# Patient Record
Sex: Female | Born: 1950 | Race: White | Hispanic: No | Marital: Married | Smoking: Never smoker
Health system: Southern US, Community
[De-identification: ages and names within clinical notes are randomized; demographics above are authoritative.]

## PROBLEM LIST (undated history)

## (undated) DIAGNOSIS — I10 Essential (primary) hypertension: Secondary | ICD-10-CM

## (undated) HISTORY — DX: Essential (primary) hypertension: I10

---

## 1998-12-04 ENCOUNTER — Emergency Department (HOSPITAL_COMMUNITY): Admission: EM | Admit: 1998-12-04 | Discharge: 1998-12-04 | Payer: Self-pay | Admitting: Emergency Medicine

## 2007-05-13 ENCOUNTER — Emergency Department (HOSPITAL_COMMUNITY): Admission: EM | Admit: 2007-05-13 | Discharge: 2007-05-13 | Payer: Self-pay | Admitting: Emergency Medicine

## 2007-06-17 ENCOUNTER — Ambulatory Visit: Payer: Self-pay

## 2007-06-17 ENCOUNTER — Encounter: Payer: Self-pay | Admitting: Cardiology

## 2010-12-27 NOTE — Assessment & Plan Note (Signed)
[facility]                            CARDIOLOGY OFFICE NOTE   Cindy, Figueroa                     MRN:          [id number]  DATE:06/10/2007                            DOB:          [birth year]    REFERRING PHYSICIAN:  Wilmon Arms. Tsuei, M.D.   REASON FOR CONSULTATION:  Preoperative evaluation.   HISTORY OF PRESENT ILLNESS:  Cindy Figueroa is a 60 year old woman  previously followed by Dr. Nicki Guadalajara with [facility]. She states she was last seen in 2005.  I do not have any of  her cardiac records as yet, but she states that she had a minor heart  attack back in 2000 while visiting in [place], [place]. She states  that she underwent a cardiac catheterization at that time and reportedly  had no significant blockages.  She states that her heart rate was felt  to be due to stress and that she was initially treated with medical  therapy including Plavix,  Pravachol, metoprolol, and Univasc.  She  reports having some hives on PLAVIX and PRAVACHOL and was ultimately  taken off these medicines, and furthermore, she weaned  herself off of  the remaining drugs.  She now takes an over-the-counter oral chelation  agent and has had no regular cardiology followup.   [name] is being considered for an elective cholecystectomy.  She  was noted to have a heart murmur on examination and is referred to [place]  today.  She denies having any exertional chest pain or breathlessness  and has had no sense of palpitations, dizziness, or syncope. Her  electrocardiogram today is normal showing sinus rhythm at 80 beats per  minute.  Today I had a fairly frank discussion with her regarding her  anticipation for further cardiovascular evaluation and management.  She  prefers her current course of action including therapy with chelation  and is not of a mind that she would want to continue with any other  directed medical therapy, specifically as it  relates to high blood  pressure management, cholesterol management, and general risk factor  modification for cardiovascular disease.  She seems very motivated to  have the surgery done and, therefore, is willing to consider baseline  cardiovascular testing and even potentially a temporary perioperative  beta blocker.   ALLERGIES:  No known drug allergies.   PRESENT MEDICATIONS:  1. TriCardia (oral chelation agent).  2. Promethazine p.r.n.  3. Oxycodone p.r.n.   PAST MEDICAL HISTORY:  Is as detailed above. She denies any longstanding  history of hypertension.  She is not aware of her cholesterol status.  She reports two previous cesarean sections in 1985 and 1989.   SOCIAL HISTORY:  The patient is married. She has two children.  She  works as a Engineer, drilling.  She denies any tobacco or alcohol  use.  She drinks 1-2 cups of caffeinated coffee a day.  She has been  using a stationary bicycle, riding anywhere from  3-5 miles 3 days a  week.   FAMILY HISTORY:  Reportedly the patient's father died in his 67s with  lung  cancer.  The patient's brother died in his 19s due to an  automobile accident.  No clearly defined premature cardiovascular  disease.   REVIEW OF SYSTEMS:  As in History of Present Illness, otherwise as  noted.   PHYSICAL EXAMINATION:  VITAL SIGNS:  Blood pressure 159/115 originally,  rechecked by me at 155/108, heart rate 80.  Weight is 258 pounds.  GENERAL:  This is an obese woman in no acute distress.  HEENT:  Conjunctivae and lids are normal.  Oropharynx is clear.  NECK:  Supple.  No elevated jugular venous pressure.  No loud bruits.  LUNGS:  Clear without labored breathing.  CARDIAC:  Regular rate and rhythm.  A soft 2/6 systolic murmur heard at  the base without radiation.  No mid systolic click or pericardial rub is  evident.  No S3 gallop.  ABDOMEN: Soft, nontender.  Normoactive bowel sounds.  EXTREMITIES:  Exhibit no pitting edema.  Some venous  stasis.  Distal  pulses are 2+.  SKIN:  Warm and dry.  MUSCULOSKELETAL:  No kyphosis noted.  NEUROPSYCHIATRIC:  Alert and oriented x3.   IMPRESSION AND RECOMMENDATIONS:  1. Preoperative evaluation in a 60 year old woman with possible      cardiovascular disease based on patient's description (no details      as yet available).  Resting electrocardiogram is normal.  I do note      that she is hypertensive today, and there is no recent baseline      lipid status.  She is overweight but reports regular exercise      without symptoms and has otherwise had no regular cardiovascular      evaluation following her reported event in 2000.  I have discussed      these issues with her, and my recommendation is a Cardiolite study      to evaluate for potential ischemia as well as an echocardiogram to      follow up on the cardiac murmur.  If these studies are low risk, I      would anticipate she should be able to proceed with planned      elective cholecystectomy.  In the meanwhile, I have also asked for      records regarding her reported cardiac catheterization in 2000.  I      have also provided a prescription for Toprol XL 50 mg daily at      least to be taken in the perioperative setting as additional      reduction in cardiovascular risk.  My sense in speaking with the      patient is that she will not be willing to take this long term and,      for that matter, I am not certain that she is going to be willing      to undergo any additional standard cardiovascular risk modification      strategies.  We will forward the results of her stress testing and      echocardiogram to her sugeon and also inform the patient of this.  2. Further plans to follow.    Jonelle Sidle, MD  Electronically Signed   SGM/MedQ  DD: 06/10/2007  DT: 06/10/2007  Job #: [id number]   cc:   Wilmon Arms. Tsuei, M.D.

## 2010-12-30 NOTE — Assessment & Plan Note (Signed)
[facility]                            CARDIOLOGY OFFICE NOTE   Cindy Figueroa, Cindy Figueroa                     MRN:          [id number]  DATE:06/20/2007                            DOB:          August 22, 1950    I saw Cindy Figueroa back on the 27th of October as part of a preoperative  evaluation. She reported a history of cardiovascular disease previously  followed by Dr. Nicki Guadalajara at [facility].  She is being considered for an elective cholecystectomy and was not  reporting any significant angina or limiting breathlessness. She had not  had any recent cardiac follow up or ischemic testing however. I referred  her for an echocardiogram which demonstrated an ejection fraction of 60%  without regional wall motion abnormalities, mild left ventricular  hypertrophy, and mild left atrial enlargement, as well as an adenosine  Myoview which showed no evidence of ischemia with an ejection fraction  of 66%. I also provided her with a prescription for a beta blocker to  start in the perioperative setting at her last visit.  She was fairly  hesitant to consider any standard therapies for cardiovascular disease,  but did state that she would take her beta blocker.   In in the interim, I also received records from [facility] which included the report of the patient's previous  cardiac catheterization from March 2000 at [facility]  in [place], [place]. 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   Based on all of this information and testing, Cindy Figueroa should be able  to proceed with her planned elective cholecystectomy with an acceptably  low perioperative risk of cardiac complication. I still recommend the  perioperative beta blocker and for that matter, I would still  recommend  basic risk factor modification strategies including medications,  although Cindy Figueroa made it fairly clear at her initial visit that she  was not interested in long term medical therapy. We can certainly see  her back to discuss this further as needed.     Jonelle Sidle, MD  Electronically Signed    SGM/MedQ  DD: 06/20/2007  DT: 06/21/2007  Job #: [id number]   cc:   Wilmon Arms. Tsuei, M.D.

## 2011-05-25 LAB — I-STAT 8, (EC8 V) (CONVERTED LAB)
Acid-base deficit: 2
BUN: 20
Bicarbonate: 26.8 — ABNORMAL HIGH
Chloride: 104
Glucose, Bld: 180 — ABNORMAL HIGH
HCT: 50 — ABNORMAL HIGH
Hemoglobin: 17 — ABNORMAL HIGH
Operator id: 285841
Potassium: 3.6
Sodium: 141
TCO2: 29
pCO2, Ven: 59.7 — ABNORMAL HIGH
pH, Ven: 7.261

## 2011-05-25 LAB — CBC
HCT: 47 — ABNORMAL HIGH
Hemoglobin: 15.8 — ABNORMAL HIGH
MCHC: 33.7
MCV: 86.5
Platelets: 335
RBC: 5.43 — ABNORMAL HIGH
RDW: 13.7
WBC: 8.8

## 2011-05-25 LAB — DIFFERENTIAL
Basophils Absolute: 0
Basophils Relative: 0
Eosinophils Absolute: 0.5
Eosinophils Relative: 5
Lymphocytes Relative: 27
Lymphs Abs: 2.4
Monocytes Absolute: 0.2
Monocytes Relative: 3
Neutro Abs: 5.8
Neutrophils Relative %: 65

## 2011-05-25 LAB — COMPREHENSIVE METABOLIC PANEL
ALT: 75 — ABNORMAL HIGH
AST: 35
Albumin: 3.5
Alkaline Phosphatase: 68
BUN: 19
CO2: 29
Calcium: 8.7
Chloride: 105
Creatinine, Ser: 0.72
GFR calc Af Amer: >60
GFR calc non Af Amer: >60
Glucose, Bld: 179 — ABNORMAL HIGH
Potassium: 3.5
Sodium: 139
Total Bilirubin: 0.6
Total Protein: 6

## 2011-05-25 LAB — LIPASE, BLOOD: Lipase: 182 — ABNORMAL HIGH

## 2011-05-25 LAB — POCT CARDIAC MARKERS
CKMB, poc: 1
Myoglobin, poc: 55.1
Operator id: 285841
Troponin i, poc: <0.05

## 2011-05-25 LAB — POCT I-STAT CREATININE
Creatinine, Ser: 0.7
Operator id: 285841

## 2016-09-13 DIAGNOSIS — X32XXXA Exposure to sunlight, initial encounter: Secondary | ICD-10-CM | POA: Diagnosis not present

## 2016-09-13 DIAGNOSIS — L570 Actinic keratosis: Secondary | ICD-10-CM | POA: Diagnosis not present

## 2016-09-13 DIAGNOSIS — D225 Melanocytic nevi of trunk: Secondary | ICD-10-CM | POA: Diagnosis not present

## 2016-12-01 DIAGNOSIS — M5432 Sciatica, left side: Secondary | ICD-10-CM | POA: Diagnosis not present

## 2016-12-01 DIAGNOSIS — M4726 Other spondylosis with radiculopathy, lumbar region: Secondary | ICD-10-CM | POA: Diagnosis not present

## 2016-12-01 DIAGNOSIS — M549 Dorsalgia, unspecified: Secondary | ICD-10-CM | POA: Diagnosis not present

## 2016-12-01 DIAGNOSIS — M5431 Sciatica, right side: Secondary | ICD-10-CM | POA: Diagnosis not present

## 2016-12-07 ENCOUNTER — Other Ambulatory Visit: Payer: Self-pay | Admitting: Orthopedic Surgery

## 2016-12-07 DIAGNOSIS — M4726 Other spondylosis with radiculopathy, lumbar region: Secondary | ICD-10-CM

## 2016-12-13 ENCOUNTER — Ambulatory Visit
Admission: RE | Admit: 2016-12-13 | Discharge: 2016-12-13 | Disposition: A | Discharge status: Home / Self Care | Payer: Medicare Other | Source: Ambulatory Visit | Attending: Orthopedic Surgery | Admitting: Orthopedic Surgery

## 2016-12-13 DIAGNOSIS — M48061 Spinal stenosis, lumbar region without neurogenic claudication: Secondary | ICD-10-CM | POA: Diagnosis not present

## 2016-12-13 DIAGNOSIS — M4726 Other spondylosis with radiculopathy, lumbar region: Secondary | ICD-10-CM

## 2016-12-15 DIAGNOSIS — M4726 Other spondylosis with radiculopathy, lumbar region: Secondary | ICD-10-CM | POA: Diagnosis not present

## 2016-12-15 DIAGNOSIS — M5432 Sciatica, left side: Secondary | ICD-10-CM | POA: Diagnosis not present

## 2016-12-15 DIAGNOSIS — M5431 Sciatica, right side: Secondary | ICD-10-CM | POA: Diagnosis not present

## 2016-12-26 ENCOUNTER — Ambulatory Visit (INDEPENDENT_AMBULATORY_CARE_PROVIDER_SITE_OTHER): Payer: Medicare Other | Admitting: Adult Health

## 2016-12-26 ENCOUNTER — Encounter: Payer: Self-pay | Admitting: Adult Health

## 2016-12-26 VITALS — BP 183/104 | HR 69 | Temp 98.1°F | Ht 66.5 in | Wt 216.0 lb

## 2016-12-26 DIAGNOSIS — Z87898 Personal history of other specified conditions: Secondary | ICD-10-CM | POA: Insufficient documentation

## 2016-12-26 DIAGNOSIS — R2689 Other abnormalities of gait and mobility: Secondary | ICD-10-CM

## 2016-12-26 DIAGNOSIS — Z8342 Family history of familial hypercholesterolemia: Secondary | ICD-10-CM | POA: Diagnosis not present

## 2016-12-26 DIAGNOSIS — E669 Obesity, unspecified: Secondary | ICD-10-CM

## 2016-12-26 DIAGNOSIS — R5383 Other fatigue: Secondary | ICD-10-CM

## 2016-12-26 DIAGNOSIS — Z23 Encounter for immunization: Secondary | ICD-10-CM | POA: Diagnosis not present

## 2016-12-26 DIAGNOSIS — I10 Essential (primary) hypertension: Secondary | ICD-10-CM

## 2016-12-26 DIAGNOSIS — Z8639 Personal history of other endocrine, nutritional and metabolic disease: Secondary | ICD-10-CM | POA: Diagnosis not present

## 2016-12-26 DIAGNOSIS — E559 Vitamin D deficiency, unspecified: Secondary | ICD-10-CM | POA: Diagnosis not present

## 2016-12-26 MED ORDER — BENAZEPRIL-HYDROCHLOROTHIAZIDE 10-12.5 MG PO TABS: 1.0000 | ORAL_TABLET | Freq: Every day | ORAL | 1 refills | Status: DC

## 2016-12-26 NOTE — Progress Notes (Signed)
Subjective:    Patient ID: Cindy Figueroa, female    DOB: [birth year], 66 y.o.   MRN: [id number]  HPI:  Cindy Figueroa is here to establish as a new pt.  She is a very pleasant 66 year old. PMH: HTN, anxiety/stress,and pinched nerve in back that is causing bil lower extremity pain that radiates to top of knees (sx's began 2015).  Last contact with PCP care was 2008 when she had cholecystectomy.  She was started on Toprol 50mg  in 2008, then weaned herself off a few years later.  She started a vegetarian diet 2 years ago and has lost 32 lbs.  She drinks plenty of water and does not use tobacco or ETOH.  The pinched nerve and lower ext pain dramatically increased Nov 20178 after a long road trip, that required her to enter/exit a [place] frequently. She recently started tx with "[facility]" [phone] a few months ago and requires BP control prior to injection therapy and/or surgical interventions.  She denies acute pain at OV today.  Patient Care Team    Relationship Specialty Notifications Start End  Lillard Anes D, NP PCP - General Family Medicine  12/26/16   Allyn Kenner, MD  Dermatology  12/26/16   Starling Manns, MD  Orthopedic Surgery  12/26/16     Patient Active Problem List   Diagnosis Date Noted  . HTN, goal below 150/90 12/26/2016     Past Medical History:  Diagnosis Date  . Hypertension      History reviewed. No pertinent surgical history.   Family History  Problem Relation Age of Onset  . Hyperlipidemia Mother   . Hypertension Mother   . Cancer Father   . Emphysema Father      History  Drug Use No     History  Alcohol Use No     History  Smoking Status  . Never Smoker  Smokeless Tobacco  . Never Used     Outpatient Encounter Prescriptions as of 12/26/2016  Medication Sig  . gabapentin (NEURONTIN) 300 MG capsule TAKE 1 CAPSULE BY ORAL ROUTE 2 TIMES EVERY DAY  . OVER THE COUNTER MEDICATION   . benazepril-hydrochlorthiazide (LOTENSIN HCT)  10-12.5 MG tablet Take 1 tablet by mouth daily.   No facility-administered encounter medications on file as of 12/26/2016.     Allergies: Penicillins  Body mass index is 34.34 kg/m.  Blood pressure (!) 172/85, pulse 73, temperature 98.1 F (36.7 C), height 5' 6.5" (1.689 m), weight 216 lb (98 kg), SpO2 96 %.     Review of Systems  Constitutional: Positive for activity change and fatigue. Negative for appetite change, chills, diaphoresis, fever and unexpected weight change.  HENT: Negative for congestion.   Eyes: Negative for visual disturbance.  Respiratory: Negative for cough, chest tightness, shortness of breath, wheezing and stridor.   Cardiovascular: Negative for chest pain, palpitations and leg swelling.  Gastrointestinal: Negative for abdominal distention, abdominal pain, blood in stool, constipation, diarrhea, nausea and vomiting.  Endocrine: Negative for cold intolerance, heat intolerance, polydipsia, polyphagia and polyuria.  Genitourinary: Negative for difficulty urinating, flank pain and hematuria.  Musculoskeletal: Positive for arthralgias, back pain, gait problem, joint swelling and myalgias. Negative for neck pain and neck stiffness.  Skin: Negative for color change, pallor, rash and wound.  Allergic/Immunologic: Negative for immunocompromised state.  Neurological: Negative for dizziness, tremors, weakness, light-headedness and headaches.  Hematological: Does not bruise/bleed easily.  Psychiatric/Behavioral: Negative for agitation, behavioral problems, dysphoric mood and sleep disturbance.  The patient is nervous/anxious.        Objective:   Physical Exam  Constitutional: She is oriented to person, place, and time. She appears well-developed and well-nourished. No distress.  HENT:  Head: Normocephalic and atraumatic.  Right Ear: Hearing, tympanic membrane, external ear and ear canal normal. No decreased hearing is noted.  Left Ear: Hearing, tympanic membrane,  external ear and ear canal normal. No decreased hearing is noted.  Nose: Nose normal. Right sinus exhibits no maxillary sinus tenderness and no frontal sinus tenderness. Left sinus exhibits no maxillary sinus tenderness and no frontal sinus tenderness.  Mouth/Throat: Uvula is midline.  Eyes: Conjunctivae are normal. Pupils are equal, round, and reactive to light.  Neck: Normal range of motion. Neck supple.  Cardiovascular: Normal rate, regular rhythm, normal heart sounds and intact distal pulses.   No murmur heard. Pulmonary/Chest: Effort normal and breath sounds normal. No respiratory distress. She has no wheezes. She has no rales. She exhibits no tenderness.  Abdominal: Soft. Bowel sounds are normal. She exhibits no distension and no mass. There is no tenderness. There is no rebound and no guarding.  Musculoskeletal: Normal range of motion. She exhibits no edema or tenderness.       Lumbar back: She exhibits normal range of motion and no tenderness.       Right upper leg: She exhibits no tenderness.       Left upper leg: She exhibits no tenderness.  No pain at encounter today.  Lymphadenopathy:    She has no cervical adenopathy.  Neurological: She is alert and oriented to person, place, and time. Coordination normal.  Skin: Skin is warm and dry. No rash noted. She is not diaphoretic. No erythema. No pallor.  Psychiatric: She has a normal mood and affect. Her behavior is normal. Judgment and thought content normal.  Nursing note and vitals reviewed.         Assessment & Plan:   1. Family history of high cholesterol   2. Fatigue, unspecified type   3. Morbid obesity ([place])   4. H/O fatigue   5. Poor balance   6. HTN, goal below 150/90     HTN, goal below 150/90 Orthostatic BP check-negative for drop in pressure. She is well above goal of 150/90, candidate for initial combination therapy. Started on Benazepril/Hydrochlorthiazide 10/12.5. She will check BP and HR BID, RTC 3  weeks, sooner if needed. Follows low Na+ diet already. Unable to exercise due to pinched nerve in back/bil leg pain. Denies personal/family hx of CAD/MI.     FOLLOW-UP:  Return in about 3 weeks (around 01/16/2017) for Regular Follow Up, HTN.

## 2016-12-26 NOTE — Patient Instructions (Addendum)

## 2016-12-26 NOTE — Assessment & Plan Note (Signed)
Follow vegetarian diet. Checked lipid panel today.

## 2016-12-26 NOTE — Assessment & Plan Note (Signed)
Orthostatic BP check-negative for drop in pressure. She is well above goal of 150/90, candidate for initial combination therapy. Started on Benazepril/Hydrochlorthiazide 10/12.5. She will check BP and HR BID, RTC 3 weeks, sooner if needed. Follows low Na+ diet already. Unable to exercise due to pinched nerve in back/bil leg pain. Denies personal/family hx of CAD/MI.

## 2016-12-26 NOTE — Assessment & Plan Note (Signed)
Checked TSH, Vit d, B12 levels.

## 2016-12-27 ENCOUNTER — Other Ambulatory Visit: Payer: Self-pay | Admitting: Adult Health

## 2016-12-27 ENCOUNTER — Telehealth: Payer: Self-pay

## 2016-12-27 DIAGNOSIS — E559 Vitamin D deficiency, unspecified: Secondary | ICD-10-CM

## 2016-12-27 LAB — CBC
Hematocrit: 44.1 % (ref 34.0–46.6)
Hemoglobin: 14.6 g/dL (ref 11.1–15.9)
MCH: 28.6 pg (ref 26.6–33.0)
MCHC: 33.1 g/dL (ref 31.5–35.7)
MCV: 87 fL (ref 79–97)
Platelets: 270 10*3/uL (ref 150–379)
RBC: 5.1 x10E6/uL (ref 3.77–5.28)
RDW: 13.5 % (ref 12.3–15.4)
WBC: 6.8 10*3/uL (ref 3.4–10.8)

## 2016-12-27 LAB — LIPID PANEL
Chol/HDL Ratio: 4.2 ratio (ref 0.0–4.4)
Cholesterol, Total: 211 mg/dL — ABNORMAL HIGH (ref 100–199)
HDL: 50 mg/dL (ref >39)
LDL Calculated: 137 mg/dL — ABNORMAL HIGH (ref 0–99)
Triglycerides: 121 mg/dL (ref 0–149)
VLDL Cholesterol Cal: 24 mg/dL (ref 5–40)

## 2016-12-27 LAB — COMPREHENSIVE METABOLIC PANEL
ALT: 27 IU/L (ref 0–32)
AST: 17 IU/L (ref 0–40)
Albumin/Globulin Ratio: 2.6 — ABNORMAL HIGH (ref 1.2–2.2)
Albumin: 4.6 g/dL (ref 3.6–4.8)
Alkaline Phosphatase: 68 IU/L (ref 39–117)
BUN/Creatinine Ratio: 18 (ref 12–28)
BUN: 10 mg/dL (ref 8–27)
Bilirubin Total: 0.7 mg/dL (ref 0.0–1.2)
CO2: 26 mmol/L (ref 18–29)
Calcium: 9.4 mg/dL (ref 8.7–10.3)
Chloride: 103 mmol/L (ref 96–106)
Creatinine, Ser: 0.55 mg/dL — ABNORMAL LOW (ref 0.57–1.00)
GFR calc Af Amer: 113 mL/min/{1.73_m2} (ref >59)
GFR calc non Af Amer: 98 mL/min/{1.73_m2} (ref >59)
Globulin, Total: 1.8 g/dL (ref 1.5–4.5)
Glucose: 95 mg/dL (ref 65–99)
Potassium: 5.1 mmol/L (ref 3.5–5.2)
Sodium: 145 mmol/L — ABNORMAL HIGH (ref 134–144)
Total Protein: 6.4 g/dL (ref 6.0–8.5)

## 2016-12-27 LAB — VITAMIN D 25 HYDROXY (VIT D DEFICIENCY, FRACTURES): Vit D, 25-Hydroxy: 24.2 ng/mL — ABNORMAL LOW (ref 30.0–100.0)

## 2016-12-27 LAB — HEMOGLOBIN A1C
Est. average glucose Bld gHb Est-mCnc: 103 mg/dL
Hgb A1c MFr Bld: 5.2 % (ref 4.8–5.6)

## 2016-12-27 LAB — VITAMIN B12: Vitamin B-12: 472 pg/mL (ref 232–1245)

## 2016-12-27 LAB — TSH: TSH: 1.53 u[IU]/mL (ref 0.450–4.500)

## 2016-12-27 MED ORDER — VITAMIN D (ERGOCALCIFEROL) 1.25 MG (50000 UNIT) PO CAPS: 50000.0000 [IU] | ORAL_CAPSULE | ORAL | 0 refills | Status: DC

## 2016-12-27 NOTE — Telephone Encounter (Signed)
-----   Message from Odella Aquas, NP sent at 12/27/2016  8:29 AM EDT ----- Kermit Balo Morning [place]rene [place], Can you please call Ms. Burson and share that her labs were essentially WNL, exception: Tot chol 211 (normal <199), LDL 137 (normal <99)-I know her pinched nerve is preventing her from exercising regularly, please try increase regular movement as tolerated (i.e. Seated weight workouts, stationary bike, light yoga). Thyroid and A1c normal. Overall GOOD JOB! Please keep recording BP and HR BID and return in 3 weeks for HTN re-eval. Sincerely. Valetta Fuller

## 2016-12-27 NOTE — Telephone Encounter (Signed)
Informed patient of lab results and recommendations.  She asked about morbid obesity diagnosis, explained to her the BMI chart and after discussion with Lillard Anes, diagnosis was changed to just obesity.  Informed patient of change.

## 2017-01-04 DIAGNOSIS — M4726 Other spondylosis with radiculopathy, lumbar region: Secondary | ICD-10-CM | POA: Diagnosis not present

## 2017-01-04 DIAGNOSIS — M5416 Radiculopathy, lumbar region: Secondary | ICD-10-CM | POA: Diagnosis not present

## 2017-01-11 DIAGNOSIS — E559 Vitamin D deficiency, unspecified: Secondary | ICD-10-CM | POA: Insufficient documentation

## 2017-01-17 ENCOUNTER — Encounter: Payer: Self-pay | Admitting: Adult Health

## 2017-01-17 ENCOUNTER — Ambulatory Visit (INDEPENDENT_AMBULATORY_CARE_PROVIDER_SITE_OTHER): Payer: Medicare Other | Admitting: Adult Health

## 2017-01-17 VITALS — BP 130/81 | HR 72 | Ht 65.0 in | Wt 213.0 lb

## 2017-01-17 DIAGNOSIS — G589 Mononeuropathy, unspecified: Secondary | ICD-10-CM

## 2017-01-17 DIAGNOSIS — I10 Essential (primary) hypertension: Secondary | ICD-10-CM | POA: Diagnosis not present

## 2017-01-17 MED ORDER — BENAZEPRIL-HYDROCHLOROTHIAZIDE 10-12.5 MG PO TABS: 1.0000 | ORAL_TABLET | Freq: Every day | ORAL | 4 refills | Status: DC

## 2017-01-17 NOTE — Assessment & Plan Note (Addendum)
BP at goal Continue Lotensin 10/12.5 mg Has lost 3 lbs since last OV 3 weeks ago. Continues to follow heart healthy diet and will increase walking/regular movement after hip injection next week. F/u 1 year, sooner if needed.

## 2017-01-17 NOTE — Patient Instructions (Signed)
Hypertension Hypertension, commonly called high blood pressure, is when the force of blood pumping through the arteries is too strong. The arteries are the blood vessels that carry blood from the heart throughout the body. Hypertension forces the heart to work harder to pump blood and may cause arteries to become narrow or stiff. Having untreated or uncontrolled hypertension can cause heart attacks, strokes, kidney disease, and other problems. A blood pressure reading consists of a higher number over a lower number. Ideally, your blood pressure should be below 120/80. The first ("top") number is called the systolic pressure. It is a measure of the pressure in your arteries as your heart beats. The second ("bottom") number is called the diastolic pressure. It is a measure of the pressure in your arteries as the heart relaxes. What are the causes? The cause of this condition is not known. What increases the risk? Some risk factors for high blood pressure are under your control. Others are not. Factors you can change  Smoking.  Having type 2 diabetes mellitus, high cholesterol, or both.  Not getting enough exercise or physical activity.  Being overweight.  Having too much fat, sugar, calories, or salt (sodium) in your diet.  Drinking too much alcohol. Factors that are difficult or impossible to change  Having chronic kidney disease.  Having a family history of high blood pressure.  Age. Risk increases with age.  Race. You may be at higher risk if you are African-American.  Gender. Men are at higher risk than women before age 45. After age 65, women are at higher risk than men.  Having obstructive sleep apnea.  Stress. What are the signs or symptoms? Extremely high blood pressure (hypertensive crisis) may cause:  Headache.  Anxiety.  Shortness of breath.  Nosebleed.  Nausea and vomiting.  Severe chest pain.  Jerky movements you cannot control (seizures).  How is this  diagnosed? This condition is diagnosed by measuring your blood pressure while you are seated, with your arm resting on a surface. The cuff of the blood pressure monitor will be placed directly against the skin of your upper arm at the level of your heart. It should be measured at least twice using the same arm. Certain conditions can cause a difference in blood pressure between your right and left arms. Certain factors can cause blood pressure readings to be lower or higher than normal (elevated) for a short period of time:  When your blood pressure is higher when you are in a health care provider's office than when you are at home, this is called white coat hypertension. Most people with this condition do not need medicines.  When your blood pressure is higher at home than when you are in a health care provider's office, this is called masked hypertension. Most people with this condition may need medicines to control blood pressure.  If you have a high blood pressure reading during one visit or you have normal blood pressure with other risk factors:  You may be asked to return on a different day to have your blood pressure checked again.  You may be asked to monitor your blood pressure at home for 1 week or longer.  If you are diagnosed with hypertension, you may have other blood or imaging tests to help your health care provider understand your overall risk for other conditions. How is this treated? This condition is treated by making healthy lifestyle changes, such as eating healthy foods, exercising more, and reducing your alcohol intake. Your   health care provider may prescribe medicine if lifestyle changes are not enough to get your blood pressure under control, and if:  Your systolic blood pressure is above 130.  Your diastolic blood pressure is above 80.  Your personal target blood pressure may vary depending on your medical conditions, your age, and other factors. Follow these  instructions at home: Eating and drinking  Eat a diet that is high in fiber and potassium, and low in sodium, added sugar, and fat. An example eating plan is called the DASH (Dietary Approaches to Stop Hypertension) diet. To eat this way: ? Eat plenty of fresh fruits and vegetables. Try to fill half of your plate at each meal with fruits and vegetables. ? Eat whole grains, such as whole wheat pasta, brown rice, or whole grain bread. Fill about one quarter of your plate with whole grains. ? Eat or drink low-fat dairy products, such as skim milk or low-fat yogurt. ? Avoid fatty cuts of meat, processed or cured meats, and poultry with skin. Fill about one quarter of your plate with lean proteins, such as fish, chicken without skin, beans, eggs, and tofu. ? Avoid premade and processed foods. These tend to be higher in sodium, added sugar, and fat.  Reduce your daily sodium intake. Most people with hypertension should eat less than 1,500 mg of sodium a day.  Limit alcohol intake to no more than 1 drink a day for nonpregnant women and 2 drinks a day for men. One drink equals 12 oz of beer, 5 oz of wine, or 1 oz of hard liquor. Lifestyle  Work with your health care provider to maintain a healthy body weight or to lose weight. Ask what an ideal weight is for you.  Get at least 30 minutes of exercise that causes your heart to beat faster (aerobic exercise) most days of the week. Activities may include walking, swimming, or biking.  Include exercise to strengthen your muscles (resistance exercise), such as pilates or lifting weights, as part of your weekly exercise routine. Try to do these types of exercises for 30 minutes at least 3 days a week.  Do not use any products that contain nicotine or tobacco, such as cigarettes and e-cigarettes. If you need help quitting, ask your health care provider.  Monitor your blood pressure at home as told by your health care provider.  Keep all follow-up visits as  told by your health care provider. This is important. Medicines  Take over-the-counter and prescription medicines only as told by your health care provider. Follow directions carefully. Blood pressure medicines must be taken as prescribed.  Do not skip doses of blood pressure medicine. Doing this puts you at risk for problems and can make the medicine less effective.  Ask your health care provider about side effects or reactions to medicines that you should watch for. Contact a health care provider if:  You think you are having a reaction to a medicine you are taking.  You have headaches that keep coming back (recurring).  You feel dizzy.  You have swelling in your ankles.  You have trouble with your vision. Get help right away if:  You develop a severe headache or confusion.  You have unusual weakness or numbness.  You feel faint.  You have severe pain in your chest or abdomen.  You vomit repeatedly.  You have trouble breathing. Summary  Hypertension is when the force of blood pumping through your arteries is too strong. If this condition is not   controlled, it may put you at risk for serious complications.  Your personal target blood pressure may vary depending on your medical conditions, your age, and other factors. For most people, a normal blood pressure is less than 120/80.  Hypertension is treated with lifestyle changes, medicines, or a combination of both. Lifestyle changes include weight loss, eating a healthy, low-sodium diet, exercising more, and limiting alcohol. This information is not intended to replace advice given to you by your health care provider. Make sure you discuss any questions you have with your health care provider. Document Released: 07/31/2005 Document Revised: 06/28/2016 Document Reviewed: 06/28/2016 Elsevier Interactive Patient Education  2018 [employer].    Heart-Healthy Eating Plan Many factors influence your heart health, including  eating and exercise habits. Heart (coronary) risk increases with abnormal blood fat (lipid) levels. Heart-healthy meal planning includes limiting unhealthy fats, increasing healthy fats, and making other small dietary changes. This includes maintaining a healthy body weight to help keep lipid levels within a normal range. What is my plan? Your health care provider recommends that you:  Get no more than _________% of the total calories in your daily diet from fat.  Limit your intake of saturated fat to less than _________% of your total calories each day.  Limit the amount of cholesterol in your diet to less than _________ mg per day.  What types of fat should I choose?  Choose healthy fats more often. Choose monounsaturated and polyunsaturated fats, such as olive oil and canola oil, flaxseeds, walnuts, almonds, and seeds.  Eat more omega-3 fats. Good choices include salmon, mackerel, sardines, tuna, flaxseed oil, and ground flaxseeds. Aim to eat fish at least two times each week.  Limit saturated fats. Saturated fats are primarily found in animal products, such as meats, butter, and cream. Plant sources of saturated fats include palm oil, palm kernel oil, and coconut oil.  Avoid foods with partially hydrogenated oils in them. These contain trans fats. Examples of foods that contain trans fats are stick margarine, some tub margarines, cookies, crackers, and other baked goods. What general guidelines do I need to follow?  Check food labels carefully to identify foods with trans fats or high amounts of saturated fat.  Fill one half of your plate with vegetables and green salads. Eat 4-5 servings of vegetables per day. A serving of vegetables equals 1 cup of raw leafy vegetables,  cup of raw or cooked cut-up vegetables, or  cup of vegetable juice.  Fill one fourth of your plate with whole grains. Look for the word "whole" as the first word in the ingredient list.  Fill one fourth of your  plate with lean protein foods.  Eat 4-5 servings of fruit per day. A serving of fruit equals one medium whole fruit,  cup of dried fruit,  cup of fresh, frozen, or canned fruit, or  cup of 100% fruit juice.  Eat more foods that contain soluble fiber. Examples of foods that contain this type of fiber are apples, broccoli, carrots, beans, peas, and barley. Aim to get 20-30 g of fiber per day.  Eat more home-cooked food and less restaurant, buffet, and fast food.  Limit or avoid alcohol.  Limit foods that are high in starch and sugar.  Avoid fried foods.  Cook foods by using methods other than frying. Baking, boiling, grilling, and broiling are all great options. Other fat-reducing suggestions include: ? Removing the skin from poultry. ? Removing all visible fats from meats. ? Skimming the fat  off of stews, soups, and gravies before serving them. ? Steaming vegetables in water or broth.  Lose weight if you are overweight. Losing just 5-10% of your initial body weight can help your overall health and prevent diseases such as diabetes and heart disease.  Increase your consumption of nuts, legumes, and seeds to 4-5 servings per week. One serving of dried beans or legumes equals  cup after being cooked, one serving of nuts equals 1 ounces, and one serving of seeds equals  ounce or 1 tablespoon.  You may need to monitor your salt (sodium) intake, especially if you have high blood pressure. Talk with your health care provider or dietitian to get more information about reducing sodium. What foods can I eat? Grains  Breads, including [place], white, pita, wheat, raisin, rye, oatmeal, and [place]. Tortillas that are neither fried nor made with lard or trans fat. Low-fat rolls, including hotdog and hamburger buns and English muffins. Biscuits. Muffins. Waffles. Pancakes. Light popcorn. Whole-grain cereals. Flatbread. Melba toast. Pretzels. Breadsticks. Rusks. Low-fat snacks and crackers,  including oyster, saltine, matzo, graham, animal, and rye. Rice and pasta, including brown rice and those that are made with whole wheat. Vegetables All vegetables. Fruits All fruits, but limit coconut. Meats and Other Protein Sources Lean, well-trimmed beef, veal, pork, and lamb. Chicken and [place] without skin. All fish and shellfish. Wild duck, rabbit, pheasant, and venison. Egg whites or low-cholesterol egg substitutes. Dried beans, peas, lentils, and tofu.Seeds and most nuts. Dairy Low-fat or nonfat cheeses, including ricotta, string, and mozzarella. Skim or 1% milk that is liquid, powdered, or evaporated. Buttermilk that is made with low-fat milk. Nonfat or low-fat yogurt. Beverages Mineral water. Diet carbonated beverages. Sweets and Desserts Sherbets and fruit ices. Honey, jam, marmalade, jelly, and syrups. Meringues and gelatins. Pure sugar candy, such as hard candy, jelly beans, gumdrops, mints, marshmallows, and small amounts of dark chocolate. [employer] Eat all sweets and desserts in moderation. Fats and Oils Nonhydrogenated (trans-free) margarines. Vegetable oils, including soybean, sesame, sunflower, olive, peanut, safflower, corn, canola, and cottonseed. Salad dressings or mayonnaise that are made with a vegetable oil. Limit added fats and oils that you use for cooking, baking, salads, and as spreads. Other Cocoa powder. Coffee and tea. All seasonings and condiments. The items listed above may not be a complete list of recommended foods or beverages. Contact your dietitian for more options. What foods are not recommended? Grains Breads that are made with saturated or trans fats, oils, or whole milk. Croissants. Butter rolls. Cheese breads. Sweet rolls. Donuts. Buttered popcorn. Chow mein noodles. High-fat crackers, such as cheese or butter crackers. Meats and Other Protein Sources Fatty meats, such as hotdogs, short ribs, sausage, spareribs, bacon, ribeye roast or steak,  and mutton. High-fat deli meats, such as salami and bologna. Caviar. Domestic duck and goose. Organ meats, such as kidney, liver, sweetbreads, brains, gizzard, chitterlings, and heart. Dairy Cream, sour cream, cream cheese, and creamed cottage cheese. Whole milk cheeses, including blue (bleu), Monterey Jack, [place], [place], [place], [place], [place], [place], [place], and [place]. Whole or 2% milk that is liquid, evaporated, or condensed. Whole buttermilk. Cream sauce or high-fat cheese sauce. Yogurt that is made from whole milk. Beverages Regular sodas and drinks with added sugar. Sweets and Desserts Frosting. Pudding. Cookies. Cakes other than angel food cake. Candy that has milk chocolate or white chocolate, hydrogenated fat, butter, coconut, or unknown ingredients. Buttered syrups. Full-fat ice cream or ice cream drinks. Fats and Oils Gravy that has  suet, meat fat, or shortening. Cocoa butter, hydrogenated oils, palm oil, coconut oil, palm kernel oil. These can often be found in baked products, candy, fried foods, nondairy creamers, and whipped toppings. Solid fats and shortenings, including bacon fat, salt pork, lard, and butter. Nondairy cream substitutes, such as coffee creamers and sour cream substitutes. Salad dressings that are made of unknown oils, cheese, or sour cream. The items listed above may not be a complete list of foods and beverages to avoid. Contact your dietitian for more information. This information is not intended to replace advice given to you by your health care provider. Make sure you discuss any questions you have with your health care provider.  Continue Lotesin 10/12.5 and eating healthy. Once pinched nerve pain is successfully treated with injection therapy, increase regular movement-PT referral/aquatic therapy referral placed. Annual follow-up, sooner if needed. GREAT JOB! Document Released: 05/09/2008 Document Revised: 02/18/2016 Document Reviewed:  01/22/2014 Elsevier Interactive Patient Education  2017 [employer].

## 2017-01-17 NOTE — Progress Notes (Addendum)
Subjective:    Patient ID: Cindy Figueroa, female    DOB: Nov 16, 1950, 66 y.o.   MRN: [id number]  HPI:  [name] presents for f/u: HTN.  She has been taking Lotensin 10/12.5mg  daily without SE.  She continues to follow heart healthy diet and has last 3 lbs since last OV 3 weeks ago.  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  After injection therapy she plans in increasing her daily movement-walking, seated weight exercise.  We discussed water aerobics and she is interested in PT referral.  Home BP readings SBP 120-140s, DBP 60-90, with the last 7 days consistently <130/70. Today BP 130/81   Patient Care Team    Relationship Specialty Notifications Start End  Mina Marble D, NP PCP - General Family Medicine  12/26/16   Allyn Kenner, MD  Dermatology  12/26/16   Starling Manns, MD  Orthopedic Surgery  12/26/16     Patient Active Problem List   Diagnosis Date Noted  . Vitamin D deficiency 01/11/2017  . HTN (hypertension) 12/26/2016  . Poor balance 12/26/2016  . H/O fatigue 12/26/2016  . Family history of high cholesterol 12/26/2016     Past Medical History:  Diagnosis Date  . Hypertension      History reviewed. No pertinent surgical history.   Family History  Problem Relation Age of Onset  . Hyperlipidemia Mother   . Hypertension Mother   . Cancer Father   . Emphysema Father      History  Drug Use No     History  Alcohol Use No     History  Smoking Status  . Never Smoker  Smokeless Tobacco  . Never Used     Outpatient Encounter Prescriptions as of 01/17/2017  Medication Sig  . benazepril-hydrochlorthiazide (LOTENSIN HCT) 10-12.5 MG tablet Take 1 tablet by mouth daily.  Marland Kitchen gabapentin (NEURONTIN) 300 MG capsule TAKE 1 CAPSULE BY ORAL ROUTE 2 TIMES EVERY DAY  . OVER THE COUNTER MEDICATION   . [DISCONTINUED] benazepril-hydrochlorthiazide (LOTENSIN HCT) 10-12.5 MG tablet Take 1 tablet by mouth daily.   . [DISCONTINUED] Vitamin D, Ergocalciferol, (DRISDOL) 50000 units CAPS capsule Take 1 capsule (50,000 Units total) by mouth every 7 (seven) days.   No facility-administered encounter medications on file as of 01/17/2017.     Allergies: Penicillins and Plavix [clopidogrel bisulfate]  Body mass index is 35.45 kg/m.  Blood pressure 130/81, pulse 72, height 5\' 5"  (1.651 m), weight 213 lb (96.6 kg).    Review of Systems  Constitutional: Negative for activity change, appetite change, chills, diaphoresis, fatigue, fever and unexpected weight change.  Respiratory: Negative for cough, chest tightness, shortness of breath, wheezing and stridor.   Cardiovascular: Negative for chest pain, palpitations and leg swelling.  Gastrointestinal: Negative for abdominal distention, abdominal pain, constipation, diarrhea, nausea and vomiting.  Endocrine: Negative for cold intolerance, heat intolerance, polydipsia, polyphagia and polyuria.  Skin: Negative for color change, pallor, rash and wound.  Hematological: Does not bruise/bleed easily.       Objective:   Physical Exam  Constitutional: She is oriented to person, place, and time. She appears well-developed and well-nourished. No distress.  Neck: Normal range of motion. Neck supple.  Cardiovascular: Normal rate, regular rhythm, normal heart sounds and intact distal pulses.   No murmur heard. Pulmonary/Chest: Effort normal and breath sounds normal. No respiratory distress. She has no wheezes. She has no rales. She exhibits no tenderness.  Lymphadenopathy:  She has no cervical adenopathy.  Neurological: She is alert and oriented to person, place, and time. Coordination normal.  Skin: Skin is warm and dry. No rash noted. She is not diaphoretic. No erythema. No pallor.  Psychiatric: She has a normal mood and affect. Her behavior is normal. Judgment and thought content normal.  Nursing note and vitals reviewed.         Assessment & Plan:   1.  Pinched nerve   2. Essential hypertension     HTN (hypertension) BP at goal Continue Lotensin 10/12.5 mg Has lost 3 lbs since last OV 3 weeks ago. Continues to follow heart healthy diet and will increase walking/regular movement after hip injection next week. F/u 1 year, sooner if needed.    FOLLOW-UP:  Return in about 1 year (around 01/17/2018) for Regular Follow Up, HTN.

## 2017-01-22 DIAGNOSIS — M5416 Radiculopathy, lumbar region: Secondary | ICD-10-CM | POA: Diagnosis not present

## 2017-02-19 DIAGNOSIS — M5416 Radiculopathy, lumbar region: Secondary | ICD-10-CM | POA: Diagnosis not present

## 2017-03-13 ENCOUNTER — Ambulatory Visit: Payer: Medicare Other | Admitting: Adult Health

## 2017-03-13 ENCOUNTER — Encounter: Payer: Self-pay | Admitting: Adult Health

## 2017-03-13 ENCOUNTER — Ambulatory Visit (INDEPENDENT_AMBULATORY_CARE_PROVIDER_SITE_OTHER): Payer: Medicare Other | Admitting: Adult Health

## 2017-03-13 DIAGNOSIS — E785 Hyperlipidemia, unspecified: Secondary | ICD-10-CM | POA: Insufficient documentation

## 2017-03-13 DIAGNOSIS — E669 Obesity, unspecified: Secondary | ICD-10-CM | POA: Insufficient documentation

## 2017-03-13 DIAGNOSIS — I10 Essential (primary) hypertension: Secondary | ICD-10-CM

## 2017-03-13 MED ORDER — PHENTERMINE HCL 15 MG PO CAPS: 15.0000 mg | ORAL_CAPSULE | ORAL | 0 refills | Status: DC

## 2017-03-13 MED ORDER — BENAZEPRIL-HYDROCHLOROTHIAZIDE 20-25 MG PO TABS: 1.0000 | ORAL_TABLET | Freq: Every day | ORAL | Status: DC

## 2017-03-13 NOTE — Patient Instructions (Addendum)
Heart-Healthy Eating Plan Many factors influence your heart health, including eating and exercise habits. Heart (coronary) risk increases with abnormal blood fat (lipid) levels. Heart-healthy meal planning includes limiting unhealthy fats, increasing healthy fats, and making other small dietary changes. This includes maintaining a healthy body weight to help keep lipid levels within a normal range. What is my plan? Your health care provider recommends that you:  Get no more than _________% of the total calories in your daily diet from fat.  Limit your intake of saturated fat to less than _________% of your total calories each day.  Limit the amount of cholesterol in your diet to less than _________ mg per day.  What types of fat should I choose?  Choose healthy fats more often. Choose monounsaturated and polyunsaturated fats, such as olive oil and canola oil, flaxseeds, walnuts, almonds, and seeds.  Eat more omega-3 fats. Good choices include salmon, mackerel, sardines, tuna, flaxseed oil, and ground flaxseeds. Aim to eat fish at least two times each week.  Limit saturated fats. Saturated fats are primarily found in animal products, such as meats, butter, and cream. Plant sources of saturated fats include palm oil, palm kernel oil, and coconut oil.  Avoid foods with partially hydrogenated oils in them. These contain trans fats. Examples of foods that contain trans fats are stick margarine, some tub margarines, cookies, crackers, and other baked goods. What general guidelines do I need to follow?  Check food labels carefully to identify foods with trans fats or high amounts of saturated fat.  Fill one half of your plate with vegetables and green salads. Eat 4-5 servings of vegetables per day. A serving of vegetables equals 1 cup of raw leafy vegetables,  cup of raw or cooked cut-up vegetables, or  cup of vegetable juice.  Fill one fourth of your plate with whole grains. Look for the word  "whole" as the first word in the ingredient list.  Fill one fourth of your plate with lean protein foods.  Eat 4-5 servings of fruit per day. A serving of fruit equals one medium whole fruit,  cup of dried fruit,  cup of fresh, frozen, or canned fruit, or  cup of 100% fruit juice.  Eat more foods that contain soluble fiber. Examples of foods that contain this type of fiber are apples, broccoli, carrots, beans, peas, and barley. Aim to get 20-30 g of fiber per day.  Eat more home-cooked food and less restaurant, buffet, and fast food.  Limit or avoid alcohol.  Limit foods that are high in starch and sugar.  Avoid fried foods.  Cook foods by using methods other than frying. Baking, boiling, grilling, and broiling are all great options. Other fat-reducing suggestions include: ? Removing the skin from poultry. ? Removing all visible fats from meats. ? Skimming the fat off of stews, soups, and gravies before serving them. ? Steaming vegetables in water or broth.  Lose weight if you are overweight. Losing just 5-10% of your initial body weight can help your overall health and prevent diseases such as diabetes and heart disease.  Increase your consumption of nuts, legumes, and seeds to 4-5 servings per week. One serving of dried beans or legumes equals  cup after being cooked, one serving of nuts equals 1 ounces, and one serving of seeds equals  ounce or 1 tablespoon.  You may need to monitor your salt (sodium) intake, especially if you have high blood pressure. Talk with your health care provider or dietitian to get  more information about reducing sodium. What foods can I eat? Grains  Breads, including [place], white, pita, wheat, raisin, rye, oatmeal, and [place]. Tortillas that are neither fried nor made with lard or trans fat. Low-fat rolls, including hotdog and hamburger buns and English muffins. Biscuits. Muffins. Waffles. Pancakes. Light popcorn. Whole-grain cereals. Flatbread.  Melba toast. Pretzels. Breadsticks. Rusks. Low-fat snacks and crackers, including oyster, saltine, matzo, graham, animal, and rye. Rice and pasta, including brown rice and those that are made with whole wheat. Vegetables All vegetables. Fruits All fruits, but limit coconut. Meats and Other Protein Sources Lean, well-trimmed beef, veal, pork, and lamb. Chicken and [place] without skin. All fish and shellfish. Wild duck, rabbit, pheasant, and venison. Egg whites or low-cholesterol egg substitutes. Dried beans, peas, lentils, and tofu.Seeds and most nuts. Dairy Low-fat or nonfat cheeses, including ricotta, string, and mozzarella. Skim or 1% milk that is liquid, powdered, or evaporated. Buttermilk that is made with low-fat milk. Nonfat or low-fat yogurt. Beverages Mineral water. Diet carbonated beverages. Sweets and Desserts Sherbets and fruit ices. Honey, jam, marmalade, jelly, and syrups. Meringues and gelatins. Pure sugar candy, such as hard candy, jelly beans, gumdrops, mints, marshmallows, and small amounts of dark chocolate. [employer] Eat all sweets and desserts in moderation. Fats and Oils Nonhydrogenated (trans-free) margarines. Vegetable oils, including soybean, sesame, sunflower, olive, peanut, safflower, corn, canola, and cottonseed. Salad dressings or mayonnaise that are made with a vegetable oil. Limit added fats and oils that you use for cooking, baking, salads, and as spreads. Other Cocoa powder. Coffee and tea. All seasonings and condiments. The items listed above may not be a complete list of recommended foods or beverages. Contact your dietitian for more options. What foods are not recommended? Grains Breads that are made with saturated or trans fats, oils, or whole milk. Croissants. Butter rolls. Cheese breads. Sweet rolls. Donuts. Buttered popcorn. Chow mein noodles. High-fat crackers, such as cheese or butter crackers. Meats and Other Protein Sources Fatty meats, such  as hotdogs, short ribs, sausage, spareribs, bacon, ribeye roast or steak, and mutton. High-fat deli meats, such as salami and bologna. Caviar. Domestic duck and goose. Organ meats, such as kidney, liver, sweetbreads, brains, gizzard, chitterlings, and heart. Dairy Cream, sour cream, cream cheese, and creamed cottage cheese. Whole milk cheeses, including blue (bleu), Monte[place], [place], [place], [place], [place], [place], [place], [place], and [place]. Whole or 2% milk that is liquid, evaporated, or condensed. Whole buttermilk. Cream sauce or high-fat cheese sauce. Yogurt that is made from whole milk. Beverages Regular sodas and drinks with added sugar. Sweets and Desserts Frosting. Pudding. Cookies. Cakes other than angel food cake. Candy that has milk chocolate or white chocolate, hydrogenated fat, butter, coconut, or unknown ingredients. Buttered syrups. Full-fat ice cream or ice cream drinks. Fats and Oils Gravy that has suet, meat fat, or shortening. Cocoa butter, hydrogenated oils, palm oil, coconut oil, palm kernel oil. These can often be found in baked products, candy, fried foods, nondairy creamers, and whipped toppings. Solid fats and shortenings, including bacon fat, salt pork, lard, and butter. Nondairy cream substitutes, such as coffee creamers and sour cream substitutes. Salad dressings that are made of unknown oils, cheese, or sour cream. The items listed above may not be a complete list of foods and beverages to avoid. Contact your dietitian for more information. This information is not intended to replace advice given to you by your health care provider. Make sure you discuss any questions you have with your health care  provider. Document Released: 05/09/2008 Document Revised: 02/18/2016 Document Reviewed: 01/22/2014 Elsevier Interactive Patient Education  2017 [employer].   Exercising to [employer] Exercising can help you to lose weight. In order to lose weight through exercise,  you need to do vigorous-intensity exercise. You can tell that you are exercising with vigorous intensity if you are breathing very hard and fast and cannot hold a conversation while exercising. Moderate-intensity exercise helps to maintain your current weight. You can tell that you are exercising at a moderate level if you have a higher heart rate and faster breathing, but you are still able to hold a conversation. How often should I exercise? Choose an activity that you enjoy and set realistic goals. Your health care provider can help you to make an activity plan that works for you. Exercise regularly as directed by your health care provider. This may include:  Doing resistance training twice each week, such as: ? Push-ups. ? Sit-ups. ? Lifting weights. ? Using resistance bands.  Doing a given intensity of exercise for a given amount of time. Choose from these options: ? 150 minutes of moderate-intensity exercise every week. ? 75 minutes of vigorous-intensity exercise every week. ? A mix of moderate-intensity and vigorous-intensity exercise every week.  Children, pregnant women, people who are out of shape, people who are overweight, and older adults may need to consult a health care provider for individual recommendations. If you have any sort of medical condition, be sure to consult your health care provider before starting a new exercise program. What are some activities that can help me to lose weight?  Walking at a rate of at least 4.5 miles an hour.  Jogging or running at a rate of 5 miles per hour.  Biking at a rate of at least 10 miles per hour.  Lap swimming.  Roller-skating or in-line skating.  Cross-country skiing.  Vigorous competitive sports, such as football, basketball, and soccer.  Jumping rope.  Aerobic dancing. How can I be more active in my day-to-day activities?  Use the stairs instead of the elevator.  Take a walk during your lunch break.  If you drive,  park your car farther away from work or school.  If you take public transportation, get off one stop early and walk the rest of the way.  Make all of your phone calls while standing up and walking around.  Get up, stretch, and walk around every 30 minutes throughout the day. What guidelines should I follow while exercising?  Do not exercise so much that you hurt yourself, feel dizzy, or get very short of breath.  Consult your health care provider prior to starting a new exercise program.  Wear comfortable clothes and shoes with good support.  Drink plenty of water while you exercise to prevent dehydration or heat stroke. Body water is lost during exercise and must be replaced.  Work out until you breathe faster and your heart beats faster. This information is not intended to replace advice given to you by your health care provider. Make sure you discuss any questions you have with your health care provider. Document Released: 09/02/2010 Document Revised: 01/06/2016 Document Reviewed: 01/01/2014 Elsevier Interactive Patient Education  2018 [place].  Increase regular movement to at least 164mins/week. Recommendations:  YouTube, Control and instrumentation engineer. Park further away from buildings, use stairs not elevators. Increase daily walking with dog. Follow heart healthy diet and try to drink at least gallon/water a day. Start with phentermine 15mg  each morning. BP above  goal today, increased Benzapril/HCTZ to 20/25mg  Updated in system and instructed to take two 10/12.5mg  tablets to equal the dosage increase until bottle completed. Continue to check BP at home and if continues to run >140/90 for more than 2 weeks then instructed to call clinic. Follow-up in 4 weeks.

## 2017-03-13 NOTE — Progress Notes (Addendum)
Subjective:    Patient ID: Cindy Figueroa, female    DOB: 08-04-1951, 66 y.o.   MRN: [id number]  HPI:  Cindy Figueroa is here for medical wt loss. She had two injections over a month period to help treat L sided sciatica pain and has been able increase daily walking.  She "feel off the grid" eating the last month with increased consumption of saturated fat/Na++, however continues to drink at least 60 ounces water/daily.  She has been taking daily Lotesin 10/12.5mg  daily and reports SBP 130-150s, DBP 70-90s.  She denies CP/palpitations/dyspnea.  She denies tobacco use.  We again discussed her most recent lipid panel results and reinforced the need to normalize numbers.  Both of her daughters have lost > 50lbs with help of Rx Phentermine and she would like to try the medication.  She denies use of tobacco/EOTH and denies hx of substance abuse.  Patient Care Team    Relationship Specialty Notifications Start End  Mina Marble D, NP PCP - General Family Medicine  12/26/16   Allyn Kenner, MD  Dermatology  12/26/16   Starling Manns, MD  Orthopedic Surgery  12/26/16     Patient Active Problem List   Diagnosis Date Noted  . Obesity with body mass index of 30.0-39.9 03/13/2017  . Hyperlipidemia 03/13/2017  . Vitamin D deficiency 01/11/2017  . HTN (hypertension) 12/26/2016  . Poor balance 12/26/2016  . H/O fatigue 12/26/2016  . Family history of high cholesterol 12/26/2016     Past Medical History:  Diagnosis Date  . Hypertension      History reviewed. No pertinent surgical history.   Family History  Problem Relation Age of Onset  . Hyperlipidemia Mother   . Hypertension Mother   . Cancer Father   . Emphysema Father      History  Drug Use No     History  Alcohol Use No     History  Smoking Status  . Never Smoker  Smokeless Tobacco  . Never Used     Outpatient Encounter Prescriptions as of 03/13/2017  Medication Sig  . benazepril-hydrochlorthiazide (LOTENSIN HCT) 20-25  MG tablet Take 1 tablet by mouth daily.  Marland Kitchen gabapentin (NEURONTIN) 300 MG capsule Take 300 mg by mouth at bedtime as needed.  Marland Kitchen OVER THE COUNTER MEDICATION   . [DISCONTINUED] benazepril-hydrochlorthiazide (LOTENSIN HCT) 10-12.5 MG tablet Take 1 tablet by mouth daily.  . [DISCONTINUED] gabapentin (NEURONTIN) 300 MG capsule TAKE 1 CAPSULE BY ORAL ROUTE 2 TIMES EVERY DAY  . phentermine 15 MG capsule Take 1 capsule (15 mg total) by mouth every morning.   No facility-administered encounter medications on file as of 03/13/2017.     Allergies: Penicillins and Plavix [clopidogrel bisulfate]  Body mass index is 35.33 kg/m.  Blood pressure (!) 153/85, pulse 76, weight 212 lb 4.8 oz (96.3 kg).   Review of Systems  Constitutional: Positive for fatigue. Negative for activity change, appetite change, chills, diaphoresis, fever and unexpected weight change.  Eyes: Negative for visual disturbance.  Respiratory: Negative for cough, chest tightness, shortness of breath, wheezing and stridor.   Cardiovascular: Negative for chest pain, palpitations and leg swelling.  Gastrointestinal: Negative for abdominal distention, abdominal pain, blood in stool, constipation, diarrhea, nausea and vomiting.  Endocrine: Negative for cold intolerance, heat intolerance, polydipsia, polyphagia and polyuria.  Genitourinary: Negative for difficulty urinating, flank pain and hematuria.  Musculoskeletal: Positive for arthralgias, back pain and myalgias. Negative for gait problem and joint swelling.  Skin: Negative for color change,  pallor, rash and wound.  Neurological: Negative for dizziness, tremors, weakness and headaches.  Hematological: Does not bruise/bleed easily.  Psychiatric/Behavioral: Negative for dysphoric mood, self-injury, sleep disturbance and suicidal ideas. The patient is not nervous/anxious.        Objective:   Physical Exam  Constitutional: She is oriented to person, place, and time. She appears  well-developed and well-nourished. No distress.  HENT:  Head: Normocephalic and atraumatic.  Right Ear: External ear normal.  Left Ear: External ear normal.  Eyes: Pupils are equal, round, and reactive to light. Conjunctivae are normal.  Neck: Normal range of motion. Neck supple.  Cardiovascular: Normal rate, regular rhythm, normal heart sounds and intact distal pulses.   No murmur heard. Pulmonary/Chest: Effort normal and breath sounds normal.  Musculoskeletal: Normal range of motion. She exhibits no deformity.  Lymphadenopathy:    She has no cervical adenopathy.  Neurological: She is alert and oriented to person, place, and time.  Skin: Skin is warm and dry. No rash noted. She is not diaphoretic. No erythema. No pallor.  Psychiatric: She has a normal mood and affect. Her behavior is normal. Judgment and thought content normal.  Nursing note and vitals reviewed.         Assessment & Plan:   1. Essential hypertension   2. Obesity with body mass index of 30.0-39.9   3. Hyperlipidemia, unspecified hyperlipidemia type     HTN (hypertension) BP above goal today, increased Benzapril/HCTZ to 20/25mg  Updated in system and instructed to take two 10/12.5mg  tablets to equal the dosage increase until bottle completed. Continue to check BP at home and if continues to run >140/90 for more than 2 weeks then instructed to call clinic. Increase water, exercise and reduce Na++ F/u in 4 weeks.  Obesity with body mass index of 30.0-39.9 Increase regular movement to at least 167mins/week. Recommendations:  YouTube, Control and instrumentation engineer. Park further away from buildings, use stairs not elevators. Increase daily walking with dog. Follow heart healthy diet and try to drink at least gallon/water a day. Start with phentermine 15mg  each morning and follow-up in 4 weeks. [place] Controlled Substance Website verified-no contraindications identified.   Hyperlipidemia Will re-check lipid panel Nov  2018    FOLLOW-UP:  Return in about 4 weeks (around 04/10/2017) for Regular Follow Up, HTN, Medical Weight Loss.

## 2017-03-13 NOTE — Assessment & Plan Note (Signed)
BP above goal today, increased Benzapril/HCTZ to 20/25mg  Updated in system and instructed to take two 10/12.5mg  tablets to equal the dosage increase until bottle completed. Continue to check BP at home and if continues to run >140/90 for more than 2 weeks then instructed to call clinic. Increase water, exercise and reduce Na++ F/u in 4 weeks.

## 2017-03-13 NOTE — Assessment & Plan Note (Addendum)
Increase regular movement to at least 127mins/week. Recommendations:  YouTube, Control and instrumentation engineer. Park further away from buildings, use stairs not elevators. Increase daily walking with dog. Follow heart healthy diet and try to drink at least gallon/water a day. Start with phentermine 15mg  each morning and follow-up in 4 weeks. [place] Controlled Substance Website verified-no contraindications identified.

## 2017-03-13 NOTE — Assessment & Plan Note (Signed)
Will re-check lipid panel Nov 2018

## 2017-03-19 DIAGNOSIS — M5416 Radiculopathy, lumbar region: Secondary | ICD-10-CM | POA: Diagnosis not present

## 2017-03-19 DIAGNOSIS — M4726 Other spondylosis with radiculopathy, lumbar region: Secondary | ICD-10-CM | POA: Diagnosis not present

## 2017-04-04 ENCOUNTER — Telehealth: Payer: Self-pay | Admitting: Adult Health

## 2017-04-04 MED ORDER — BENAZEPRIL-HYDROCHLOROTHIAZIDE 20-25 MG PO TABS: 1.0000 | ORAL_TABLET | Freq: Every day | ORAL | 1 refills | Status: DC

## 2017-04-04 NOTE — Telephone Encounter (Signed)
Patient is requesting a refill of her blood pressure meds sent to [facility]

## 2017-04-04 NOTE — Telephone Encounter (Signed)
Patient is requesting a refill of her blood pressure meds sent to [facility]

## 2017-04-04 NOTE — Addendum Note (Signed)
Addended by: Fonnie Mu on: 04/04/2017 11:09 AM   Modules accepted: Orders

## 2017-04-05 ENCOUNTER — Ambulatory Visit: Payer: Medicare Other | Admitting: Adult Health

## 2017-04-10 ENCOUNTER — Ambulatory Visit: Payer: Medicare Other | Admitting: Adult Health

## 2017-04-23 ENCOUNTER — Telehealth: Payer: Self-pay | Admitting: Adult Health

## 2017-04-23 NOTE — Telephone Encounter (Signed)
Pt request status on 2 bill Medicare denied and left her responsible for balance. --glh

## 2017-04-26 ENCOUNTER — Telehealth: Payer: Self-pay | Admitting: Adult Health

## 2017-04-26 NOTE — Telephone Encounter (Signed)
Patient wants to speak to someone clinical about the phentermine

## 2017-04-26 NOTE — Telephone Encounter (Signed)
Pt requested phentermine RX.  However, pt did not follow up end of August as instructed.  Therefore, advised pt that she must have OV prior to any RX for phentermine.  Pt expressed understanding and is agreeable.  Charyl Bigger, CMA

## 2017-05-23 ENCOUNTER — Telehealth: Payer: Self-pay | Admitting: Adult Health

## 2017-05-23 NOTE — Telephone Encounter (Signed)
Pt called states provider changed dosage to 2 tablets daily & she has run out-- advised I would msg to provider--glh

## 2017-05-28 ENCOUNTER — Telehealth: Payer: Self-pay | Admitting: Adult Health

## 2017-05-28 MED ORDER — BENAZEPRIL-HYDROCHLOROTHIAZIDE 20-25 MG PO TABS: 1.0000 | ORAL_TABLET | Freq: Every day | ORAL | 0 refills | Status: DC

## 2017-05-28 NOTE — Telephone Encounter (Signed)
Pt was instructed with last refill that she needed f/u prior to any further refills.  Please see Tyler's note re: why patient has not come for OV.  Please review and refill if appropriate.  Charyl Bigger, CMA

## 2017-05-28 NOTE — Addendum Note (Signed)
Addended by: Fonnie Mu on: 05/28/2017 04:32 PM   Modules accepted: Orders

## 2017-05-28 NOTE — Telephone Encounter (Signed)
Staff message sent to Annabell Sabal to call pt for appt.  No further refills until OV.  Charyl Bigger, CMA

## 2017-05-28 NOTE — Telephone Encounter (Signed)
Afternoon Tonya, If it is her BP med that she wants refilled, please send in 15 day supply and have her make an OV. Thanks! Valetta Fuller

## 2017-05-28 NOTE — Telephone Encounter (Signed)
Patient is still needing a refill that was sent last week via phone message, patient understood that we were out of the office but was following up that is this does get filled today or the next couple of days to send it to [facility] is flooded). Patient was to come in for a f/u but because of billing issues Dorothea Ogle can explain further) she was waiting for this to resolve before sch a med f/u appt.

## 2017-05-28 NOTE — Telephone Encounter (Signed)
See additional phone note for 05/28/17.  Charyl Bigger, CMA

## 2017-06-06 ENCOUNTER — Encounter: Payer: Self-pay | Admitting: Adult Health

## 2017-06-06 ENCOUNTER — Ambulatory Visit (INDEPENDENT_AMBULATORY_CARE_PROVIDER_SITE_OTHER): Payer: Medicare Other | Admitting: Adult Health

## 2017-06-06 DIAGNOSIS — I10 Essential (primary) hypertension: Secondary | ICD-10-CM | POA: Diagnosis not present

## 2017-06-06 DIAGNOSIS — E785 Hyperlipidemia, unspecified: Secondary | ICD-10-CM

## 2017-06-06 DIAGNOSIS — E669 Obesity, unspecified: Secondary | ICD-10-CM

## 2017-06-06 MED ORDER — BENAZEPRIL-HYDROCHLOROTHIAZIDE 20-25 MG PO TABS: 1.0000 | ORAL_TABLET | Freq: Every day | ORAL | 2 refills | Status: DC

## 2017-06-06 NOTE — Assessment & Plan Note (Signed)
Has lost 8 lbs since last OV 02/2017, via diet/exercise- will continue with approach instead of refill on phentermine.

## 2017-06-06 NOTE — Patient Instructions (Addendum)
Heart-Healthy Eating Plan Many factors influence your heart health, including eating and exercise habits. Heart (coronary) risk increases with abnormal blood fat (lipid) levels. Heart-healthy meal planning includes limiting unhealthy fats, increasing healthy fats, and making other small dietary changes. This includes maintaining a healthy body weight to help keep lipid levels within a normal range. What is my plan? Your health care provider recommends that you:  Get no more than __25__% of the total calories in your daily diet from fat.  Limit your intake of saturated fat to less than ___5__% of your total calories each day.  Limit the amount of cholesterol in your diet to less than __300__ mg per day.  What types of fat should I choose?  Choose healthy fats more often. Choose monounsaturated and polyunsaturated fats, such as olive oil and canola oil, flaxseeds, walnuts, almonds, and seeds.  Eat more omega-3 fats. Good choices include salmon, mackerel, sardines, tuna, flaxseed oil, and ground flaxseeds. Aim to eat fish at least two times each week.  Limit saturated fats. Saturated fats are primarily found in animal products, such as meats, butter, and cream. Plant sources of saturated fats include palm oil, palm kernel oil, and coconut oil.  Avoid foods with partially hydrogenated oils in them. These contain trans fats. Examples of foods that contain trans fats are stick margarine, some tub margarines, cookies, crackers, and other baked goods. What general guidelines do I need to follow?  Check food labels carefully to identify foods with trans fats or high amounts of saturated fat.  Fill one half of your plate with vegetables and green salads. Eat 4-5 servings of vegetables per day. A serving of vegetables equals 1 cup of raw leafy vegetables,  cup of raw or cooked cut-up vegetables, or  cup of vegetable juice.  Fill one fourth of your plate with whole grains. Look for the word "whole"  as the first word in the ingredient list.  Fill one fourth of your plate with lean protein foods.  Eat 4-5 servings of fruit per day. A serving of fruit equals one medium whole fruit,  cup of dried fruit,  cup of fresh, frozen, or canned fruit, or  cup of 100% fruit juice.  Eat more foods that contain soluble fiber. Examples of foods that contain this type of fiber are apples, broccoli, carrots, beans, peas, and barley. Aim to get 20-30 g of fiber per day.  Eat more home-cooked food and less restaurant, buffet, and fast food.  Limit or avoid alcohol.  Limit foods that are high in starch and sugar.  Avoid fried foods.  Cook foods by using methods other than frying. Baking, boiling, grilling, and broiling are all great options. Other fat-reducing suggestions include: ? Removing the skin from poultry. ? Removing all visible fats from meats. ? Skimming the fat off of stews, soups, and gravies before serving them. ? Steaming vegetables in water or broth.  Lose weight if you are overweight. Losing just 5-10% of your initial body weight can help your overall health and prevent diseases such as diabetes and heart disease.  Increase your consumption of nuts, legumes, and seeds to 4-5 servings per week. One serving of dried beans or legumes equals  cup after being cooked, one serving of nuts equals 1 ounces, and one serving of seeds equals  ounce or 1 tablespoon.  You may need to monitor your salt (sodium) intake, especially if you have high blood pressure. Talk with your health care provider or dietitian to get  more information about reducing sodium. What foods can I eat? Grains  Breads, including [place], white, pita, wheat, raisin, rye, oatmeal, and [place]. Tortillas that are neither fried nor made with lard or trans fat. Low-fat rolls, including hotdog and hamburger buns and English muffins. Biscuits. Muffins. Waffles. Pancakes. Light popcorn. Whole-grain cereals. Flatbread. Melba  toast. Pretzels. Breadsticks. Rusks. Low-fat snacks and crackers, including oyster, saltine, matzo, graham, animal, and rye. Rice and pasta, including brown rice and those that are made with whole wheat. Vegetables All vegetables. Fruits All fruits, but limit coconut. Meats and Other Protein Sources Lean, well-trimmed beef, veal, pork, and lamb. Chicken and [place] without skin. All fish and shellfish. Wild duck, rabbit, pheasant, and venison. Egg whites or low-cholesterol egg substitutes. Dried beans, peas, lentils, and tofu.Seeds and most nuts. Dairy Low-fat or nonfat cheeses, including ricotta, string, and mozzarella. Skim or 1% milk that is liquid, powdered, or evaporated. Buttermilk that is made with low-fat milk. Nonfat or low-fat yogurt. Beverages Mineral water. Diet carbonated beverages. Sweets and Desserts Sherbets and fruit ices. Honey, jam, marmalade, jelly, and syrups. Meringues and gelatins. Pure sugar candy, such as hard candy, jelly beans, gumdrops, mints, marshmallows, and small amounts of dark chocolate. [employer] Eat all sweets and desserts in moderation. Fats and Oils Nonhydrogenated (trans-free) margarines. Vegetable oils, including soybean, sesame, sunflower, olive, peanut, safflower, corn, canola, and cottonseed. Salad dressings or mayonnaise that are made with a vegetable oil. Limit added fats and oils that you use for cooking, baking, salads, and as spreads. Other Cocoa powder. Coffee and tea. All seasonings and condiments. The items listed above may not be a complete list of recommended foods or beverages. Contact your dietitian for more options. What foods are not recommended? Grains Breads that are made with saturated or trans fats, oils, or whole milk. Croissants. Butter rolls. Cheese breads. Sweet rolls. Donuts. Buttered popcorn. Chow mein noodles. High-fat crackers, such as cheese or butter crackers. Meats and Other Protein Sources Fatty meats, such as  hotdogs, short ribs, sausage, spareribs, bacon, ribeye roast or steak, and mutton. High-fat deli meats, such as salami and bologna. Caviar. Domestic duck and goose. Organ meats, such as kidney, liver, sweetbreads, brains, gizzard, chitterlings, and heart. Dairy Cream, sour cream, cream cheese, and creamed cottage cheese. Whole milk cheeses, including blue (bleu), Monte[place] Jack, [place], [place], American, [place], Swiss, [place], [place], and [place]. Whole or 2% milk that is liquid, evaporated, or condensed. Whole buttermilk. Cream sauce or high-fat cheese sauce. Yogurt that is made from whole milk. Beverages Regular sodas and drinks with added sugar. Sweets and Desserts Frosting. Pudding. Cookies. Cakes other than angel food cake. Candy that has milk chocolate or white chocolate, hydrogenated fat, butter, coconut, or unknown ingredients. Buttered syrups. Full-fat ice cream or ice cream drinks. Fats and Oils Gravy that has suet, meat fat, or shortening. Cocoa butter, hydrogenated oils, palm oil, coconut oil, palm kernel oil. These can often be found in baked products, candy, fried foods, nondairy creamers, and whipped toppings. Solid fats and shortenings, including bacon fat, salt pork, lard, and butter. Nondairy cream substitutes, such as coffee creamers and sour cream substitutes. Salad dressings that are made of unknown oils, cheese, or sour cream. The items listed above may not be a complete list of foods and beverages to avoid. Contact your dietitian for more information. This information is not intended to replace advice given to you by your health care provider. Make sure you discuss any questions you have with your health care  provider. Document Released: 05/09/2008 Document Revised: 02/18/2016 Document Reviewed: 01/22/2014 Elsevier Interactive Patient Education  2017 [place].   Hypertension Hypertension, commonly called high blood pressure, is when the force of blood pumping through the  arteries is too strong. The arteries are the blood vessels that carry blood from the heart throughout the body. Hypertension forces the heart to work harder to pump blood and may cause arteries to become narrow or stiff. Having untreated or uncontrolled hypertension can cause heart attacks, strokes, kidney disease, and other problems. A blood pressure reading consists of a higher number over a lower number. Ideally, your blood pressure should be below 120/80. The first ("top") number is called the systolic pressure. It is a measure of the pressure in your arteries as your heart beats. The second ("bottom") number is called the diastolic pressure. It is a measure of the pressure in your arteries as the heart relaxes. What are the causes? The cause of this condition is not known. What increases the risk? Some risk factors for high blood pressure are under your control. Others are not. Factors you can change  Smoking.  Having type 2 diabetes mellitus, high cholesterol, or both.  Not getting enough exercise or physical activity.  Being overweight.  Having too much fat, sugar, calories, or salt (sodium) in your diet.  Drinking too much alcohol. Factors that are difficult or impossible to change  Having chronic kidney disease.  Having a family history of high blood pressure.  Age. Risk increases with age.  Race. You may be at higher risk if you are African-American.  Gender. Men are at higher risk than women before age 66. After age 80, women are at higher risk than men.  Having obstructive sleep apnea.  Stress. What are the signs or symptoms? Extremely high blood pressure (hypertensive crisis) may cause:  Headache.  Anxiety.  Shortness of breath.  Nosebleed.  Nausea and vomiting.  Severe chest pain.  Jerky movements you cannot control (seizures).  How is this diagnosed? This condition is diagnosed by measuring your blood pressure while you are seated, with your arm  resting on a surface. The cuff of the blood pressure monitor will be placed directly against the skin of your upper arm at the level of your heart. It should be measured at least twice using the same arm. Certain conditions can cause a difference in blood pressure between your right and left arms. Certain factors can cause blood pressure readings to be lower or higher than normal (elevated) for a short period of time:  When your blood pressure is higher when you are in a health care provider's office than when you are at home, this is called white coat hypertension. Most people with this condition do not need medicines.  When your blood pressure is higher at home than when you are in a health care provider's office, this is called masked hypertension. Most people with this condition may need medicines to control blood pressure.  If you have a high blood pressure reading during one visit or you have normal blood pressure with other risk factors:  You may be asked to return on a different day to have your blood pressure checked again.  You may be asked to monitor your blood pressure at home for 1 week or longer.  If you are diagnosed with hypertension, you may have other blood or imaging tests to help your health care provider understand your overall risk for other conditions. How is this treated? This  condition is treated by making healthy lifestyle changes, such as eating healthy foods, exercising more, and reducing your alcohol intake. Your health care provider may prescribe medicine if lifestyle changes are not enough to get your blood pressure under control, and if:  Your systolic blood pressure is above 130.  Your diastolic blood pressure is above 80.  Your personal target blood pressure may vary depending on your medical conditions, your age, and other factors. Follow these instructions at home: Eating and drinking  Eat a diet that is high in fiber and potassium, and low in sodium,  added sugar, and fat. An example eating plan is called the DASH (Dietary Approaches to Stop Hypertension) diet. To eat this way: ? Eat plenty of fresh fruits and vegetables. Try to fill half of your plate at each meal with fruits and vegetables. ? Eat whole grains, such as whole wheat pasta, brown rice, or whole grain bread. Fill about one quarter of your plate with whole grains. ? Eat or drink low-fat dairy products, such as skim milk or low-fat yogurt. ? Avoid fatty cuts of meat, processed or cured meats, and poultry with skin. Fill about one quarter of your plate with lean proteins, such as fish, chicken without skin, beans, eggs, and tofu. ? Avoid premade and processed foods. These tend to be higher in sodium, added sugar, and fat.  Reduce your daily sodium intake. Most people with hypertension should eat less than 1,500 mg of sodium a day.  Limit alcohol intake to no more than 1 drink a day for nonpregnant women and 2 drinks a day for men. One drink equals 12 oz of beer, 5 oz of wine, or 1 oz of hard liquor. Lifestyle  Work with your health care provider to maintain a healthy body weight or to lose weight. Ask what an ideal weight is for you.  Get at least 30 minutes of exercise that causes your heart to beat faster (aerobic exercise) most days of the week. Activities may include walking, swimming, or biking.  Include exercise to strengthen your muscles (resistance exercise), such as pilates or lifting weights, as part of your weekly exercise routine. Try to do these types of exercises for 30 minutes at least 3 days a week.  Do not use any products that contain nicotine or tobacco, such as cigarettes and e-cigarettes. If you need help quitting, ask your health care provider.  Monitor your blood pressure at home as told by your health care provider.  Keep all follow-up visits as told by your health care provider. This is important. Medicines  Take over-the-counter and prescription  medicines only as told by your health care provider. Follow directions carefully. Blood pressure medicines must be taken as prescribed.  Do not skip doses of blood pressure medicine. Doing this puts you at risk for problems and can make the medicine less effective.  Ask your health care provider about side effects or reactions to medicines that you should watch for. Contact a health care provider if:  You think you are having a reaction to a medicine you are taking.  You have headaches that keep coming back (recurring).  You feel dizzy.  You have swelling in your ankles.  You have trouble with your vision. Get help right away if:  You develop a severe headache or confusion.  You have unusual weakness or numbness.  You feel faint.  You have severe pain in your chest or abdomen.  You vomit repeatedly.  You have trouble breathing.  Summary  Hypertension is when the force of blood pumping through your arteries is too strong. If this condition is not controlled, it may put you at risk for serious complications.  Your personal target blood pressure may vary depending on your medical conditions, your age, and other factors. For most people, a normal blood pressure is less than 120/80.  Hypertension is treated with lifestyle changes, medicines, or a combination of both. Lifestyle changes include weight loss, eating a healthy, low-sodium diet, exercising more, and limiting alcohol. This information is not intended to replace advice given to you by your health care provider. Make sure you discuss any questions you have with your health care provider. Document Released: 07/31/2005 Document Revised: 06/28/2016 Document Reviewed: 06/28/2016 Elsevier Interactive Patient Education  2018 [place] on Lotensin HCT 20/25mg  sent in, please take as directed. Continue drinking plenty of water and follow heart healthy diet. Please schedule complete physical with fasting labs (we need  to re-check your cholesterol levels) in 6 months. GREAT TO SEE YOU!

## 2017-06-06 NOTE — Assessment & Plan Note (Signed)
Will re-check lipids at CPE in 6 months

## 2017-06-06 NOTE — Assessment & Plan Note (Addendum)
BP at goal 140/78, HR 81 Continue Benazepril/HCTZ 20/25mg  daily Refill sent in Follow heart healthy diet

## 2017-06-06 NOTE — Progress Notes (Signed)
Subjective:    Patient ID: Cindy Figueroa, female    DOB: 21-Nov-1950, 66 y.o.   MRN: [id number]  HPI:  OV Notes 03/13/2017: Cindy Figueroa is here for medical wt loss. She had two injections over a month period to help treat L sided sciatica pain and has been able increase daily walking.  She "feel off the grid" eating the last month with increased consumption of saturated fat/Na++, however continues to drink at least 60 ounces water/daily.  She has been taking daily Lotesin 10/12.5mg  daily and reports SBP 130-150s, DBP 70-90s.  She denies CP/palpitations/dyspnea.  She denies tobacco use.  We again discussed her most recent lipid panel results and reinforced the need to normalize numbers.  Both of her daughters have lost > 50lbs with help of Rx Phentermine and she would like to try the medication.  She denies use of tobacco/EOTH and denies hx of substance abuse.  Today's OV Notes 06/06/2017: Cindy Figueroa is here for f/u: HTN She has been compliant on Benzepril/HCTZ and denies acute cardiac sx's.  She tried phentermine for 1/2 month and "didn't like it and it didn't work".  She has increased regular movement and been reducing portion size and has lost 8 lbs since last OV in July 2018.   Patient Care Team    Relationship Specialty Notifications Start End  Mina Marble D, NP PCP - General Family Medicine  12/26/16   Allyn Kenner, MD  Dermatology  12/26/16   Starling Manns, MD  Orthopedic Surgery  12/26/16     Patient Active Problem List   Diagnosis Date Noted  . Obesity with body mass index of 30.0-39.9 03/13/2017  . Hyperlipidemia 03/13/2017  . Vitamin D deficiency 01/11/2017  . HTN (hypertension) 12/26/2016  . Poor balance 12/26/2016  . H/O fatigue 12/26/2016  . Family history of high cholesterol 12/26/2016     Past Medical History:  Diagnosis Date  . Hypertension      History reviewed. No pertinent surgical history.   Family History  Problem Relation Age of Onset  . Hyperlipidemia  Mother   . Hypertension Mother   . Cancer Father   . Emphysema Father      History  Drug Use No     History  Alcohol Use No     History  Smoking Status  . Never Smoker  Smokeless Tobacco  . Never Used     Outpatient Encounter Prescriptions as of 06/06/2017  Medication Sig  . benazepril-hydrochlorthiazide (LOTENSIN HCT) 20-25 MG tablet Take 1 tablet by mouth daily.  Marland Kitchen gabapentin (NEURONTIN) 300 MG capsule Take 300 mg by mouth at bedtime as needed.  Marland Kitchen OVER THE COUNTER MEDICATION   . [DISCONTINUED] benazepril-hydrochlorthiazide (LOTENSIN HCT) 20-25 MG tablet Take 1 tablet by mouth daily. PATIENT MUST HAVE OFFICE VISIT PRIOR TO ANY FURTHER REFILLS  . [DISCONTINUED] benazepril-hydrochlorthiazide (LOTENSIN HCT) 20-25 MG tablet Take 1 tablet by mouth daily. PATIENT MUST HAVE OFFICE VISIT PRIOR TO ANY FURTHER REFILLS  . [DISCONTINUED] phentermine 15 MG capsule Take 1 capsule (15 mg total) by mouth every morning.   No facility-administered encounter medications on file as of 06/06/2017.     Allergies: Penicillins and Plavix [clopidogrel bisulfate]  Body mass index is 34.1 kg/m.  Blood pressure 140/78, pulse 81, height 5\' 5"  (1.651 m), weight 204 lb 14.4 oz (92.9 kg).   Review of Systems  Constitutional: Positive for fatigue. Negative for activity change, appetite change, chills, diaphoresis, fever and unexpected weight change.  Eyes: Negative for  visual disturbance.  Respiratory: Negative for cough, chest tightness, shortness of breath, wheezing and stridor.   Cardiovascular: Negative for chest pain, palpitations and leg swelling.  Gastrointestinal: Negative for abdominal distention, abdominal pain, blood in stool, constipation, diarrhea, nausea and vomiting.  Endocrine: Negative for cold intolerance, heat intolerance, polydipsia, polyphagia and polyuria.  Genitourinary: Negative for difficulty urinating, flank pain and hematuria.  Musculoskeletal: Positive for  arthralgias, back pain and myalgias. Negative for gait problem and joint swelling.  Skin: Negative for color change, pallor, rash and wound.  Neurological: Negative for dizziness, tremors, weakness and headaches.  Hematological: Does not bruise/bleed easily.  Psychiatric/Behavioral: Negative for dysphoric mood, self-injury, sleep disturbance and suicidal ideas. The patient is not nervous/anxious.        Objective:   Physical Exam  Constitutional: She is oriented to person, place, and time. She appears well-developed and well-nourished. No distress.  HENT:  Head: Normocephalic and atraumatic.  Right Ear: External ear normal.  Left Ear: External ear normal.  Eyes: Pupils are equal, round, and reactive to light. Conjunctivae are normal.  Neck: Normal range of motion. Neck supple.  Cardiovascular: Normal rate, regular rhythm, normal heart sounds and intact distal pulses.   No murmur heard. Pulmonary/Chest: Effort normal and breath sounds normal.  Musculoskeletal: Normal range of motion. She exhibits no deformity.  Lymphadenopathy:    She has no cervical adenopathy.  Neurological: She is alert and oriented to person, place, and time.  Skin: Skin is warm and dry. No rash noted. She is not diaphoretic. No erythema. No pallor.  Psychiatric: She has a normal mood and affect. Her behavior is normal. Judgment and thought content normal.  Nursing note and vitals reviewed.         Assessment & Plan:   1. Essential hypertension   2. Hyperlipidemia, unspecified hyperlipidemia type   3. Obesity with body mass index of 30.0-39.9     HTN (hypertension) BP at goal 140/78, HR 81 Continue Benazepril/HCTZ 20/25mg  daily Refill sent in Follow heart healthy diet  Hyperlipidemia Will re-check lipids at CPE in 6 months  Obesity with body mass index of 30.0-39.9 Has lost 8 lbs since last OV 02/2017, via diet/exercise- will continue with approach instead of refill on  phentermine.     FOLLOW-UP:  Return in about 6 months (around 12/05/2017) for CPE, Fasting Lab Draw.

## 2017-08-21 DIAGNOSIS — L570 Actinic keratosis: Secondary | ICD-10-CM | POA: Diagnosis not present

## 2017-08-21 DIAGNOSIS — L820 Inflamed seborrheic keratosis: Secondary | ICD-10-CM | POA: Diagnosis not present

## 2017-08-21 DIAGNOSIS — X32XXXD Exposure to sunlight, subsequent encounter: Secondary | ICD-10-CM | POA: Diagnosis not present

## 2017-09-20 ENCOUNTER — Other Ambulatory Visit: Payer: Self-pay | Admitting: Adult Health

## 2017-09-20 ENCOUNTER — Telehealth: Payer: Self-pay

## 2017-09-20 MED ORDER — LOSARTAN POTASSIUM-HCTZ 50-12.5 MG PO TABS: 1.0000 | ORAL_TABLET | Freq: Every day | ORAL | 3 refills | Status: DC

## 2017-09-20 NOTE — Telephone Encounter (Signed)
Pt states that she read in her paperwork from the pharmacy that benazepril can cause tingling in the hands.  She states that she is experiencing this and would like to know what she should do?  Please advise.  Charyl Bigger, CMA

## 2017-09-20 NOTE — Telephone Encounter (Signed)
Good Morning Cindy Figueroa, Since she is experiencing paresthesia with the Benazepril (ACE) I d/c'd the med. I started her on Losartan/HCTZ 50/12.5- ARB with diuretic- I tab daily Please tell her to check her BP daily and give [place] a call in two weeks with the readings.   Thanks! Valetta Fuller

## 2017-09-20 NOTE — Telephone Encounter (Signed)
Pt expressed understanding and is agreeable.  T. Nelson, CMA 

## 2017-09-25 ENCOUNTER — Telehealth: Payer: Self-pay | Admitting: Adult Health

## 2017-09-25 NOTE — Telephone Encounter (Signed)
Patient is requesting a call from the nurse after 4:30 (that's the next time she will be available after lunch today) and has some unspecified questions she did not leave in her VM. She can be reached at [phone]

## 2017-09-25 NOTE — Telephone Encounter (Signed)
Pt states that she is still experiencing tingling in fingers.  Pt states that she is going to stop gabapentin to see if this resolves the problem.  However, she is considering stopping all of her meds, HTN med included, to see if this resolves the problem too.  Please advise.  Charyl Bigger, CMA

## 2017-09-26 NOTE — Telephone Encounter (Signed)
Attempted to contact pt, however unable to leave a message as phone "hung up" on me.  Will attempt to reach pt this afternoon as that seems to be the best time to reach her.  Charyl Bigger, CMA

## 2017-09-26 NOTE — Telephone Encounter (Signed)
Pt informed.  Pt expressed understanding and is agreeable.  Pt transferred to front desk to schedule appt.  T. Nelson, CMA 

## 2017-09-26 NOTE — Telephone Encounter (Signed)
Good Morning Tonya, Can you please call Ms. Cino ans ask her to make an OV to discuss her sx's and medication regime. I would NOT suggest stopping the antihypertensive, that would increase her risk for MI/CVA Tapering off Gabapentin is recommended if someone has been taking the medication for a prolonged time and we can discuss weaning as well, stopping abruptly may cause SE. Thanks! Valetta Fuller

## 2017-09-26 NOTE — Progress Notes (Signed)
Subjective:    Patient ID: Cindy Figueroa, female    DOB: Aug 29, 1950, 67 y.o.   MRN: [id number]  HPI:  Ms. Brines presents with paresthesia of and believed it to be r/t to the Benazepril (ACE) that she had been on since 5/18. She feels that the bil upper extremity paresthesia began 5/6 weeks ago and was steadily worsening with peak sx's at night. Benazepril d/c'd on 09/20/17 and she was started on Losartan/HCTZ 50/12.5- ARB with diuretic. She reports 85 % reduction in paresthesia and denies acute cardiac sx's. She stopped taking Gabapentin > 4 days ago and feels that is "wasn't really improving the pinched nerve pain".   Patient Care Team    Relationship Specialty Notifications Start End  Mina Marble D, NP PCP - General Family Medicine  12/26/16   Allyn Kenner, MD  Dermatology  12/26/16   Starling Manns, MD  Orthopedic Surgery  12/26/16     Patient Active Problem List   Diagnosis Date Noted  . Obesity with body mass index of 30.0-39.9 03/13/2017  . Hyperlipidemia 03/13/2017  . Vitamin D deficiency 01/11/2017  . HTN (hypertension) 12/26/2016  . Poor balance 12/26/2016  . H/O fatigue 12/26/2016  . Family history of high cholesterol 12/26/2016     Past Medical History:  Diagnosis Date  . Hypertension      History reviewed. No pertinent surgical history.   Family History  Problem Relation Age of Onset  . Hyperlipidemia Mother   . Hypertension Mother   . Cancer Father   . Emphysema Father      Social History   Substance and Sexual Activity  Drug Use No     Social History   Substance and Sexual Activity  Alcohol Use No     Social History   Tobacco Use  Smoking Status Never Smoker  Smokeless Tobacco Never Used     Outpatient Encounter Medications as of 09/27/2017  Medication Sig  . losartan-hydrochlorothiazide (HYZAAR) 50-12.5 MG tablet Take 1 tablet by mouth daily.  Marland Kitchen OVER THE COUNTER MEDICATION 2 capsules daily.   Marland Kitchen gabapentin (NEURONTIN) 300 MG  capsule Take 300 mg by mouth at bedtime as needed.   No facility-administered encounter medications on file as of 09/27/2017.     Allergies: Penicillins and Plavix [clopidogrel bisulfate]  Body mass index is 33.95 kg/m.  Blood pressure 137/84, pulse 67, height 5\' 5"  (1.651 m), weight 204 lb (92.5 kg), SpO2 100 %.  Review of Systems  Constitutional: Positive for fatigue. Negative for activity change, appetite change, chills, diaphoresis, fever and unexpected weight change.  HENT: Positive for congestion.   Eyes: Negative for visual disturbance.  Respiratory: Negative for cough, chest tightness, shortness of breath, wheezing and stridor.   Cardiovascular: Negative for chest pain, palpitations and leg swelling.  Gastrointestinal: Negative for abdominal distention, anal bleeding, blood in stool, constipation, diarrhea, nausea and vomiting.  Endocrine: Negative for cold intolerance, heat intolerance, polydipsia, polyphagia and polyuria.  Genitourinary: Negative for difficulty urinating, flank pain and hematuria.  Skin: Negative for color change, pallor, rash and wound.  Neurological: Negative for dizziness and light-headedness.  Psychiatric/Behavioral: Negative for dysphoric mood, hallucinations, self-injury, sleep disturbance and suicidal ideas. The patient is not nervous/anxious and is not hyperactive.        Objective:   Physical Exam  Constitutional: She is oriented to person, place, and time. She appears well-developed and well-nourished. No distress.  HENT:  Head: Normocephalic and atraumatic.  Right Ear: External ear normal.  Left Ear:  External ear normal.  Eyes: Conjunctivae are normal. Pupils are equal, round, and reactive to light.  Cardiovascular: Normal rate, normal heart sounds and intact distal pulses.  No murmur heard. Pulmonary/Chest: Effort normal and breath sounds normal. No respiratory distress. She has no wheezes. She has no rales. She exhibits no tenderness.   Neurological: She is alert and oriented to person, place, and time. She has normal reflexes. She displays no tremor. She displays no seizure activity. Coordination normal.  Reflex Scores:      Tricep reflexes are 2+ on the right side and 2+ on the left side.      Bicep reflexes are 2+ on the right side and 2+ on the left side.      Brachioradialis reflexes are 2+ on the right side and 2+ on the left side.      Patellar reflexes are 2+ on the right side and 2+ on the left side.      Achilles reflexes are 2+ on the right side and 2+ on the left side. Skin: Skin is warm and dry. No rash noted. She is not diaphoretic. No erythema. No pallor.  Psychiatric: She has a normal mood and affect. Her behavior is normal. Judgment and thought content normal.  Nursing note and vitals reviewed.         Assessment & Plan:   1. Essential hypertension   2. Obesity with body mass index of 30.0-39.9     HTN (hypertension) 85% reduction in paresthesia since d/c'ing Benazepril and starting losartan/HCTZ 1 week ago. BP at goal 137/84, HR 67 Continue losartan/HCTZ 50/12.5mg   Switch back to decaf coffee    Obesity with body mass index of 30.0-39.9 She is interested in re-starting Phentermine Advised to take BP/HR daily and call clinic in 1 week with recordings  If BP at goal, will consider re-starting. She moves all day- house work/yard work and daily calisthenics    FOLLOW-UP:  Return in about 2 months (around 11/25/2017) for CPE.

## 2017-09-27 ENCOUNTER — Ambulatory Visit (INDEPENDENT_AMBULATORY_CARE_PROVIDER_SITE_OTHER): Payer: Medicare HMO | Admitting: Adult Health

## 2017-09-27 ENCOUNTER — Encounter: Payer: Self-pay | Admitting: Adult Health

## 2017-09-27 VITALS — BP 137/84 | HR 67 | Ht 65.0 in | Wt 204.0 lb

## 2017-09-27 DIAGNOSIS — I10 Essential (primary) hypertension: Secondary | ICD-10-CM

## 2017-09-27 DIAGNOSIS — E669 Obesity, unspecified: Secondary | ICD-10-CM | POA: Diagnosis not present

## 2017-09-27 NOTE — Assessment & Plan Note (Addendum)
85% reduction in paresthesia since d/c'ing Benazepril and starting losartan/HCTZ 1 week ago. BP at goal 137/84, HR 67 Continue losartan/HCTZ 50/12.5mg   Switch back to decaf coffee

## 2017-09-27 NOTE — Patient Instructions (Addendum)

## 2017-09-27 NOTE — Assessment & Plan Note (Signed)
She is interested in re-starting Phentermine Advised to take BP/HR daily and call clinic in 1 week with recordings  If BP at goal, will consider re-starting. She moves all day- house work/yard work and daily calisthenics

## 2017-10-08 ENCOUNTER — Other Ambulatory Visit: Payer: Self-pay | Admitting: Adult Health

## 2017-10-08 ENCOUNTER — Telehealth: Payer: Self-pay

## 2017-10-08 MED ORDER — PHENTERMINE HCL 37.5 MG PO CAPS: 37.5000 mg | ORAL_CAPSULE | ORAL | 0 refills | Status: DC

## 2017-10-08 NOTE — Telephone Encounter (Signed)
Excellent  I have checked database- one month sent in  Please have her make OV in one month  Thanks! Valetta Fuller

## 2017-10-08 NOTE — Progress Notes (Signed)
[place] Controlled Substance Database reviewed- no contraindications noted. BP well controlled  Re-started on Phentermine 37.5g QAM- f/u one month

## 2017-10-08 NOTE — Telephone Encounter (Signed)
Patient dropped off BP log as requested in order to obtain RX for phentermine.    09/28/17  114/70 09/29/17  122/68 09/30/17  117/63 10/01/17  127/77 10/02/17  115/72 10/03/17  118/75 10/04/17  122/71  Please advise.  Charyl Bigger, CMA

## 2017-10-08 NOTE — Telephone Encounter (Signed)
Patient informed.  Pt transferred to front desk to schedule appt.  Charyl Bigger, CMA

## 2017-11-05 ENCOUNTER — Ambulatory Visit (INDEPENDENT_AMBULATORY_CARE_PROVIDER_SITE_OTHER): Payer: Medicare HMO | Admitting: Adult Health

## 2017-11-05 ENCOUNTER — Encounter: Payer: Self-pay | Admitting: Adult Health

## 2017-11-05 VITALS — BP 119/75 | HR 79 | Ht 61.62 in | Wt 194.0 lb

## 2017-11-05 DIAGNOSIS — E669 Obesity, unspecified: Secondary | ICD-10-CM

## 2017-11-05 MED ORDER — PHENTERMINE HCL 37.5 MG PO CAPS: 37.5000 mg | ORAL_CAPSULE | ORAL | 0 refills | Status: DC

## 2017-11-05 NOTE — Progress Notes (Signed)
Subjective:    Patient ID: Cindy Figueroa, female    DOB: 1951-06-07, 67 y.o.   MRN: [id number]  HPI:  09/27/17 OV: Cindy Figueroa presents with paresthesia of and believed it to be r/t to the Benazepril (ACE) that she had been on since 5/18. She feels that the bil upper extremity paresthesia began 5/6 weeks ago and was steadily worsening with peak sx's at night. Benazepril d/c'd on 09/20/17 and she was started on Losartan/HCTZ 50/12.5- ARB with diuretic. She reports 85 % reduction in paresthesia and denies acute cardiac sx's. She stopped taking Gabapentin > 4 days ago and feels that is "wasn't really improving the pinched nerve pain".  11/05/17 OV: Cindy Figueroa presents for f/u, BP and medical weight loss Home readings SBP 110-120s DBP 60-70s She called 10/08/17 and reported home readings, which were at goal and one month of phentermine 37.5mg  sent in at that time. She has neen using rx every other day, increasing daily movement and reducing saturated fat/sugar/CHO and has lost 10 lbs in last 4 weeks. She denies CP/dyspnea/palpitations/insmonia.  Patient Care Team    Relationship Specialty Notifications Start End  Mina Marble D, NP PCP - General Family Medicine  12/26/16   Allyn Kenner, MD  Dermatology  12/26/16   Starling Manns, MD  Orthopedic Surgery  12/26/16     Patient Active Problem List   Diagnosis Date Noted  . Obesity with body mass index of 30.0-39.9 03/13/2017  . Hyperlipidemia 03/13/2017  . Vitamin D deficiency 01/11/2017  . HTN (hypertension) 12/26/2016  . Poor balance 12/26/2016  . H/O fatigue 12/26/2016  . Family history of high cholesterol 12/26/2016     Past Medical History:  Diagnosis Date  . Hypertension      No past surgical history on file.   Family History  Problem Relation Age of Onset  . Hyperlipidemia Mother   . Hypertension Mother   . Cancer Father   . Emphysema Father      Social History   Substance and Sexual Activity  Drug Use No      Social History   Substance and Sexual Activity  Alcohol Use No     Social History   Tobacco Use  Smoking Status Never Smoker  Smokeless Tobacco Never Used     Outpatient Encounter Medications as of 11/05/2017  Medication Sig  . losartan-hydrochlorothiazide (HYZAAR) 50-12.5 MG tablet Take 1 tablet by mouth daily.  Marland Kitchen OVER THE COUNTER MEDICATION 2 capsules daily.   Derrill Memo ON 12/06/2017] phentermine 37.5 MG capsule Take 1 capsule (37.5 mg total) by mouth every morning.  . [DISCONTINUED] phentermine 37.5 MG capsule Take 1 capsule (37.5 mg total) by mouth every morning.  . [DISCONTINUED] phentermine 37.5 MG capsule Take 1 capsule (37.5 mg total) by mouth every morning.  . [DISCONTINUED] gabapentin (NEURONTIN) 300 MG capsule Take 300 mg by mouth at bedtime as needed.   No facility-administered encounter medications on file as of 11/05/2017.     Allergies: Penicillins and Plavix [clopidogrel bisulfate]  Body mass index is 35.92 kg/m.  Blood pressure 119/75, pulse 79, height 5' 1.62" (1.565 m), weight 194 lb (88 kg), SpO2 98 %.  Review of Systems  Constitutional: Positive for fatigue. Negative for activity change, appetite change, chills, diaphoresis, fever and unexpected weight change.  HENT: Positive for congestion.   Eyes: Negative for visual disturbance.  Respiratory: Negative for cough, chest tightness, shortness of breath, wheezing and stridor.   Cardiovascular: Negative for chest pain, palpitations and leg  swelling.  Gastrointestinal: Negative for abdominal distention, anal bleeding, blood in stool, constipation, diarrhea, nausea and vomiting.  Endocrine: Negative for cold intolerance, heat intolerance, polydipsia, polyphagia and polyuria.  Genitourinary: Negative for difficulty urinating, flank pain and hematuria.  Skin: Negative for color change, pallor, rash and wound.  Neurological: Negative for dizziness and light-headedness.  Psychiatric/Behavioral: Negative  for dysphoric mood, hallucinations, self-injury, sleep disturbance and suicidal ideas. The patient is not nervous/anxious and is not hyperactive.        Objective:   Physical Exam  Constitutional: She is oriented to person, place, and time. She appears well-developed and well-nourished. No distress.  HENT:  Head: Normocephalic and atraumatic.  Right Ear: External ear normal.  Left Ear: External ear normal.  Eyes: Pupils are equal, round, and reactive to light. Conjunctivae are normal.  Cardiovascular: Normal rate, normal heart sounds and intact distal pulses.  No murmur heard. Pulmonary/Chest: Effort normal and breath sounds normal. No respiratory distress. She has no wheezes. She has no rales. She exhibits no tenderness.  Neurological: She is alert and oriented to person, place, and time. She has normal reflexes.  Skin: Skin is warm and dry. No rash noted. She is not diaphoretic. No erythema. No pallor.  Psychiatric: She has a normal mood and affect. Her behavior is normal. Judgment and thought content normal.  Nursing note and vitals reviewed.         Assessment & Plan:   1. Obesity with body mass index of 30.0-39.9     Obesity with body mass index of 30.0-39.9 Body mass index is 35.92 kg/m.  She has lost 10 lbs in 4 weeks! [place] Controlled Substance Database reviewed- no aberrances noted. Month supply of Phentermine sent in, when you need refill please call office and we will send in another month. Follow Heart Healthy diet Increase regular exercise.  Recommend at least 30 minutes daily, 5 days per week of walking, jogging, biking, swimming, YouTube/Pinterest workout videos. Follow-up in 2 months.    FOLLOW-UP:  Return in about 2 months (around 01/05/2018) for Medical Weight Loss.

## 2017-11-05 NOTE — Patient Instructions (Signed)
Heart-Healthy Eating Plan Heart-healthy meal planning includes:  Limiting unhealthy fats.  Increasing healthy fats.  Making other small dietary changes.  You may need to talk with your doctor or a diet specialist (dietitian) to create an eating plan that is right for you. What types of fat should I choose?  Choose healthy fats. These include olive oil and canola oil, flaxseeds, walnuts, almonds, and seeds.  Eat more omega-3 fats. These include salmon, mackerel, sardines, tuna, flaxseed oil, and ground flaxseeds. Try to eat fish at least twice each week.  Limit saturated fats. ? Saturated fats are often found in animal products, such as meats, butter, and cream. ? Plant sources of saturated fats include palm oil, palm kernel oil, and coconut oil.  Avoid foods with partially hydrogenated oils in them. These include stick margarine, some tub margarines, cookies, crackers, and other baked goods. These contain trans fats. What general guidelines do I need to follow?  Check food labels carefully. Identify foods with trans fats or high amounts of saturated fat.  Fill one half of your plate with vegetables and green salads. Eat 4-5 servings of vegetables per day. A serving of vegetables is: ? 1 cup of raw leafy vegetables. ?  cup of raw or cooked cut-up vegetables. ?  cup of vegetable juice.  Fill one fourth of your plate with whole grains. Look for the word "whole" as the first word in the ingredient list.  Fill one fourth of your plate with lean protein foods.  Eat 4-5 servings of fruit per day. A serving of fruit is: ? One medium whole fruit. ?  cup of dried fruit. ?  cup of fresh, frozen, or canned fruit. ?  cup of 100% fruit juice.  Eat more foods that contain soluble fiber. These include apples, broccoli, carrots, beans, peas, and barley. Try to get 20-30 g of fiber per day.  Eat more home-cooked food. Eat less restaurant, buffet, and fast food.  Limit or avoid  alcohol.  Limit foods high in starch and sugar.  Avoid fried foods.  Avoid frying your food. Try baking, boiling, grilling, or broiling it instead. You can also reduce fat by: ? Removing the skin from poultry. ? Removing all visible fats from meats. ? Skimming the fat off of stews, soups, and gravies before serving them. ? Steaming vegetables in water or broth.  Lose weight if you are overweight.  Eat 4-5 servings of nuts, legumes, and seeds per week: ? One serving of dried beans or legumes equals  cup after being cooked. ? One serving of nuts equals 1 ounces. ? One serving of seeds equals  ounce or one tablespoon.  You may need to keep track of how much salt or sodium you eat. This is especially true if you have high blood pressure. Talk with your doctor or dietitian to get more information. What foods can I eat? Grains Breads, including French, white, pita, wheat, raisin, rye, oatmeal, and Italian. Tortillas that are neither fried nor made with lard or trans fat. Low-fat rolls, including hotdog and hamburger buns and English muffins. Biscuits. Muffins. Waffles. Pancakes. Light popcorn. Whole-grain cereals. Flatbread. Melba toast. Pretzels. Breadsticks. Rusks. Low-fat snacks. Low-fat crackers, including oyster, saltine, matzo, graham, animal, and rye. Rice and pasta, including brown rice and pastas that are made with whole wheat. Vegetables All vegetables. Fruits All fruits, but limit coconut. Meats and Other Protein Sources Lean, well-trimmed beef, veal, pork, and lamb. Chicken and turkey without skin. All fish and shellfish.   Wild duck, rabbit, pheasant, and venison. Egg whites or low-cholesterol egg substitutes. Dried beans, peas, lentils, and tofu. Seeds and most nuts. Dairy Low-fat or nonfat cheeses, including ricotta, string, and mozzarella. Skim or 1% milk that is liquid, powdered, or evaporated. Buttermilk that is made with low-fat milk. Nonfat or low-fat  yogurt. Beverages Mineral water. Diet carbonated beverages. Sweets and Desserts Sherbets and fruit ices. Honey, jam, marmalade, jelly, and syrups. Meringues and gelatins. Pure sugar candy, such as hard candy, jelly beans, gumdrops, mints, marshmallows, and small amounts of dark chocolate. [employer] Eat all sweets and desserts in moderation. Fats and Oils Nonhydrogenated (trans-free) margarines. Vegetable oils, including soybean, sesame, sunflower, olive, peanut, safflower, corn, canola, and cottonseed. Salad dressings or mayonnaise made with a vegetable oil. Limit added fats and oils that you use for cooking, baking, salads, and as spreads. Other Cocoa powder. Coffee and tea. All seasonings and condiments. The items listed above may not be a complete list of recommended foods or beverages. Contact your dietitian for more options. What foods are not recommended? Grains Breads that are made with saturated or trans fats, oils, or whole milk. Croissants. Butter rolls. Cheese breads. Sweet rolls. Donuts. Buttered popcorn. Chow mein noodles. High-fat crackers, such as cheese or butter crackers. Meats and Other Protein Sources Fatty meats, such as hotdogs, short ribs, sausage, spareribs, bacon, rib eye roast or steak, and mutton. High-fat deli meats, such as salami and bologna. Caviar. Domestic duck and goose. Organ meats, such as kidney, liver, sweetbreads, and heart. Dairy Cream, sour cream, cream cheese, and creamed cottage cheese. Whole-milk cheeses, including blue (bleu), Monte[place] Jack, [place], [place], American, [place], Swiss, cheddar, [place], and [place]. Whole or 2% milk that is liquid, evaporated, or condensed. Whole buttermilk. Cream sauce or high-fat cheese sauce. Yogurt that is made from whole milk. Beverages Regular sodas and juice drinks with added sugar. Sweets and Desserts Frosting. Pudding. Cookies. Cakes other than angel food cake. Candy that has milk chocolate or white  chocolate, hydrogenated fat, butter, coconut, or unknown ingredients. Buttered syrups. Full-fat ice cream or ice cream drinks. Fats and Oils Gravy that has suet, meat fat, or shortening. Cocoa butter, hydrogenated oils, palm oil, coconut oil, palm kernel oil. These can often be found in baked products, candy, fried foods, nondairy creamers, and whipped toppings. Solid fats and shortenings, including bacon fat, salt pork, lard, and butter. Nondairy cream substitutes, such as coffee creamers and sour cream substitutes. Salad dressings that are made of unknown oils, cheese, or sour cream. The items listed above may not be a complete list of foods and beverages to avoid. Contact your dietitian for more information. This information is not intended to replace advice given to you by your health care provider. Make sure you discuss any questions you have with your health care provider. Document Released: 01/30/2012 Document Revised: 01/06/2016 Document Reviewed: 01/22/2014 Elsevier Interactive Patient Education  2018 [employer].   Exercising to [employer] Exercising can help you to lose weight. In order to lose weight through exercise, you need to do vigorous-intensity exercise. You can tell that you are exercising with vigorous intensity if you are breathing very hard and fast and cannot hold a conversation while exercising. Moderate-intensity exercise helps to maintain your current weight. You can tell that you are exercising at a moderate level if you have a higher heart rate and faster breathing, but you are still able to hold a conversation. How often should I exercise? Choose an activity that you enjoy  and set realistic goals. Your health care provider can help you to make an activity plan that works for you. Exercise regularly as directed by your health care provider. This may include:  Doing resistance training twice each week, such as: ? Push-ups. ? Sit-ups. ? Lifting weights. ? Using  resistance bands.  Doing a given intensity of exercise for a given amount of time. Choose from these options: ? 150 minutes of moderate-intensity exercise every week. ? 75 minutes of vigorous-intensity exercise every week. ? A mix of moderate-intensity and vigorous-intensity exercise every week.  Children, pregnant women, people who are out of shape, people who are overweight, and older adults may need to consult a health care provider for individual recommendations. If you have any sort of medical condition, be sure to consult your health care provider before starting a new exercise program. What are some activities that can help me to lose weight?  Walking at a rate of at least 4.5 miles an hour.  Jogging or running at a rate of 5 miles per hour.  Biking at a rate of at least 10 miles per hour.  Lap swimming.  Roller-skating or in-line skating.  Cross-country skiing.  Vigorous competitive sports, such as football, basketball, and soccer.  Jumping rope.  Aerobic dancing. How can I be more active in my day-to-day activities?  Use the stairs instead of the elevator.  Take a walk during your lunch break.  If you drive, park your car farther away from work or school.  If you take public transportation, get off one stop early and walk the rest of the way.  Make all of your phone calls while standing up and walking around.  Get up, stretch, and walk around every 30 minutes throughout the day. What guidelines should I follow while exercising?  Do not exercise so much that you hurt yourself, feel dizzy, or get very short of breath.  Consult your health care provider prior to starting a new exercise program.  Wear comfortable clothes and shoes with good support.  Drink plenty of water while you exercise to prevent dehydration or heat stroke. Body water is lost during exercise and must be replaced.  Work out until you breathe faster and your heart beats faster. This  information is not intended to replace advice given to you by your health care provider. Make sure you discuss any questions you have with your health care provider. Document Released: 09/02/2010 Document Revised: 01/06/2016 Document Reviewed: 01/01/2014 Elsevier Interactive Patient Education  2018 [place]  Month supply of Phentermine sent in, when you need refill please call office and we will send in another month. Follow Heart Healthy diet Increase regular exercise.  Recommend at least 30 minutes daily, 5 days per week of walking, jogging, biking, swimming, YouTube/Pinterest workout videos. Follow-up in 2 months. GREAT JOB!!! NICE TO SEE YOU!

## 2017-11-05 NOTE — Assessment & Plan Note (Signed)
Body mass index is 35.92 kg/m.  She has lost 10 lbs in 4 weeks! [place] Controlled Substance Database reviewed- no aberrances noted. Month supply of Phentermine sent in, when you need refill please call office and we will send in another month. Follow Heart Healthy diet Increase regular exercise.  Recommend at least 30 minutes daily, 5 days per week of walking, jogging, biking, swimming, YouTube/Pinterest workout videos. Follow-up in 2 months.

## 2017-11-13 ENCOUNTER — Telehealth: Payer: Self-pay

## 2017-11-13 ENCOUNTER — Other Ambulatory Visit: Payer: Self-pay | Admitting: Adult Health

## 2017-11-13 ENCOUNTER — Telehealth: Payer: Self-pay | Admitting: Adult Health

## 2017-11-13 MED ORDER — PHENTERMINE HCL 37.5 MG PO TABS: 37.5000 mg | ORAL_TABLET | Freq: Every day | ORAL | 0 refills | Status: DC

## 2017-11-13 NOTE — Telephone Encounter (Signed)
Pharmacy sent over a medication change per patient request.  Patient request a new Phentermine RX for tablets not capsule. Please review. MPulliam, CMA/RT(R)

## 2017-11-13 NOTE — Telephone Encounter (Signed)
She filled capsule on 10/08/17 and 11/05/17 Is she requesting tablet vs capsule on 12/06/17? If so, she will have to wait until refill appropriate and how pharmacy reach out to [place] at end of March. Thanks! Valetta Fuller

## 2017-11-13 NOTE — Telephone Encounter (Signed)
Patient states requested  This Rx: phentermine 37.5 MG capsule [[id number]]   Order Details  Dose: 37.5 mg Route: Oral Frequency: BH-each morning  Indications of Use: Exogenous Obesity  Dispense Quantity: 30 capsule Refills: 0 Fills remaining: --        Sig: Take 1 capsule (37.5 mg total) by mouth every morning.          ----( Rx should have been in tablet form not Capsule per patient, pls contact Pharmacy to change to TABLETS)   Patient uses: Preferred Pharmacies      [id number] [address], [place]. [phone] (Phone) [phone] (Fax)     --Dion Body

## 2017-11-13 NOTE — Telephone Encounter (Signed)
I called and spoke to the patient she states that she is wanting tablets vs capsules because they are cheaper for her.  Patient states that the last RX she picked up was the one for 10/08/2017.  I pulled her [facility] report and verified that the last RX was filled on 10/08/2017.  I also called the pharmacy and verified that RX for 11/05/2017 is canceled and that if we send in new RX for tablets that will be the only active. In Epic the 12/06/17 RX failed to be transmitted and I verified with pharmacy that they did not receive. New RX for tablets needs to be sent for the patient.  Thank you. MPulliam, CMA/RT(R)

## 2017-12-04 NOTE — Progress Notes (Deleted)
   Subjective:    Patient ID: Cindy Figueroa, female    DOB: 1950-09-07, 67 y.o.   MRN: [id number]  HPI:  [name] is here for CPE She was started on Phentermine 37.5mg  QD She has lost      Review of Systems     Objective:   Physical Exam        Assessment & Plan:

## 2017-12-06 ENCOUNTER — Encounter: Payer: Self-pay | Admitting: Adult Health

## 2017-12-06 ENCOUNTER — Ambulatory Visit (INDEPENDENT_AMBULATORY_CARE_PROVIDER_SITE_OTHER): Payer: Medicare HMO | Admitting: Adult Health

## 2017-12-06 VITALS — BP 113/68 | HR 86 | Ht 65.0 in | Wt 192.0 lb

## 2017-12-06 DIAGNOSIS — E559 Vitamin D deficiency, unspecified: Secondary | ICD-10-CM

## 2017-12-06 DIAGNOSIS — I10 Essential (primary) hypertension: Secondary | ICD-10-CM | POA: Diagnosis not present

## 2017-12-06 DIAGNOSIS — E785 Hyperlipidemia, unspecified: Secondary | ICD-10-CM

## 2017-12-06 DIAGNOSIS — R7309 Other abnormal glucose: Secondary | ICD-10-CM | POA: Diagnosis not present

## 2017-12-06 DIAGNOSIS — Z0000 Encounter for general adult medical examination without abnormal findings: Secondary | ICD-10-CM

## 2017-12-06 NOTE — Assessment & Plan Note (Signed)
You are doing a great job taking care of yourself! Continue healthy eating and regular movement. Continue all medications as directed. Please complete colorgaurd per package instructions. Recommend follow-up in 6 months for complete physical

## 2017-12-06 NOTE — Patient Instructions (Addendum)
Cindy Figueroa , Thank you for taking time to come for your Medicare Wellness Visit. I appreciate your ongoing commitment to your health goals. Please review the following plan we discussed and let me know if I can assist you in the future.   These are the goals we discussed:  Continue healthy eating and regular movement. Continue all medications as directed. Please complete colorgaurd per package instructions. Recommend follow-up in 6 months for physical.  This is a list of the screening recommended for you and due dates:  Health Maintenance  Topic Date Due  . Mammogram  05/14/2018*  . DEXA scan (bone density measurement)  05/14/2018*  . Colon Cancer Screening  05/14/2018*  .  Hepatitis C: One time screening is recommended by Center for Disease Control  (CDC) for  adults born from [birth year] through 1966.   05/14/2018*  . Pneumonia vaccines (1 of 2 - PCV13) 05/14/2018*  . Flu Shot  03/14/2018  . Tetanus Vaccine  12/27/2026  *Topic was postponed. The date shown is not the original due date.    Mediterranean Diet A Mediterranean diet refers to food and lifestyle choices that are based on the traditions of countries located on the [employer]. This way of eating has been shown to help prevent certain conditions and improve outcomes for people who have chronic diseases, like kidney disease and heart disease. What are tips for following this plan? Lifestyle  Cook and eat meals together with your family, when possible.  Drink enough fluid to keep your urine clear or pale yellow.  Be physically active every day. This includes: ? Aerobic exercise like running or swimming. ? Leisure activities like gardening, walking, or housework.  Get 7-8 hours of sleep each night.  If recommended by your health care provider, drink red wine in moderation. This means 1 glass a day for nonpregnant women and 2 glasses a day for men. A glass of wine equals 5 oz (150 mL). Reading food labels  Check the  serving size of packaged foods. For foods such as rice and pasta, the serving size refers to the amount of cooked product, not dry.  Check the total fat in packaged foods. Avoid foods that have saturated fat or trans fats.  Check the ingredients list for added sugars, such as corn syrup. Shopping  At the grocery store, buy most of your food from the areas near the walls of the store. This includes: ? Fresh fruits and vegetables (produce). ? Grains, beans, nuts, and seeds. Some of these may be available in unpackaged forms or large amounts (in bulk). ? Fresh seafood. ? Poultry and eggs. ? Low-fat dairy products.  Buy whole ingredients instead of prepackaged foods.  Buy fresh fruits and vegetables in-season from local farmers markets.  Buy frozen fruits and vegetables in resealable bags.  If you do not have access to quality fresh seafood, buy precooked frozen shrimp or canned fish, such as tuna, salmon, or sardines.  Buy small amounts of raw or cooked vegetables, salads, or olives from the deli or salad bar at your store.  Stock your pantry so you always have certain foods on hand, such as olive oil, canned tuna, canned tomatoes, rice, pasta, and beans. Cooking  Cook foods with extra-virgin olive oil instead of using butter or other vegetable oils.  Have meat as a side dish, and have vegetables or grains as your main dish. This means having meat in small portions or adding small amounts of meat to foods like pasta  or stew.  Use beans or vegetables instead of meat in common dishes like chili or lasagna.  Experiment with different cooking methods. Try roasting or broiling vegetables instead of steaming or sauteing them.  Add frozen vegetables to soups, stews, pasta, or rice.  Add nuts or seeds for added healthy fat at each meal. You can add these to yogurt, salads, or vegetable dishes.  Marinate fish or vegetables using olive oil, lemon juice, garlic, and fresh herbs. Meal  planning  Plan to eat 1 vegetarian meal one day each week. Try to work up to 2 vegetarian meals, if possible.  Eat seafood 2 or more times a week.  Have healthy snacks readily available, such as: ? Vegetable sticks with hummus. ? [place] yogurt. ? Fruit and nut trail mix.  Eat balanced meals throughout the week. This includes: ? Fruit: 2-3 servings a day ? Vegetables: 4-5 servings a day ? Low-fat dairy: 2 servings a day ? Fish, poultry, or lean meat: 1 serving a day ? Beans and legumes: 2 or more servings a week ? Nuts and seeds: 1-2 servings a day ? Whole grains: 6-8 servings a day ? Extra-virgin olive oil: 3-4 servings a day  Limit red meat and sweets to only a few servings a month What are my food choices?  Mediterranean diet ? Recommended ? Grains: Whole-grain pasta. Brown rice. Bulgar wheat. Polenta. Couscous. Whole-wheat bread. [place]. ? Vegetables: Artichokes. Beets. Broccoli. Cabbage. Carrots. Eggplant. Green beans. Chard. Kale. Spinach. Onions. Leeks. Peas. Squash. Tomatoes. Peppers. Radishes. ? Fruits: Apples. Apricots. Avocado. Berries. Bananas. Cherries. Dates. Figs. Grapes. Lemons. Melon. Oranges. Peaches. Plums. Pomegranate. ? Meats and other protein foods: Beans. Almonds. Sunflower seeds. Pine nuts. Peanuts. [place]. Salmon. Scallops. Shrimp. [place]. Tilapia. Clams. Oysters. Eggs. ? Dairy: Low-fat milk. Cheese. Greek yogurt. ? Beverages: Water. Red wine. Herbal tea. ? Fats and oils: Extra virgin olive oil. Avocado oil. Grape seed oil. ? Sweets and desserts: [place] yogurt with honey. Baked apples. Poached pears. Trail mix. ? Seasoning and other foods: Basil. Cilantro. Coriander. Cumin. Mint. Parsley. Sage. Rosemary. Tarragon. Garlic. Oregano. Thyme. Pepper. Balsalmic vinegar. Tahini. Hummus. Tomato sauce. Olives. Mushrooms. ? Limit these ? Grains: Prepackaged pasta or rice dishes. Prepackaged cereal with added sugar. ? Vegetables: Deep fried potatoes (french  fries). ? Fruits: Fruit canned in syrup. ? Meats and other protein foods: Beef. Pork. Lamb. Poultry with skin. Hot dogs. [place] Salines. ? Dairy: Ice cream. Sour cream. Whole milk. ? Beverages: Juice. Sugar-sweetened soft drinks. Beer. Liquor and spirits. ? Fats and oils: Butter. Canola oil. Vegetable oil. Beef fat (tallow). Lard. ? Sweets and desserts: Cookies. Cakes. Pies. Candy. ? Seasoning and other foods: Mayonnaise. Premade sauces and marinades. ? The items listed may not be a complete list. Talk with your dietitian about what dietary choices are right for you. Summary  The Mediterranean diet includes both food and lifestyle choices.  Eat a variety of fresh fruits and vegetables, beans, nuts, seeds, and whole grains.  Limit the amount of red meat and sweets that you eat.  Talk with your health care provider about whether it is safe for you to drink red wine in moderation. This means 1 glass a day for nonpregnant women and 2 glasses a day for men. A glass of wine equals 5 oz (150 mL). This information is not intended to replace advice given to you by your health care provider. Make sure you discuss any questions you have with your health care provider. Document Released: 03/23/2016 Document  Revised: 04/25/2016 Document Reviewed: 03/23/2016 Elsevier Interactive Patient Education  2018 [employer].   Exercising to [employer] Exercising can help you to lose weight. In order to lose weight through exercise, you need to do vigorous-intensity exercise. You can tell that you are exercising with vigorous intensity if you are breathing very hard and fast and cannot hold a conversation while exercising. Moderate-intensity exercise helps to maintain your current weight. You can tell that you are exercising at a moderate level if you have a higher heart rate and faster breathing, but you are still able to hold a conversation. How often should I exercise? Choose an activity that you enjoy and set  realistic goals. Your health care provider can help you to make an activity plan that works for you. Exercise regularly as directed by your health care provider. This may include:  Doing resistance training twice each week, such as: ? Push-ups. ? Sit-ups. ? Lifting weights. ? Using resistance bands.  Doing a given intensity of exercise for a given amount of time. Choose from these options: ? 150 minutes of moderate-intensity exercise every week. ? 75 minutes of vigorous-intensity exercise every week. ? A mix of moderate-intensity and vigorous-intensity exercise every week.  Children, pregnant women, people who are out of shape, people who are overweight, and older adults may need to consult a health care provider for individual recommendations. If you have any sort of medical condition, be sure to consult your health care provider before starting a new exercise program. What are some activities that can help me to lose weight?  Walking at a rate of at least 4.5 miles an hour.  Jogging or running at a rate of 5 miles per hour.  Biking at a rate of at least 10 miles per hour.  Lap swimming.  Roller-skating or in-line skating.  Cross-country skiing.  Vigorous competitive sports, such as football, basketball, and soccer.  Jumping rope.  Aerobic dancing. How can I be more active in my day-to-day activities?  Use the stairs instead of the elevator.  Take a walk during your lunch break.  If you drive, park your car farther away from work or school.  If you take public transportation, get off one stop early and walk the rest of the way.  Make all of your phone calls while standing up and walking around.  Get up, stretch, and walk around every 30 minutes throughout the day. What guidelines should I follow while exercising?  Do not exercise so much that you hurt yourself, feel dizzy, or get very short of breath.  Consult your health care provider prior to starting a new  exercise program.  Wear comfortable clothes and shoes with good support.  Drink plenty of water while you exercise to prevent dehydration or heat stroke. Body water is lost during exercise and must be replaced.  Work out until you breathe faster and your heart beats faster. This information is not intended to replace advice given to you by your health care provider. Make sure you discuss any questions you have with your health care provider. Document Released: 09/02/2010 Document Revised: 01/06/2016 Document Reviewed: 01/01/2014 Elsevier Interactive Patient Education  2018 [place] are doing a great job taking care of yourself! Continue healthy eating and regular movement. Continue all medications as directed. Please complete colorgaurd per package instructions. Recommend follow-up in 6 months for complete physical NICE TO SEE YOU!

## 2017-12-06 NOTE — Progress Notes (Signed)
Subjective:    Patient ID: Cindy Figueroa, female    DOB: 24-Mar-1951, 67 y.o.   MRN: [id number]  HPI:  Cindy Figueroa presents for subsequent Medicare Wellness visit. She is compliant on Hyzaar and denies acute cardiac sx's. She has been taking Phentermine a few times a week and reports it helping reduce nighttime cravings/snacking. She has been following heart healthy diet and drinks >68 oz water/day She walks daily and performs constant house/yard work and cares for three very large dogs. Review of Systems  Constitutional: Positive for fatigue. Negative for activity change, appetite change, chills, diaphoresis, fever and unexpected weight change.  HENT: Negative for congestion.   Eyes: Negative for visual disturbance.  Respiratory: Negative for cough, chest tightness, shortness of breath, wheezing and stridor.   Cardiovascular: Negative for chest pain, palpitations and leg swelling.  Gastrointestinal: Negative for abdominal distention, abdominal pain, blood in stool, constipation, diarrhea, nausea and vomiting.  Endocrine: Negative for cold intolerance, heat intolerance, polydipsia, polyphagia and polyuria.  Genitourinary: Negative for difficulty urinating, flank pain and hematuria.  Musculoskeletal: Positive for arthralgias and myalgias. Negative for back pain, gait problem, joint swelling, neck pain and neck stiffness.  Skin: Negative for color change, pallor, rash and wound.  Neurological: Negative for dizziness and headaches.  Hematological: Does not bruise/bleed easily.  Psychiatric/Behavioral: Negative for dysphoric mood, hallucinations, self-injury, sleep disturbance and suicidal ideas. The patient is not nervous/anxious and is not hyperactive.        Objective:   Physical Exam  Constitutional: She is oriented to person, place, and time. She appears well-developed and well-nourished. No distress.  HENT:  Head: Normocephalic and atraumatic.  Right Ear: External ear normal.   Left Ear: External ear normal.  Neurological: She is alert and oriented to person, place, and time.  Skin: Skin is warm and dry. No rash noted. She is not diaphoretic. No erythema. No pallor.  Psychiatric: She has a normal mood and affect. Her behavior is normal. Judgment and thought content normal.  Nursing note and vitals reviewed.     Assessment & Plan:   1. Hypertension, unspecified type   2. Vitamin D deficiency   3. Hyperlipidemia, unspecified hyperlipidemia type   4. Healthcare maintenance   5. Encounter for Medicare annual wellness exam     Encounter for Medicare annual wellness exam You are doing a great job taking care of yourself! Continue healthy eating and regular movement. Continue all medications as directed. Please complete colorgaurd per package instructions. Recommend follow-up in 6 months for complete physical  HTN (hypertension) BP at goal 113/68, HR 86 Continue Losartan/HCTZ 50/12.5mg  QD    FOLLOW-UP:  Return in 6 months (on 06/07/2018) for CPE.   Subjective:   Cindy Figueroa is a 67 y.o. female who presents for Medicare Annual (Subsequent) preventive examination.  Review of Systems:  See Above ROS    Objective:     Vitals: BP 113/68   Pulse 86   Ht 5\' 5"  (1.651 m)   Wt 192 lb (87.1 kg)   SpO2 96% Comment: on RA  BMI 31.95 kg/m   Body mass index is 31.95 kg/m.  Advanced Directives 12/26/2016  Does Patient Have a Medical Advance Directive? No    Tobacco Social History   Tobacco Use  Smoking Status Never Smoker  Smokeless Tobacco Never Used     Counseling given: Not Answered  Past Medical History:  Diagnosis Date  . Hypertension    History reviewed. No pertinent surgical history. Family History  Problem  Relation Age of Onset  . Hyperlipidemia Mother   . Hypertension Mother   . Cancer Father   . Emphysema Father    Social History   Socioeconomic History  . Marital status: Married    Spouse name: Not on file  .  Number of children: Not on file  . Years of education: Not on file  . Highest education level: Not on file  Occupational History  . Not on file  Social Needs  . Financial resource strain: Not on file  . Food insecurity:    Worry: Not on file    Inability: Not on file  . Transportation needs:    Medical: Not on file    Non-medical: Not on file  Tobacco Use  . Smoking status: Never Smoker  . Smokeless tobacco: Never Used  Substance and Sexual Activity  . Alcohol use: No  . Drug use: No  . Sexual activity: Not on file  Lifestyle  . Physical activity:    Days per week: Not on file    Minutes per session: Not on file  . Stress: Not on file  Relationships  . Social connections:    Talks on phone: Not on file    Gets together: Not on file    Attends religious service: Not on file    Active member of club or organization: Not on file    Attends meetings of clubs or organizations: Not on file    Relationship status: Not on file  Other Topics Concern  . Not on file  Social History Narrative  . Not on file    Outpatient Encounter Medications as of 12/06/2017  Medication Sig  . Iron-Vitamins (GERITOL PO) Take 1 tablet by mouth daily.  Marland Kitchen losartan-hydrochlorothiazide (HYZAAR) 50-12.5 MG tablet Take 1 tablet by mouth daily.  Marland Kitchen OVER THE COUNTER MEDICATION Take 2 capsules by mouth daily. Tricardia  . phentermine (ADIPEX-P) 37.5 MG tablet Take 1 tablet (37.5 mg total) by mouth daily before breakfast.  . [DISCONTINUED] OVER THE COUNTER MEDICATION 2 capsules daily.    No facility-administered encounter medications on file as of 12/06/2017.     Activities of Daily Living In your present state of health, do you have any difficulty performing the following activities: 12/06/2017  Hearing? Y  Vision? N  Difficulty concentrating or making decisions? N  Walking or climbing stairs? N  Dressing or bathing? N  Doing errands, shopping? N  Some recent data might be hidden    Patient Care  Team: Esaw Grandchild, NP as PCP - General (Family Medicine) Allyn Kenner, MD (Dermatology) Starling Manns, MD (Orthopedic Surgery)    Assessment:   This is a routine wellness examination for Cindy Figueroa.  Exercise Activities and Dietary recommendations Current Exercise Habits: Home exercise routine, Type of exercise: walking;calisthenics, Intensity: Moderate  Goals    None      Fall Risk Fall Risk  03/13/2017  Falls in the past year? No   Is the patient's home free of loose throw rugs in walkways, pet beds, electrical cords, etc?   yes      Grab bars in the bathroom? no      Handrails on the stairs?   yes      Adequate lighting?   yes  Timed Get Up and Go performed: Normal   Depression Screen PHQ 2/9 Scores 12/06/2017 11/05/2017 09/27/2017 06/06/2017  PHQ - 2 Score 0 0 3 1  PHQ- 9 Score 0 0 5 4  Cognitive Function Excellent  Immunization History  Administered Date(s) Administered  . Tdap 12/26/2016    Qualifies for Shingles Vaccine?  Screening Tests Health Maintenance  Topic Date Due  . MAMMOGRAM  05/14/2018 (Originally 04/03/2000)  . DEXA SCAN  05/14/2018 (Originally 04/04/2015)  . COLONOSCOPY  05/14/2018 (Originally 04/03/2000)  . Hepatitis C Screening  05/14/2018 (Originally 03/21/1951)  . PNA vac Low Risk Adult (1 of 2 - PCV13) 05/14/2018 (Originally 04/04/2015)  . INFLUENZA VACCINE  03/14/2018  . TETANUS/TDAP  12/27/2026    Cancer Screenings: Lung: Low Dose CT Chest recommended if Age 73-80 years, 30 pack-year currently smoking OR have quit w/in 15years. Patient does not qualify. Breast:  Up to date on Mammogram? No   Up to date of Bone Density/Dexa? No Colorectal: declined colonoscopy, requested Cologuard- order placed We discussed screening guidelines and she declined all measures  Additional Screenings:  Hepatitis C Screening:  Declined    Plan:    Continue healthy eating and regular movement. Continue all medications as directed. Please complete  colorgaurd per package instructions. Recommend follow-up in 6 months for physical.  I have personally reviewed and noted the following in the patient's chart:   . Medical and social history . Use of alcohol, tobacco or illicit drugs  . Current medications and supplements . Functional ability and status . Nutritional status . Physical activity . Advanced directives . List of other physicians . Hospitalizations, surgeries, and ER visits in previous 12 months . Vitals . Screenings to include cognitive, depression, and falls . Referrals and appointments  In addition, I have reviewed and discussed with patient certain preventive protocols, quality metrics, and best practice recommendations. A written personalized care plan for preventive services as well as general preventive health recommendations were provided to patient.     Esaw Grandchild, NP  12/06/2017

## 2017-12-06 NOTE — Assessment & Plan Note (Signed)
BP at goal 113/68, HR 86 Continue Losartan/HCTZ 50/12.5mg  QD

## 2017-12-07 LAB — CBC WITH DIFFERENTIAL/PLATELET
Basophils Absolute: 0 10*3/uL (ref 0.0–0.2)
Basos: 0 %
EOS (ABSOLUTE): 0.1 10*3/uL (ref 0.0–0.4)
Eos: 2 %
Hematocrit: 42.6 % (ref 34.0–46.6)
Hemoglobin: 14.1 g/dL (ref 11.1–15.9)
Immature Grans (Abs): 0 10*3/uL (ref 0.0–0.1)
Immature Granulocytes: 0 %
Lymphocytes Absolute: 1.8 10*3/uL (ref 0.7–3.1)
Lymphs: 28 %
MCH: 28.8 pg (ref 26.6–33.0)
MCHC: 33.1 g/dL (ref 31.5–35.7)
MCV: 87 fL (ref 79–97)
Monocytes Absolute: 0.5 10*3/uL (ref 0.1–0.9)
Monocytes: 8 %
Neutrophils Absolute: 3.9 10*3/uL (ref 1.4–7.0)
Neutrophils: 62 %
Platelets: 284 10*3/uL (ref 150–379)
RBC: 4.9 x10E6/uL (ref 3.77–5.28)
RDW: 13.7 % (ref 12.3–15.4)
WBC: 6.3 10*3/uL (ref 3.4–10.8)

## 2017-12-07 LAB — COMPREHENSIVE METABOLIC PANEL
ALT: 20 IU/L (ref 0–32)
AST: 17 IU/L (ref 0–40)
Albumin/Globulin Ratio: 2.1 (ref 1.2–2.2)
Albumin: 4.1 g/dL (ref 3.6–4.8)
Alkaline Phosphatase: 65 IU/L (ref 39–117)
BUN/Creatinine Ratio: 17 (ref 12–28)
BUN: 10 mg/dL (ref 8–27)
Bilirubin Total: 0.8 mg/dL (ref 0.0–1.2)
CO2: 25 mmol/L (ref 20–29)
Calcium: 9.3 mg/dL (ref 8.7–10.3)
Chloride: 104 mmol/L (ref 96–106)
Creatinine, Ser: 0.6 mg/dL (ref 0.57–1.00)
GFR calc Af Amer: 109 mL/min/{1.73_m2} (ref >59)
GFR calc non Af Amer: 95 mL/min/{1.73_m2} (ref >59)
Globulin, Total: 2 g/dL (ref 1.5–4.5)
Glucose: 99 mg/dL (ref 65–99)
Potassium: 4 mmol/L (ref 3.5–5.2)
Sodium: 144 mmol/L (ref 134–144)
Total Protein: 6.1 g/dL (ref 6.0–8.5)

## 2017-12-07 LAB — HEMOGLOBIN A1C
Est. average glucose Bld gHb Est-mCnc: 108 mg/dL
Hgb A1c MFr Bld: 5.4 % (ref 4.8–5.6)

## 2017-12-07 LAB — LIPID PANEL
Chol/HDL Ratio: 4.1 ratio (ref 0.0–4.4)
Cholesterol, Total: 183 mg/dL (ref 100–199)
HDL: 45 mg/dL (ref >39)
LDL Calculated: 125 mg/dL — ABNORMAL HIGH (ref 0–99)
Triglycerides: 66 mg/dL (ref 0–149)
VLDL Cholesterol Cal: 13 mg/dL (ref 5–40)

## 2017-12-07 LAB — VITAMIN D 25 HYDROXY (VIT D DEFICIENCY, FRACTURES): Vit D, 25-Hydroxy: 33.8 ng/mL (ref 30.0–100.0)

## 2017-12-07 LAB — TSH: TSH: 1.06 u[IU]/mL (ref 0.450–4.500)

## 2017-12-10 ENCOUNTER — Telehealth: Payer: Self-pay

## 2017-12-10 NOTE — Telephone Encounter (Signed)
Received fax from [employer] that diagnosis code provided on order, Z00.00 is not covered by the pt's insurance.  Public librarian and provided new diagnosis code to be Z12.11.  Charyl Bigger, CMA

## 2017-12-18 DIAGNOSIS — Z1211 Encounter for screening for malignant neoplasm of colon: Secondary | ICD-10-CM | POA: Diagnosis not present

## 2017-12-18 LAB — COLOGUARD: Cologuard: POSITIVE

## 2017-12-31 ENCOUNTER — Telehealth: Payer: Self-pay

## 2017-12-31 DIAGNOSIS — R195 Other fecal abnormalities: Secondary | ICD-10-CM

## 2017-12-31 NOTE — Telephone Encounter (Signed)
Pt informed of positive Cologuard and need for colonoscopy.  Order placed.  Advised pt to call Dorothea Ogle at our office if she has not heard anything about an appointment by 01/04/18.  Charyl Bigger, CMA

## 2017-12-31 NOTE — Telephone Encounter (Signed)
Positive Cologuard test.  Per Mina Marble, pt needs referral placed to GI for Colonoscopy.  Charyl Bigger, CMA

## 2018-01-08 ENCOUNTER — Ambulatory Visit: Payer: Medicare HMO | Admitting: Adult Health

## 2018-01-31 ENCOUNTER — Encounter: Payer: Self-pay | Admitting: Adult Health

## 2018-02-06 DIAGNOSIS — L820 Inflamed seborrheic keratosis: Secondary | ICD-10-CM | POA: Diagnosis not present

## 2018-02-06 DIAGNOSIS — L570 Actinic keratosis: Secondary | ICD-10-CM | POA: Diagnosis not present

## 2018-02-06 DIAGNOSIS — X32XXXD Exposure to sunlight, subsequent encounter: Secondary | ICD-10-CM | POA: Diagnosis not present

## 2018-02-06 DIAGNOSIS — D485 Neoplasm of uncertain behavior of skin: Secondary | ICD-10-CM | POA: Diagnosis not present

## 2018-02-09 ENCOUNTER — Other Ambulatory Visit: Payer: Self-pay | Admitting: Adult Health

## 2018-02-11 NOTE — Telephone Encounter (Signed)
Patient called states pharmacy advised her provider will have to order Rx refill:  phentermine (ADIPEX-P) 37.5 MG tablet [[id number]]   Order Details  Dose: 37.5 mg Route: Oral Frequency: Daily before breakfast  Dispense Quantity: 30 tablet Refills: 0 Fills remaining: --        Sig: Take 1 tablet (37.5 mg total) by mouth daily before breakfast.          Please send to:  Preferred Pharmacies      [id number] [address], [place]. [phone] (Phone) [phone] (Fax)     ----Forwarding message to medical assistant.  --Dion Body

## 2018-02-18 ENCOUNTER — Other Ambulatory Visit: Payer: Self-pay | Admitting: Adult Health

## 2018-02-18 MED ORDER — PHENTERMINE HCL 37.5 MG PO TABS: 37.5000 mg | ORAL_TABLET | Freq: Every day | ORAL | 0 refills | Status: DC

## 2018-02-18 NOTE — Telephone Encounter (Signed)
Patient called again check status of refill request on:  phentermine (ADIPEX-P) 37.5 MG tablet [[id number]]   Order Details  Dose: 37.5 mg Route: Oral Frequency: Daily before breakfast  Dispense Quantity: 30 tablet Refills: 0 Fills remaining: --        Sig: Take 1 tablet (37.5 mg total) by mouth daily before breakfast.     --Pt states is on vacation on [place] and husband can be called w/ Rx refill information-- advised pt forwarding her request to medical assistant for processing.  --glh

## 2018-02-18 NOTE — Telephone Encounter (Signed)
Please review and refill if appropriate.  T. Nelson, CMA  

## 2018-02-19 ENCOUNTER — Telehealth: Payer: Self-pay

## 2018-02-19 NOTE — Telephone Encounter (Signed)
LVM for pt to call to discuss.  T. Nelson, CMA  

## 2018-02-19 NOTE — Telephone Encounter (Signed)
-----   Message from Esaw Grandchild, NP sent at 02/18/2018  1:00 PM EDT ----- Brand Males, Can you please call Ms. Hallinan and share- I sent in Phentermine RF. We will allow a final 3 month course. I checked Controlled Substance Database and no aberrancies noted. Thanks! Valetta Fuller

## 2018-02-20 ENCOUNTER — Telehealth: Payer: Self-pay | Admitting: Adult Health

## 2018-02-20 NOTE — Telephone Encounter (Signed)
See additional phone note.  T. Madsen Riddle, CMA 

## 2018-02-20 NOTE — Telephone Encounter (Signed)
Patient states medical assistant left message for her to call office.  --Forwarding message to [place].  --glh

## 2018-02-21 NOTE — Telephone Encounter (Signed)
Medical assistant & patient keep missing each other with phone calls.  --glh

## 2018-02-21 NOTE — Telephone Encounter (Signed)
Pt informed.  T. Dajohn Ellender, CMA 

## 2018-05-17 DIAGNOSIS — L821 Other seborrheic keratosis: Secondary | ICD-10-CM | POA: Diagnosis not present

## 2018-05-17 DIAGNOSIS — D485 Neoplasm of uncertain behavior of skin: Secondary | ICD-10-CM | POA: Diagnosis not present

## 2018-05-17 DIAGNOSIS — Z1283 Encounter for screening for malignant neoplasm of skin: Secondary | ICD-10-CM | POA: Diagnosis not present

## 2018-06-06 ENCOUNTER — Encounter: Payer: Self-pay | Admitting: Adult Health

## 2018-06-06 ENCOUNTER — Ambulatory Visit (INDEPENDENT_AMBULATORY_CARE_PROVIDER_SITE_OTHER): Payer: Medicare HMO | Admitting: Adult Health

## 2018-06-06 VITALS — BP 132/80 | HR 78 | Ht 65.0 in | Wt 197.2 lb

## 2018-06-06 DIAGNOSIS — E2839 Other primary ovarian failure: Secondary | ICD-10-CM | POA: Diagnosis not present

## 2018-06-06 DIAGNOSIS — I10 Essential (primary) hypertension: Secondary | ICD-10-CM

## 2018-06-06 DIAGNOSIS — Z0000 Encounter for general adult medical examination without abnormal findings: Secondary | ICD-10-CM

## 2018-06-06 DIAGNOSIS — Z1239 Encounter for other screening for malignant neoplasm of breast: Secondary | ICD-10-CM | POA: Diagnosis not present

## 2018-06-06 DIAGNOSIS — Z780 Asymptomatic menopausal state: Secondary | ICD-10-CM | POA: Diagnosis not present

## 2018-06-06 DIAGNOSIS — E785 Hyperlipidemia, unspecified: Secondary | ICD-10-CM | POA: Diagnosis not present

## 2018-06-06 NOTE — Assessment & Plan Note (Signed)
The 10-year ASCVD risk score Mikey Bussing DC Brooke Bonito., et al., 2013) is: 14.4%   Values used to calculate the score:     Age: 67 years     Sex: Female     Is Non-Hispanic African American: No     Diabetic: No     Tobacco smoker: No     Systolic Blood Pressure: 373 mmHg     Is BP treated: Yes     HDL Cholesterol: 45 mg/dL     Total Cholesterol: 183 mg/dL  LDL-125 Will re-check lipids in 6 months

## 2018-06-06 NOTE — Assessment & Plan Note (Addendum)
BP 132/30 Continue Losartan/HCTZ 50/12.5mg  QD Continue regular exercise

## 2018-06-06 NOTE — Progress Notes (Signed)
Subjective:    Patient ID: Cindy Figueroa, female    DOB: 1950/08/31, 67 y.o.   MRN: [id number]  HPI:  Cindy Figueroa is here for CPE She reports medication compliance, denies SE She has been riding stationary bike daily 40mins/day and hopes to increase to 68mins/day She has been following heart healthy diet and continues to abstain from tobacco/vape/ETOH use  Healthcare Maintenance: PAP-N/A Mammogram-ordered Immunizations-UTD DEXA-declined  Patient Care Team    Relationship Specialty Notifications Start End  Mina Marble D, NP PCP - General Family Medicine  12/26/16   Allyn Kenner, MD  Dermatology  12/26/16   Starling Manns, MD  Orthopedic Surgery  12/26/16     Patient Active Problem List   Diagnosis Date Noted  . Healthcare maintenance 06/06/2018  . Screening for breast cancer 06/06/2018  . Encounter for Medicare annual wellness exam 12/06/2017  . Obesity with body mass index of 30.0-39.9 03/13/2017  . Hyperlipidemia 03/13/2017  . Vitamin D deficiency 01/11/2017  . HTN (hypertension) 12/26/2016  . Poor balance 12/26/2016  . H/O fatigue 12/26/2016  . Family history of high cholesterol 12/26/2016     Past Medical History:  Diagnosis Date  . Hypertension      History reviewed. No pertinent surgical history.   Family History  Problem Relation Age of Onset  . Hyperlipidemia Mother   . Hypertension Mother   . Cancer Father   . Emphysema Father      Social History   Substance and Sexual Activity  Drug Use No     Social History   Substance and Sexual Activity  Alcohol Use No     Social History   Tobacco Use  Smoking Status Never Smoker  Smokeless Tobacco Never Used     Outpatient Encounter Medications as of 06/06/2018  Medication Sig  . losartan-hydrochlorothiazide (HYZAAR) 50-12.5 MG tablet Take 1 tablet by mouth daily.  . [DISCONTINUED] Iron-Vitamins (GERITOL PO) Take 1 tablet by mouth daily.  . [DISCONTINUED] OVER THE COUNTER MEDICATION Take 2  capsules by mouth daily. Tricardia  . [DISCONTINUED] phentermine (ADIPEX-P) 37.5 MG tablet Take 1 tablet (37.5 mg total) by mouth daily before breakfast.   No facility-administered encounter medications on file as of 06/06/2018.     Allergies: Penicillins and Plavix [clopidogrel bisulfate]  Body mass index is 32.82 kg/m.  Blood pressure 132/80, pulse 78, height 5\' 5"  (1.651 m), weight 197 lb 3.2 oz (89.4 kg), SpO2 98 %.  Review of Systems  Constitutional: Positive for fatigue. Negative for activity change, appetite change, chills, diaphoresis, fever and unexpected weight change.  HENT: Negative for congestion.   Eyes: Negative for visual disturbance.  Respiratory: Negative for cough, chest tightness, shortness of breath, wheezing and stridor.   Cardiovascular: Negative for chest pain, palpitations and leg swelling.  Gastrointestinal: Negative for abdominal distention, abdominal pain, blood in stool, constipation, diarrhea, nausea and vomiting.  Endocrine: Negative for cold intolerance, heat intolerance, polydipsia, polyphagia and polyuria.  Genitourinary: Negative for difficulty urinating and dysuria.  Musculoskeletal: Negative for arthralgias, back pain, gait problem, joint swelling, myalgias, neck pain and neck stiffness.  Skin: Negative for color change, pallor, rash and wound.  Neurological: Negative for dizziness and headaches.  Hematological: Does not bruise/bleed easily.  Psychiatric/Behavioral: Negative for agitation, behavioral problems, confusion, decreased concentration, dysphoric mood, hallucinations, self-injury, sleep disturbance and suicidal ideas. The patient is not nervous/anxious and is not hyperactive.        Objective:   Physical Exam  Constitutional: She is oriented to person,  place, and time. She appears well-developed and well-nourished. No distress.  HENT:  Head: Normocephalic and atraumatic.  Right Ear: External ear normal. Tympanic membrane is not  erythematous and not bulging. No decreased hearing is noted.  Left Ear: External ear normal. Tympanic membrane is not erythematous and not bulging. No decreased hearing is noted.  Nose: Nose normal. No mucosal edema or rhinorrhea. Right sinus exhibits no maxillary sinus tenderness and no frontal sinus tenderness. Left sinus exhibits no maxillary sinus tenderness and no frontal sinus tenderness.  Mouth/Throat: Uvula is midline, oropharynx is clear and moist and mucous membranes are normal. Tonsils are 0 on the right. Tonsils are 0 on the left. No tonsillar exudate.  Eyes: Pupils are equal, round, and reactive to light. Conjunctivae and EOM are normal.  Neck: Normal range of motion. Neck supple.  Cardiovascular: Normal rate, regular rhythm, normal heart sounds and intact distal pulses.  No murmur heard. Pulmonary/Chest: Effort normal and breath sounds normal. No stridor. No respiratory distress. She has no decreased breath sounds. She has no wheezes. She has no rhonchi. She has no rales. She exhibits no tenderness. Right breast exhibits no inverted nipple, no mass, no nipple discharge, no skin change and no tenderness. Left breast exhibits no inverted nipple, no mass, no nipple discharge, no skin change and no tenderness.  Abdominal: Soft. Bowel sounds are normal. She exhibits no distension and no mass. There is no tenderness. There is no rebound and no guarding. No hernia.  Musculoskeletal: Normal range of motion. She exhibits tenderness.       Right hip: She exhibits tenderness.  Lymphadenopathy:    She has no cervical adenopathy.  Neurological: She is alert and oriented to person, place, and time. Coordination normal.  Skin: Skin is warm and dry. Capillary refill takes less than 2 seconds. No rash noted. She is not diaphoretic. No erythema. No pallor.  Psychiatric: She has a normal mood and affect. Her behavior is normal. Judgment and thought content normal.  Nursing note and vitals  reviewed.         Assessment & Plan:   1. Screening for breast cancer   2. Hypertension, unspecified type   3. Hyperlipidemia, unspecified hyperlipidemia type   4. Healthcare maintenance   5. Postmenopausal   6. Estrogen deficiency     HTN (hypertension) BP 132/30 Continue Losartan/HCTZ 50/12.5mg  QD Continue regular exercise  Healthcare maintenance Continue to drink plenty of water and follow Mediterranean diet. Continue to ride your bike daily and increase mileage as tolerated. Continue Losartan/HCTZ 50/12.5mg  QD Mammogram and DEXA ordered. Recommend follow-up 6 months, office visit with fasting labs.  Hyperlipidemia The 10-year ASCVD risk score Mikey Bussing DC Jr., et al., 2013) is: 14.4%   Values used to calculate the score:     Age: 82 years     Sex: Female     Is Non-Hispanic African American: No     Diabetic: No     Tobacco smoker: No     Systolic Blood Pressure: 803 mmHg     Is BP treated: Yes     HDL Cholesterol: 45 mg/dL     Total Cholesterol: 183 mg/dL  LDL-125 Will re-check lipids in 6 months  DEXA-declined Mammogram-ordered   FOLLOW-UP:  Return in about 6 months (around 12/06/2018) for Regular Follow Up, Fasting Labs.

## 2018-06-06 NOTE — Patient Instructions (Addendum)
Preventive Care for Adults, Female  A healthy lifestyle and preventive care can promote health and wellness. Preventive health guidelines for women include the following key practices.   A routine yearly physical is a good way to check with your health care provider about your health and preventive screening. It is a chance to share any concerns and updates on your health and to receive a thorough exam.   Visit your dentist for a routine exam and preventive care every 6 months. Brush your teeth twice a day and floss once a day. Good oral hygiene prevents tooth decay and gum disease.   The frequency of eye exams is based on your age, health, family medical history, use of contact lenses, and other factors. Follow your health care provider's recommendations for frequency of eye exams.   Eat a healthy diet. Foods like vegetables, fruits, whole grains, low-fat dairy products, and lean protein foods contain the nutrients you need without too many calories. Decrease your intake of foods high in solid fats, added sugars, and salt. Eat the right amount of calories for you.Get information about a proper diet from your health care provider, if necessary.   Regular physical exercise is one of the most important things you can do for your health. Most adults should get at least 150 minutes of moderate-intensity exercise (any activity that increases your heart rate and causes you to sweat) each week. In addition, most adults need muscle-strengthening exercises on 2 or more days a week.   Maintain a healthy weight. The body mass index (BMI) is a screening tool to identify possible weight problems. It provides an estimate of body fat based on height and weight. Your health care provider can find your BMI, and can help you achieve or maintain a healthy weight.For adults 20 years and older:   - A BMI below 18.5 is considered underweight.   - A BMI of 18.5 to 24.9 is normal.   - A BMI of 25 to 29.9 is  considered overweight.   - A BMI of 30 and above is considered obese.   Maintain normal blood lipids and cholesterol levels by exercising and minimizing your intake of trans and saturated fats.  Eat a balanced diet with plenty of fruit and vegetables. Blood tests for lipids and cholesterol should begin at age 20 and be repeated every 5 years minimum.  If your lipid or cholesterol levels are high, you are over 40, or you are at high risk for heart disease, you may need your cholesterol levels checked more frequently.Ongoing high lipid and cholesterol levels should be treated with medicines if diet and exercise are not working.   If you smoke, find out from your health care provider how to quit. If you do not use tobacco, do not start.   Lung cancer screening is recommended for adults aged 55-80 years who are at high risk for developing lung cancer because of a history of smoking. A yearly low-dose CT scan of the lungs is recommended for people who have at least a 30-pack-year history of smoking and are a current smoker or have quit within the past 15 years. A pack year of smoking is smoking an average of 1 pack of cigarettes a day for 1 year (for example: 1 pack a day for 30 years or 2 packs a day for 15 years). Yearly screening should continue until the smoker has stopped smoking for at least 15 years. Yearly screening should be stopped for people who develop a   health problem that would prevent them from having lung cancer treatment.   If you are pregnant, do not drink alcohol. If you are breastfeeding, be very cautious about drinking alcohol. If you are not pregnant and choose to drink alcohol, do not have more than 1 drink per day. One drink is considered to be 12 ounces (355 mL) of beer, 5 ounces (148 mL) of wine, or 1.5 ounces (44 mL) of liquor.   Avoid use of street drugs. Do not share needles with anyone. Ask for help if you need support or instructions about stopping the use of  drugs.   High blood pressure causes heart disease and increases the risk of stroke. Your blood pressure should be checked at least yearly.  Ongoing high blood pressure should be treated with medicines if weight loss and exercise do not work.   If you are 69-55 years old, ask your health care provider if you should take aspirin to prevent strokes.   Diabetes screening involves taking a blood sample to check your fasting blood sugar level. This should be done once every 3 years, after age 38, if you are within normal weight and without risk factors for diabetes. Testing should be considered at a younger age or be carried out more frequently if you are overweight and have at least 1 risk factor for diabetes.   Breast cancer screening is essential preventive care for women. You should practice "breast self-awareness."  This means understanding the normal appearance and feel of your breasts and may include breast self-examination.  Any changes detected, no matter how small, should be reported to a health care provider.  Women in their 80s and 30s should have a clinical breast exam (CBE) by a health care provider as part of a regular health exam every 1 to 3 years.  After age 66, women should have a CBE every year.  Starting at age 1, women should consider having a mammogram (breast X-ray test) every year.  Women who have a family history of breast cancer should talk to their health care provider about genetic screening.  Women at a high risk of breast cancer should talk to their health care providers about having an MRI and a mammogram every year.   -Breast cancer gene (BRCA)-related cancer risk assessment is recommended for women who have family members with BRCA-related cancers. BRCA-related cancers include breast, ovarian, tubal, and peritoneal cancers. Having family members with these cancers may be associated with an increased risk for harmful changes (mutations) in the breast cancer genes BRCA1 and  BRCA2. Results of the assessment will determine the need for genetic counseling and BRCA1 and BRCA2 testing.   The Pap test is a screening test for cervical cancer. A Pap test can show cell changes on the cervix that might become cervical cancer if left untreated. A Pap test is a procedure in which cells are obtained and examined from the lower end of the uterus (cervix).   - Women should have a Pap test starting at age 57.   - Between ages 90 and 70, Pap tests should be repeated every 2 years.   - Beginning at age 63, you should have a Pap test every 3 years as long as the past 3 Pap tests have been normal.   - Some women have medical problems that increase the chance of getting cervical cancer. Talk to your health care provider about these problems. It is especially important to talk to your health care provider if a  new problem develops soon after your last Pap test. In these cases, your health care provider may recommend more frequent screening and Pap tests.   - The above recommendations are the same for women who have or have not gotten the vaccine for human papillomavirus (HPV).   - If you had a hysterectomy for a problem that was not cancer or a condition that could lead to cancer, then you no longer need Pap tests. Even if you no longer need a Pap test, a regular exam is a good idea to make sure no other problems are starting.   - If you are between ages 99 and 102 years, and you have had normal Pap tests going back 10 years, you no longer need Pap tests. Even if you no longer need a Pap test, a regular exam is a good idea to make sure no other problems are starting.   - If you have had past treatment for cervical cancer or a condition that could lead to cancer, you need Pap tests and screening for cancer for at least 20 years after your treatment.   - If Pap tests have been discontinued, risk factors (such as a new sexual partner) need to be reassessed to determine if screening should  be resumed.   - The HPV test is an additional test that may be used for cervical cancer screening. The HPV test looks for the virus that can cause the cell changes on the cervix. The cells collected during the Pap test can be tested for HPV. The HPV test could be used to screen women aged 18 years and older, and should be used in women of any age who have unclear Pap test results. After the age of 46, women should have HPV testing at the same frequency as a Pap test.   Colorectal cancer can be detected and often prevented. Most routine colorectal cancer screening begins at the age of 32 years and continues through age 39 years. However, your health care provider may recommend screening at an earlier age if you have risk factors for colon cancer. On a yearly basis, your health care provider may provide home test kits to check for hidden blood in the stool.  Use of a small camera at the end of a tube, to directly examine the colon (sigmoidoscopy or colonoscopy), can detect the earliest forms of colorectal cancer. Talk to your health care provider about this at age 16, when routine screening begins. Direct exam of the colon should be repeated every 5 -10 years through age 50 years, unless early forms of pre-cancerous polyps or small growths are found.   People who are at an increased risk for hepatitis B should be screened for this virus. You are considered at high risk for hepatitis B if:  -You were born in a country where hepatitis B occurs often. Talk with your health care provider about which countries are considered high risk.  - Your parents were born in a high-risk country and you have not received a shot to protect against hepatitis B (hepatitis B vaccine).  - You have HIV or AIDS.  - You use needles to inject street drugs.  - You live with, or have sex with, someone who has Hepatitis B.  - You get hemodialysis treatment.  - You take certain medicines for conditions like cancer, organ  transplantation, and autoimmune conditions.   Hepatitis C blood testing is recommended for all people born from [birth year] through 1966 and any individual  with known risks for hepatitis C.   Practice safe sex. Use condoms and avoid high-risk sexual practices to reduce the spread of sexually transmitted infections (STIs). STIs include gonorrhea, chlamydia, syphilis, trichomonas, herpes, HPV, and human immunodeficiency virus (HIV). Herpes, HIV, and HPV are viral illnesses that have no cure. They can result in disability, cancer, and death. Sexually active women aged 25 years and younger should be checked for chlamydia. Older women with new or multiple partners should also be tested for chlamydia. Testing for other STIs is recommended if you are sexually active and at increased risk.   Osteoporosis is a disease in which the bones lose minerals and strength with aging. This can result in serious bone fractures or breaks. The risk of osteoporosis can be identified using a bone density scan. Women ages 65 years and over and women at risk for fractures or osteoporosis should discuss screening with their health care providers. Ask your health care provider whether you should take a calcium supplement or vitamin D to There are also several preventive steps women can take to avoid osteoporosis and resulting fractures or to keep osteoporosis from worsening. -->Recommendations include:  Eat a balanced diet high in fruits, vegetables, calcium, and vitamins.  Get enough calcium. The recommended total intake of is 1,200 mg daily; for best absorption, if taking supplements, divide doses into 250-500 mg doses throughout the day. Of the two types of calcium, calcium carbonate is best absorbed when taken with food but calcium citrate can be taken on an empty stomach.  Get enough vitamin D. NAMS and the National Osteoporosis Foundation recommend at least 1,000 IU per day for women age 50 and over who are at risk of vitamin D  deficiency. Vitamin D deficiency can be caused by inadequate sun exposure (for example, those who live in northern latitudes).  Avoid alcohol and smoking. Heavy alcohol intake (more than 7 drinks per week) increases the risk of falls and hip fracture and women smokers tend to lose bone more rapidly and have lower bone mass than nonsmokers. Stopping smoking is one of the most important changes women can make to improve their health and decrease risk for disease.  Be physically active every day. Weight-bearing exercise (for example, fast walking, hiking, jogging, and weight training) may strengthen bones or slow the rate of bone loss that comes with aging. Balancing and muscle-strengthening exercises can reduce the risk of falling and fracture.  Consider therapeutic medications. Currently, several types of effective drugs are available. Healthcare providers can recommend the type most appropriate for each woman.  Eliminate environmental factors that may contribute to accidents. Falls cause nearly 90% of all osteoporotic fractures, so reducing this risk is an important bone-health strategy. Measures include ample lighting, removing obstructions to walking, using nonskid rugs on floors, and placing mats and/or grab bars in showers.  Be aware of medication side effects. Some common medicines make bones weaker. These include a type of steroid drug called glucocorticoids used for arthritis and asthma, some antiseizure drugs, certain sleeping pills, treatments for endometriosis, and some cancer drugs. An overactive thyroid gland or using too much thyroid hormone for an underactive thyroid can also be a problem. If you are taking these medicines, talk to your doctor about what you can do to help protect your bones.reduce the rate of osteoporosis.    Menopause can be associated with physical symptoms and risks. Hormone replacement therapy is available to decrease symptoms and risks. You should talk to your  health care provider   about whether hormone replacement therapy is right for you.   Use sunscreen. Apply sunscreen liberally and repeatedly throughout the day. You should seek shade when your shadow is shorter than you. Protect yourself by wearing long sleeves, pants, a wide-brimmed hat, and sunglasses year round, whenever you are outdoors.   Once a month, do a whole body skin exam, using a mirror to look at the skin on your back. Tell your health care provider of new moles, moles that have irregular borders, moles that are larger than a pencil eraser, or moles that have changed in shape or color.   -Stay current with required vaccines (immunizations).   Influenza vaccine. All adults should be immunized every year.  Tetanus, diphtheria, and acellular pertussis (Td, Tdap) vaccine. Pregnant women should receive 1 dose of Tdap vaccine during each pregnancy. The dose should be obtained regardless of the length of time since the last dose. Immunization is preferred during the 27th 36th week of gestation. An adult who has not previously received Tdap or who does not know her vaccine status should receive 1 dose of Tdap. This initial dose should be followed by tetanus and diphtheria toxoids (Td) booster doses every 10 years. Adults with an unknown or incomplete history of completing a 3-dose immunization series with Td-containing vaccines should begin or complete a primary immunization series including a Tdap dose. Adults should receive a Td booster every 10 years.  Varicella vaccine. An adult without evidence of immunity to varicella should receive 2 doses or a second dose if she has previously received 1 dose. Pregnant females who do not have evidence of immunity should receive the first dose after pregnancy. This first dose should be obtained before leaving the health care facility. The second dose should be obtained 4 8 weeks after the first dose.  Human papillomavirus (HPV) vaccine. Females aged 66 26  years who have not received the vaccine previously should obtain the 3-dose series. The vaccine is not recommended for use in pregnant females. However, pregnancy testing is not needed before receiving a dose. If a female is found to be pregnant after receiving a dose, no treatment is needed. In that case, the remaining doses should be delayed until after the pregnancy. Immunization is recommended for any person with an immunocompromised condition through the age of 19 years if she did not get any or all doses earlier. During the 3-dose series, the second dose should be obtained 4 8 weeks after the first dose. The third dose should be obtained 24 weeks after the first dose and 16 weeks after the second dose.  Zoster vaccine. One dose is recommended for adults aged 24 years or older unless certain conditions are present.  Measles, mumps, and rubella (MMR) vaccine. Adults born before [birth year] generally are considered immune to measles and mumps. Adults born in [birth year] or later should have 1 or more doses of MMR vaccine unless there is a contraindication to the vaccine or there is laboratory evidence of immunity to each of the three diseases. A routine second dose of MMR vaccine should be obtained at least 28 days after the first dose for students attending postsecondary schools, health care workers, or international travelers. People who received inactivated measles vaccine or an unknown type of measles vaccine during 1963 1967 should receive 2 doses of MMR vaccine. People who received inactivated mumps vaccine or an unknown type of mumps vaccine before 1979 and are at high risk for mumps infection should consider immunization with 2 doses of  MMR vaccine. For females of childbearing age, rubella immunity should be determined. If there is no evidence of immunity, females who are not pregnant should be vaccinated. If there is no evidence of immunity, females who are pregnant should delay immunization until after pregnancy.  Unvaccinated health care workers born before [birth year] who lack laboratory evidence of measles, mumps, or rubella immunity or laboratory confirmation of disease should consider measles and mumps immunization with 2 doses of MMR vaccine or rubella immunization with 1 dose of MMR vaccine.  Pneumococcal 13-valent conjugate (PCV13) vaccine. When indicated, a person who is uncertain of her immunization history and has no record of immunization should receive the PCV13 vaccine. An adult aged 53 years or older who has certain medical conditions and has not been previously immunized should receive 1 dose of PCV13 vaccine. This PCV13 should be followed with a dose of pneumococcal polysaccharide (PPSV23) vaccine. The PPSV23 vaccine dose should be obtained at least 8 weeks after the dose of PCV13 vaccine. An adult aged 56 years or older who has certain medical conditions and previously received 1 or more doses of PPSV23 vaccine should receive 1 dose of PCV13. The PCV13 vaccine dose should be obtained 1 or more years after the last PPSV23 vaccine dose.  Pneumococcal polysaccharide (PPSV23) vaccine. When PCV13 is also indicated, PCV13 should be obtained first. All adults aged 30 years and older should be immunized. An adult younger than age 9 years who has certain medical conditions should be immunized. Any person who resides in a nursing home or long-term care facility should be immunized. An adult smoker should be immunized. People with an immunocompromised condition and certain other conditions should receive both PCV13 and PPSV23 vaccines. People with human immunodeficiency virus (HIV) infection should be immunized as soon as possible after diagnosis. Immunization during chemotherapy or radiation therapy should be avoided. Routine use of PPSV23 vaccine is not recommended for American Indians, [place] Natives, or people younger than 65 years unless there are medical conditions that require PPSV23 vaccine. When indicated,  people who have unknown immunization and have no record of immunization should receive PPSV23 vaccine. One-time revaccination 5 years after the first dose of PPSV23 is recommended for people aged 49 64 years who have chronic kidney failure, nephrotic syndrome, asplenia, or immunocompromised conditions. People who received 1 2 doses of PPSV23 before age 22 years should receive another dose of PPSV23 vaccine at age 106 years or later if at least 5 years have passed since the previous dose. Doses of PPSV23 are not needed for people immunized with PPSV23 at or after age 35 years.  Meningococcal vaccine. Adults with asplenia or persistent complement component deficiencies should receive 2 doses of quadrivalent meningococcal conjugate (MenACWY-D) vaccine. The doses should be obtained at least 2 months apart. Microbiologists working with certain meningococcal bacteria, [place] recruits, people at risk during an outbreak, and people who travel to or live in countries with a high rate of meningitis should be immunized. A first-year college student up through age 6 years who is living in a residence hall should receive a dose if she did not receive a dose on or after her 16th birthday. Adults who have certain high-risk conditions should receive one or more doses of vaccine.  Hepatitis A vaccine. Adults who wish to be protected from this disease, have certain high-risk conditions, work with hepatitis A-infected animals, work in hepatitis A research labs, or travel to or work in countries with a high rate of hepatitis A should be  immunized. Adults who were previously unvaccinated and who anticipate close contact with an international adoptee during the first 60 days after arrival in the [place] from a country with a high rate of hepatitis A should be immunized.  Hepatitis B vaccine.  Adults who wish to be protected from this disease, have certain high-risk conditions, may be exposed to blood or other infectious  body fluids, are household contacts or sex partners of hepatitis B positive people, are clients or workers in certain care facilities, or travel to or work in countries with a high rate of hepatitis B should be immunized.  Haemophilus influenzae type b (Hib) vaccine. A previously unvaccinated person with asplenia or sickle cell disease or having a scheduled splenectomy should receive 1 dose of Hib vaccine. Regardless of previous immunization, a recipient of a hematopoietic stem cell transplant should receive a 3-dose series 6 12 months after her successful transplant. Hib vaccine is not recommended for adults with HIV infection.  Preventive Services / Frequency Ages 6 to 39years  Blood pressure check.** / Every 1 to 2 years.  Lipid and cholesterol check.** / Every 5 years beginning at age 39.  Clinical breast exam.** / Every 3 years for women in their 61s and 62s.  BRCA-related cancer risk assessment.** / For women who have family members with a BRCA-related cancer (breast, ovarian, tubal, or peritoneal cancers).  Pap test.** / Every 2 years from ages 47 through 85. Every 3 years starting at age 34 through age 12 or 74 with a history of 3 consecutive normal Pap tests.  HPV screening.** / Every 3 years from ages 46 through ages 43 to 54 with a history of 3 consecutive normal Pap tests.  Hepatitis C blood test.** / For any individual with known risks for hepatitis C.  Skin self-exam. / Monthly.  Influenza vaccine. / Every year.  Tetanus, diphtheria, and acellular pertussis (Tdap, Td) vaccine.** / Consult your health care provider. Pregnant women should receive 1 dose of Tdap vaccine during each pregnancy. 1 dose of Td every 10 years.  Varicella vaccine.** / Consult your health care provider. Pregnant females who do not have evidence of immunity should receive the first dose after pregnancy.  HPV vaccine. / 3 doses over 6 months, if 64 and younger. The vaccine is not recommended for use in  pregnant females. However, pregnancy testing is not needed before receiving a dose.  Measles, mumps, rubella (MMR) vaccine.** / You need at least 1 dose of MMR if you were born in 1957 or later. You may also need a 2nd dose. For females of childbearing age, rubella immunity should be determined. If there is no evidence of immunity, females who are not pregnant should be vaccinated. If there is no evidence of immunity, females who are pregnant should delay immunization until after pregnancy.  Pneumococcal 13-valent conjugate (PCV13) vaccine.** / Consult your health care provider.  Pneumococcal polysaccharide (PPSV23) vaccine.** / 1 to 2 doses if you smoke cigarettes or if you have certain conditions.  Meningococcal vaccine.** / 1 dose if you are age 71 to 37 years and a Market researcher living in a residence hall, or have one of several medical conditions, you need to get vaccinated against meningococcal disease. You may also need additional booster doses.  Hepatitis A vaccine.** / Consult your health care provider.  Hepatitis B vaccine.** / Consult your health care provider.  Haemophilus influenzae type b (Hib) vaccine.** / Consult your health care provider.  Ages 55 to 64years  Blood pressure check.** / Every 1 to 2 years.  Lipid and cholesterol check.** / Every 5 years beginning at age 56 years.  Lung cancer screening. / Every year if you are aged 28 80 years and have a 30-pack-year history of smoking and currently smoke or have quit within the past 15 years. Yearly screening is stopped once you have quit smoking for at least 15 years or develop a health problem that would prevent you from having lung cancer treatment.  Clinical breast exam.** / Every year after age 24 years.  BRCA-related cancer risk assessment.** / For women who have family members with a BRCA-related cancer (breast, ovarian, tubal, or peritoneal cancers).  Mammogram.** / Every year beginning at age 28  years and continuing for as long as you are in good health. Consult with your health care provider.  Pap test.** / Every 3 years starting at age 38 years through age 13 or 56 years with a history of 3 consecutive normal Pap tests.  HPV screening.** / Every 3 years from ages 62 years through ages 52 to 30 years with a history of 3 consecutive normal Pap tests.  Fecal occult blood test (FOBT) of stool. / Every year beginning at age 80 years and continuing until age 29 years. You may not need to do this test if you get a colonoscopy every 10 years.  Flexible sigmoidoscopy or colonoscopy.** / Every 5 years for a flexible sigmoidoscopy or every 10 years for a colonoscopy beginning at age 20 years and continuing until age 12 years.  Hepatitis C blood test.** / For all people born from [birth year] through 1966 and any individual with known risks for hepatitis C.  Skin self-exam. / Monthly.  Influenza vaccine. / Every year.  Tetanus, diphtheria, and acellular pertussis (Tdap/Td) vaccine.** / Consult your health care provider. Pregnant women should receive 1 dose of Tdap vaccine during each pregnancy. 1 dose of Td every 10 years.  Varicella vaccine.** / Consult your health care provider. Pregnant females who do not have evidence of immunity should receive the first dose after pregnancy.  Zoster vaccine.** / 1 dose for adults aged 54 years or older.  Measles, mumps, rubella (MMR) vaccine.** / You need at least 1 dose of MMR if you were born in 1957 or later. You may also need a 2nd dose. For females of childbearing age, rubella immunity should be determined. If there is no evidence of immunity, females who are not pregnant should be vaccinated. If there is no evidence of immunity, females who are pregnant should delay immunization until after pregnancy.  Pneumococcal 13-valent conjugate (PCV13) vaccine.** / Consult your health care provider.  Pneumococcal polysaccharide (PPSV23) vaccine.** / 1 to 2 doses if  you smoke cigarettes or if you have certain conditions.  Meningococcal vaccine.** / Consult your health care provider.  Hepatitis A vaccine.** / Consult your health care provider.  Hepatitis B vaccine.** / Consult your health care provider.  Haemophilus influenzae type b (Hib) vaccine.** / Consult your health care provider.  Ages 80 years and over  Blood pressure check.** / Every 1 to 2 years.  Lipid and cholesterol check.** / Every 5 years beginning at age 47 years.  Lung cancer screening. / Every year if you are aged 25 80 years and have a 30-pack-year history of smoking and currently smoke or have quit within the past 15 years. Yearly screening is stopped once you have quit smoking for at least 15 years or develop a health problem that  would prevent you from having lung cancer treatment.  Clinical breast exam.** / Every year after age 103 years.  BRCA-related cancer risk assessment.** / For women who have family members with a BRCA-related cancer (breast, ovarian, tubal, or peritoneal cancers).  Mammogram.** / Every year beginning at age 36 years and continuing for as long as you are in good health. Consult with your health care provider.  Pap test.** / Every 3 years starting at age 5 years through age 85 or 10 years with 3 consecutive normal Pap tests. Testing can be stopped between 65 and 70 years with 3 consecutive normal Pap tests and no abnormal Pap or HPV tests in the past 10 years.  HPV screening.** / Every 3 years from ages 93 years through ages 70 or 45 years with a history of 3 consecutive normal Pap tests. Testing can be stopped between 65 and 70 years with 3 consecutive normal Pap tests and no abnormal Pap or HPV tests in the past 10 years.  Fecal occult blood test (FOBT) of stool. / Every year beginning at age 8 years and continuing until age 45 years. You may not need to do this test if you get a colonoscopy every 10 years.  Flexible sigmoidoscopy or colonoscopy.** /  Every 5 years for a flexible sigmoidoscopy or every 10 years for a colonoscopy beginning at age 69 years and continuing until age 68 years.  Hepatitis C blood test.** / For all people born from [birth year] through 1966 and any individual with known risks for hepatitis C.  Osteoporosis screening.** / A one-time screening for women ages 7 years and over and women at risk for fractures or osteoporosis.  Skin self-exam. / Monthly.  Influenza vaccine. / Every year.  Tetanus, diphtheria, and acellular pertussis (Tdap/Td) vaccine.** / 1 dose of Td every 10 years.  Varicella vaccine.** / Consult your health care provider.  Zoster vaccine.** / 1 dose for adults aged 5 years or older.  Pneumococcal 13-valent conjugate (PCV13) vaccine.** / Consult your health care provider.  Pneumococcal polysaccharide (PPSV23) vaccine.** / 1 dose for all adults aged 74 years and older.  Meningococcal vaccine.** / Consult your health care provider.  Hepatitis A vaccine.** / Consult your health care provider.  Hepatitis B vaccine.** / Consult your health care provider.  Haemophilus influenzae type b (Hib) vaccine.** / Consult your health care provider. ** Family history and personal history of risk and conditions may change your health care provider's recommendations. Document Released: 09/26/2001 Document Revised: 05/21/2013  [facility] Patient Information 2014 [place], [place].   EXERCISE AND DIET:  We recommended that you start or continue a regular exercise program for good health. Regular exercise means any activity that makes your heart beat faster and makes you sweat.  We recommend exercising at least 30 minutes per day at least 3 days a week, preferably 5.  We also recommend a diet low in fat and sugar / carbohydrates.  Inactivity, poor dietary choices and obesity can cause diabetes, heart attack, stroke, and kidney damage, among others.     ALCOHOL AND SMOKING:  Women should limit their alcohol intake to no  more than 7 drinks/beers/glasses of wine (combined, not each!) per week. Moderation of alcohol intake to this level decreases your risk of breast cancer and liver damage.  ( And of course, no recreational drugs are part of a healthy lifestyle.)  Also, you should not be smoking at all or even being exposed to second hand smoke. Most people know smoking can  cause cancer, and various heart and lung diseases, but did you know it also contributes to weakening of your bones?  Aging of your skin?  Yellowing of your teeth and nails?   CALCIUM AND VITAMIN D:  Adequate intake of calcium and Vitamin D are recommended.  The recommendations for exact amounts of these supplements seem to change often, but generally speaking 600 mg of calcium (either carbonate or citrate) and 800 units of Vitamin D per day seems prudent. Certain women may benefit from higher intake of Vitamin D.  If you are among these women, your doctor will have told you during your visit.     PAP SMEARS:  Pap smears, to check for cervical cancer or precancers,  have traditionally been done yearly, although recent scientific advances have shown that most women can have pap smears less often.  However, every woman still should have a physical exam from her gynecologist or primary care physician every year. It will include a breast check, inspection of the vulva and vagina to check for abnormal growths or skin changes, a visual exam of the cervix, and then an exam to evaluate the size and shape of the uterus and ovaries.  And after 68 years of age, a rectal exam is indicated to check for rectal cancers. We will also provide age appropriate advice regarding health maintenance, like when you should have certain vaccines, screening for sexually transmitted diseases, bone density testing, colonoscopy, mammograms, etc.    MAMMOGRAMS:  All women over 65 years old should have a yearly mammogram. Many facilities now offer a "3D" mammogram, which may cost  around $50 extra out of pocket. If possible,  we recommend you accept the option to have the 3D mammogram performed.  It both reduces the number of women who will be called back for extra views which then turn out to be normal, and it is better than the routine mammogram at detecting truly abnormal areas.     COLONOSCOPY:  Colonoscopy to screen for colon cancer is recommended for all women at age 26.  We know, you hate the idea of the prep.  We agree, BUT, having colon cancer and not knowing it is worse!!  Colon cancer so often starts as a polyp that can be seen and removed at colonscopy, which can quite literally save your life!  And if your first colonoscopy is normal and you have no family history of colon cancer, most women don't have to have it again for 10 years.  Once every ten years, you can do something that may end up saving your life, right?  We will be happy to help you get it scheduled when you are ready.  Be sure to check your insurance coverage so you understand how much it will cost.  It may be covered as a preventative service at no cost, but you should check your particular policy.    Mediterranean Diet A Mediterranean diet refers to food and lifestyle choices that are based on the traditions of countries located on the [employer]. This way of eating has been shown to help prevent certain conditions and improve outcomes for people who have chronic diseases, like kidney disease and heart disease. What are tips for following this plan? Lifestyle  Cook and eat meals together with your family, when possible.  Drink enough fluid to keep your urine clear or pale yellow.  Be physically active every day. This includes: ? Aerobic exercise like running or swimming. ? Leisure activities like  gardening, walking, or housework.  Get 7-8 hours of sleep each night.  If recommended by your health care provider, drink red wine in moderation. This means 1 glass a day for nonpregnant women  and 2 glasses a day for men. A glass of wine equals 5 oz (150 mL). Reading food labels  Check the serving size of packaged foods. For foods such as rice and pasta, the serving size refers to the amount of cooked product, not dry.  Check the total fat in packaged foods. Avoid foods that have saturated fat or trans fats.  Check the ingredients list for added sugars, such as corn syrup. Shopping  At the grocery store, buy most of your food from the areas near the walls of the store. This includes: ? Fresh fruits and vegetables (produce). ? Grains, beans, nuts, and seeds. Some of these may be available in unpackaged forms or large amounts (in bulk). ? Fresh seafood. ? Poultry and eggs. ? Low-fat dairy products.  Buy whole ingredients instead of prepackaged foods.  Buy fresh fruits and vegetables in-season from local farmers markets.  Buy frozen fruits and vegetables in resealable bags.  If you do not have access to quality fresh seafood, buy precooked frozen shrimp or canned fish, such as tuna, salmon, or sardines.  Buy small amounts of raw or cooked vegetables, salads, or olives from the deli or salad bar at your store.  Stock your pantry so you always have certain foods on hand, such as olive oil, canned tuna, canned tomatoes, rice, pasta, and beans. Cooking  Cook foods with extra-virgin olive oil instead of using butter or other vegetable oils.  Have meat as a side dish, and have vegetables or grains as your main dish. This means having meat in small portions or adding small amounts of meat to foods like pasta or stew.  Use beans or vegetables instead of meat in common dishes like chili or lasagna.  Experiment with different cooking methods. Try roasting or broiling vegetables instead of steaming or sauteing them.  Add frozen vegetables to soups, stews, pasta, or rice.  Add nuts or seeds for added healthy fat at each meal. You can add these to yogurt, salads, or vegetable  dishes.  Marinate fish or vegetables using olive oil, lemon juice, garlic, and fresh herbs. Meal planning  Plan to eat 1 vegetarian meal one day each week. Try to work up to 2 vegetarian meals, if possible.  Eat seafood 2 or more times a week.  Have healthy snacks readily available, such as: ? Vegetable sticks with hummus. ? [place] yogurt. ? Fruit and nut trail mix.  Eat balanced meals throughout the week. This includes: ? Fruit: 2-3 servings a day ? Vegetables: 4-5 servings a day ? Low-fat dairy: 2 servings a day ? Fish, poultry, or lean meat: 1 serving a day ? Beans and legumes: 2 or more servings a week ? Nuts and seeds: 1-2 servings a day ? Whole grains: 6-8 servings a day ? Extra-virgin olive oil: 3-4 servings a day  Limit red meat and sweets to only a few servings a month What are my food choices?  Mediterranean diet ? Recommended ? Grains: Whole-grain pasta. Brown rice. Bulgar wheat. Polenta. Couscous. Whole-wheat bread. [place]. ? Vegetables: Artichokes. Beets. Broccoli. Cabbage. Carrots. Eggplant. Green beans. Chard. Kale. Spinach. Onions. Leeks. Peas. Squash. Tomatoes. Peppers. Radishes. ? Fruits: Apples. Apricots. Avocado. Berries. Bananas. Cherries. Dates. Figs. Grapes. Lemons. Melon. Oranges. Peaches. Plums. Pomegranate. ? Meats and other protein  foods: Beans. Almonds. Sunflower seeds. Pine nuts. Peanuts. [place]. Salmon. Scallops. Shrimp. [place]. Tilapia. Clams. Oysters. Eggs. ? Dairy: Low-fat milk. Cheese. Greek yogurt. ? Beverages: Water. Red wine. Herbal tea. ? Fats and oils: Extra virgin olive oil. Avocado oil. Grape seed oil. ? Sweets and desserts: [place] yogurt with honey. Baked apples. Poached pears. Trail mix. ? Seasoning and other foods: Basil. Cilantro. Coriander. Cumin. Mint. Parsley. Sage. Rosemary. Tarragon. Garlic. Oregano. Thyme. Pepper. Balsalmic vinegar. Tahini. Hummus. Tomato sauce. Olives. Mushrooms. ? Limit these ? Grains: Prepackaged pasta or  rice dishes. Prepackaged cereal with added sugar. ? Vegetables: Deep fried potatoes (french fries). ? Fruits: Fruit canned in syrup. ? Meats and other protein foods: Beef. Pork. Lamb. Poultry with skin. Hot dogs. [place] Salines. ? Dairy: Ice cream. Sour cream. Whole milk. ? Beverages: Juice. Sugar-sweetened soft drinks. Beer. Liquor and spirits. ? Fats and oils: Butter. Canola oil. Vegetable oil. Beef fat (tallow). Lard. ? Sweets and desserts: Cookies. Cakes. Pies. Candy. ? Seasoning and other foods: Mayonnaise. Premade sauces and marinades. ? The items listed may not be a complete list. Talk with your dietitian about what dietary choices are right for you. Summary  The Mediterranean diet includes both food and lifestyle choices.  Eat a variety of fresh fruits and vegetables, beans, nuts, seeds, and whole grains.  Limit the amount of red meat and sweets that you eat.  Talk with your health care provider about whether it is safe for you to drink red wine in moderation. This means 1 glass a day for nonpregnant women and 2 glasses a day for men. A glass of wine equals 5 oz (150 mL). This information is not intended to replace advice given to you by your health care provider. Make sure you discuss any questions you have with your health care provider. Document Released: 03/23/2016 Document Revised: 04/25/2016 Document Reviewed: 03/23/2016 Elsevier Interactive Patient Education  2018 [place] to drink plenty of water and follow Mediterranean diet. Continue to ride your bike daily and increase mileage as tolerated. Continue Losartan/HCTZ 50/12.70m QD Mammogram ordered. Recommend follow-up 6 months, office visit with fasting labs. NICE TO SEE YOU!

## 2018-06-06 NOTE — Assessment & Plan Note (Signed)
Continue to drink plenty of water and follow Mediterranean diet. Continue to ride your bike daily and increase mileage as tolerated. Continue Losartan/HCTZ 50/12.5mg  QD Mammogram and DEXA ordered. Recommend follow-up 6 months, office visit with fasting labs.

## 2018-07-23 DIAGNOSIS — M5416 Radiculopathy, lumbar region: Secondary | ICD-10-CM | POA: Diagnosis not present

## 2018-07-25 ENCOUNTER — Ambulatory Visit
Admission: RE | Admit: 2018-07-25 | Discharge: 2018-07-25 | Disposition: A | Discharge status: Home / Self Care | Payer: Medicare HMO | Source: Ambulatory Visit | Attending: Adult Health | Admitting: Adult Health

## 2018-07-25 DIAGNOSIS — Z1231 Encounter for screening mammogram for malignant neoplasm of breast: Secondary | ICD-10-CM | POA: Diagnosis not present

## 2018-07-25 DIAGNOSIS — Z1239 Encounter for other screening for malignant neoplasm of breast: Secondary | ICD-10-CM

## 2018-08-13 DIAGNOSIS — M5416 Radiculopathy, lumbar region: Secondary | ICD-10-CM | POA: Diagnosis not present

## 2018-08-13 DIAGNOSIS — M4726 Other spondylosis with radiculopathy, lumbar region: Secondary | ICD-10-CM | POA: Diagnosis not present

## 2018-09-09 DIAGNOSIS — M5416 Radiculopathy, lumbar region: Secondary | ICD-10-CM | POA: Diagnosis not present

## 2018-09-10 ENCOUNTER — Other Ambulatory Visit: Payer: Self-pay | Admitting: Adult Health

## 2018-10-18 DIAGNOSIS — D485 Neoplasm of uncertain behavior of skin: Secondary | ICD-10-CM | POA: Diagnosis not present

## 2018-10-18 DIAGNOSIS — L570 Actinic keratosis: Secondary | ICD-10-CM | POA: Diagnosis not present

## 2018-10-18 DIAGNOSIS — X32XXXD Exposure to sunlight, subsequent encounter: Secondary | ICD-10-CM | POA: Diagnosis not present

## 2018-10-21 ENCOUNTER — Ambulatory Visit (INDEPENDENT_AMBULATORY_CARE_PROVIDER_SITE_OTHER): Payer: Medicare HMO | Admitting: Adult Health

## 2018-10-21 ENCOUNTER — Encounter: Payer: Self-pay | Admitting: Adult Health

## 2018-10-21 VITALS — BP 150/79 | HR 95 | Temp 98.5°F | Ht 65.0 in | Wt 197.0 lb

## 2018-10-21 DIAGNOSIS — F430 Acute stress reaction: Secondary | ICD-10-CM

## 2018-10-21 DIAGNOSIS — I10 Essential (primary) hypertension: Secondary | ICD-10-CM

## 2018-10-21 DIAGNOSIS — F411 Generalized anxiety disorder: Secondary | ICD-10-CM | POA: Insufficient documentation

## 2018-10-21 DIAGNOSIS — F4310 Post-traumatic stress disorder, unspecified: Secondary | ICD-10-CM

## 2018-10-21 DIAGNOSIS — R69 Illness, unspecified: Secondary | ICD-10-CM | POA: Diagnosis not present

## 2018-10-21 MED ORDER — LORAZEPAM 0.5 MG PO TABS: ORAL_TABLET | ORAL | 1 refills | Status: DC

## 2018-10-21 NOTE — Assessment & Plan Note (Addendum)
[place] Controlled Substance Database reviewed- no aberrancies noted Please discuss events with your Doristine Bosworth and if your would like a referral to [facility], please call clinic. Limit stimulants, ie: caffeine.  Exercise often, remain well hydrated, and eat a heart healthy diet. To help reduce nighttime anxiety and promote sleep- use Lorazepam 0.5mg - 1/2 to 1 tablet nightly as needed. Follow-up as needed.
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
## 2018-10-21 NOTE — Patient Instructions (Signed)
Post-Traumatic Stress Disorder, Adult Post-traumatic stress disorder (PTSD) is a mental health disorder that can occur after a traumatic event, such as a threat to life, serious injury, or sexual violence. Some people who experience these types of events may develop PTSD. Sometimes, PTSD can occur in people who hear about trauma that occurs to a close family member or friend. PTSD can happen to anyone at any age. What increases the risk? This condition is more likely to occur in:  Engineer, manufacturing.  People who have been the victims of, or witness to, a traumatic event, such as: ? Domestic violence. ? Childhood physical or sexual abuse. ? Rape. ? Natural disasters. ? Accidents involving serious injury. What are the signs or symptoms? Symptoms of PTSD can be grouped into several categories: intrusive, avoidance, increased arousal, and negative moods and thoughts. Intrusive Symptoms This is when a person re-experiences the traumatic event through one or more of the following ways:  Distressing dreams.  Feelings of fear, horror, intense sadness, or anger in response to a reminder of the trauma.  Unwanted distressing memories while awake.  Physical reactions triggered by reminders of the trauma, such as increased heart rate, shortness of breath, sweating, and shaking.  Having flashbacks, or feelings like you are going through the event again. Avoidance Symptoms This is when a person avoids thoughts, conversations, people, or activities that are reminders of the trauma. Symptoms may also include:  Decreased interest or participation in daily activities.  Loss of connection or avoidance of other people. Increased Arousal Symptoms This is when a person is more sensitive or reacts more easily to their environment. Symptoms may include:  Being easily startled.  Careless or self-destructive behavior.  Irritability.  Feeling on edge.  Difficulty  concentrating.  Verbal or physical outbursts of anger toward other people or objects.  Difficulty sleeping. Negative Moods or Thoughts These may include:  Belief that oneself or others are bad.  Regular feelings of fear, horror, anger, sadness, guilt, or shame.  Not being able to remember certain parts of the traumatic event.  Blaming themselves or others for the trauma.  Inability to experience positive emotions, such as happiness or love. PTSD symptoms may start soon after a frightening event or months or years later. Symptoms last at least one month and tend to disrupt relationships, work, and daily activities. How is this diagnosed? PTSD is diagnosed through an assessment by a mental health professional. Cindy Figueroa will be asked questions about any traumatic events. You will also be asked about how these events have changed your thoughts, mood, behavior, and ability to function on a daily basis. You may also be asked if you use alcohol or drugs. How is this treated? Treatment for PTSD may include:  Medicines. Certain medicines can reduce some PTSD symptoms.  Counseling (cognitive behavioral therapy). Talk therapy with a mental health professional who is experienced in treating PTSD can help.  Eye movement desensitization and reprocessing therapy (EMDR). This type of therapy occurs with a specialized therapist. Many people with PTSD benefit from a combination of these treatments. If you have other mental health problems, such as depression, alcohol abuse, or drug addiction, your treatment plan will include treatment for these other conditions. Follow these instructions at home: Lifestyle   Find a support group in your community. Often groups are available for [employer] veterans, trauma victims, and family members or caregivers.  Find ways to relax. This may include: ? Breathing exercises. ? Meditation. ? Yoga. ? Listening to  quiet music.  Exercise regularly. Try to do at least 30  minutes of physical activity most days of the week.  Try to get 7-9 hours of sleep each night. To help with sleep: ? Keep your bedroom cool and dark. ? Do not eat a heavy meal within one hour of bedtime. ? Do not drink alcohol or caffeinated drinks before bed. ? Avoid screen time, such as television, computers, tablets, or cell phones before bed.  Do not drink alcohol or take illegal drugs.  Look into volunteer opportunities. This can help you feel more connected to your community.  Take steps to help yourself feel safer at home, such as by installing a security system.  Contact a local organization to find out if you are eligible for a service dog.  Keep daily contact with at least one trusted friend or family member.  If your PTSD is affecting your marriage or family, seek help from a family therapist. General instructions  Take over-the-counter and prescription medicines only as told by your health care provider.  Keep all follow-up visits as told by your health care provider and counselor. This is important.  Make sure to let all of your health care providers know you have PTSD. This is especially important if you are having surgery or need to be admitted to the hospital. Contact a health care provider if:  Your symptoms do not get better or get worse. Get help right away if:  You have thoughts of wanting to harm yourself or others. If you ever feel like you may hurt yourself or others, or may have thoughts about taking your own life, get help right away. You can go to your nearest emergency department or call:  Your local emergency services (911 in the [place]  A suicide crisis helpline, such as the [place] at 302-655-4187. This is open 24 hours a day. Summary  Post-traumatic stress disorder (PTSD) is a mental health disorder that can occur after a traumatic event. Some people who experience trauma, such as a threat to life, serious injury, or  sexual violence, may develop PTSD.  Treatment for PTSD may include medicines, counseling, eye movement desensitization and reprocessing therapy (EMDR), or a combination of therapies.  Find a support group in your community and other ways to help cope with stress. If your PTSD is affecting your marriage or family, seek help from a family therapist. This information is not intended to replace advice given to you by your health care provider. Make sure you discuss any questions you have with your health care provider. Document Released: 04/25/2001 Document Revised: 02/05/2017 Document Reviewed: 07/16/2015 Elsevier Interactive Patient Education  2019 [employer].  Please discuss events with your Doristine Bosworth and if your would like a referral to [employer], please call clinic. Limit stimulants, ie: caffeine.  Exercise often, remain well hydrated, and eat a heart healthy diet. To help reduce nighttime anxiety and promote sleep- use Lorazepam 0.5mg - 1/2 to 1 tablet nightly as needed. Follow-up as needed. FEEL BETTER!

## 2018-10-21 NOTE — Assessment & Plan Note (Addendum)
BP above goal-150/79, HR 95, likely r/t to acute emotional distress  Currently on Losartan/HCTZ 50/12.5mg  QD

## 2018-10-23 DIAGNOSIS — M5416 Radiculopathy, lumbar region: Secondary | ICD-10-CM | POA: Diagnosis not present

## 2018-10-23 DIAGNOSIS — M5431 Sciatica, right side: Secondary | ICD-10-CM | POA: Diagnosis not present

## 2018-11-19 ENCOUNTER — Ambulatory Visit (INDEPENDENT_AMBULATORY_CARE_PROVIDER_SITE_OTHER): Payer: Medicare HMO | Admitting: Adult Health

## 2018-11-19 ENCOUNTER — Other Ambulatory Visit: Payer: Self-pay

## 2018-11-19 ENCOUNTER — Encounter: Payer: Self-pay | Admitting: Adult Health

## 2018-11-19 VITALS — BP 131/76 | HR 66 | Temp 96.8°F | Ht 65.0 in | Wt 198.0 lb

## 2018-11-19 DIAGNOSIS — F430 Acute stress reaction: Secondary | ICD-10-CM

## 2018-11-19 DIAGNOSIS — F411 Generalized anxiety disorder: Secondary | ICD-10-CM

## 2018-11-19 DIAGNOSIS — R69 Illness, unspecified: Secondary | ICD-10-CM | POA: Diagnosis not present

## 2018-11-19 MED ORDER — LORAZEPAM 0.5 MG PO TABS: ORAL_TABLET | ORAL | 0 refills | Status: DC
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
## 2018-11-19 NOTE — Assessment & Plan Note (Signed)
Assessment and Plan: [facility] Controlled Substance Database reviewed- no aberrancies noted FINAL refill on Lorazepam 0.5mg  provided Avoid caffeine Increase regular exercise Call Doristine Bosworth for counseling Recommend Guided Imagery (Google), Meditation  Follow Up Instructions: PRN   I discussed the assessment and treatment plan with the patient. The patient was provided an opportunity to ask questions and all were answered. The patient agreed with the plan and demonstrated an understanding of the instructions.   The patient was advised to call back or seek an in-person evaluation if the symptoms worsen or if the condition fails to improve as anticipated.

## 2018-12-02 ENCOUNTER — Other Ambulatory Visit: Payer: Self-pay | Admitting: Adult Health

## 2018-12-10 ENCOUNTER — Ambulatory Visit: Payer: Medicare HMO | Admitting: Adult Health

## 2018-12-30 NOTE — Progress Notes (Addendum)
Subjective:    Patient ID: Cindy Figueroa, female    DOB: [birth year], 68 y.o.   MRN: [id number]  HPI:  [name] presents with RUE pain r/t to fall that occurred 5 days ago. She reports tripping over a step on front porch and fell onto level grass.  She denies striking her head of LOC. She reports breaking her fall on RUE. She was able to stand on her own. She denies previous injury to [place]. She reports constant, aching pain 7/10 of RUE. She reports difficulty raising RUE and extending R arm behind her back. She denies numbness/tingling in her R hand. She is R hand dominant. She reports pain is worse with use/movement. She reports pain decreases with rest, ice, and OTC Ibuprofen. She is not on anti-coagulants.  Patient Care Team    Relationship Specialty Notifications Start End  Mina Marble D, NP PCP - General Family Medicine  12/26/16   Allyn Kenner, MD  Dermatology  12/26/16   Starling Manns, MD  Orthopedic Surgery  12/26/16     Patient Active Problem List   Diagnosis Date Noted  . Pain in right upper arm 12/31/2018  . PTSD (post-traumatic stress disorder) 10/21/2018  . Anxiety in acute stress reaction 10/21/2018  . Healthcare maintenance 06/06/2018  . Screening for breast cancer 06/06/2018  . Encounter for Medicare annual wellness exam 12/06/2017  . Obesity with body mass index of 30.0-39.9 03/13/2017  . Hyperlipidemia 03/13/2017  . Vitamin D deficiency 01/11/2017  . HTN (hypertension) 12/26/2016  . Poor balance 12/26/2016  . H/O fatigue 12/26/2016  . Family history of high cholesterol 12/26/2016     Past Medical History:  Diagnosis Date  . Hypertension      History reviewed. No pertinent surgical history.   Family History  Problem Relation Age of Onset  . Hyperlipidemia Mother   . Hypertension Mother   . Cancer Father   . Emphysema Father      Social History   Substance and Sexual Activity  Drug Use No     Social History   Substance and Sexual  Activity  Alcohol Use No     Social History   Tobacco Use  Smoking Status Never Smoker  Smokeless Tobacco Never Used     Outpatient Encounter Medications as of 12/31/2018  Medication Sig  . losartan-hydrochlorothiazide (HYZAAR) 50-12.5 MG tablet TAKE 1 TABLET BY MOUTH EVERY DAY  . [DISCONTINUED] LORazepam (ATIVAN) 0.5 MG tablet 1/2 to 1 tablet at bedtime as needed for anxiety/insomnia- NO ADDITIONAL REFILLS   No facility-administered encounter medications on file as of 12/31/2018.     Allergies: Penicillins and Plavix [clopidogrel bisulfate]  Body mass index is 33.32 kg/m.  Blood pressure 139/83, pulse 76, temperature 97.8 F (36.6 C), temperature source Oral, height 5\' 5"  (1.651 m), weight 200 lb 3.2 oz (90.8 kg), SpO2 99 %.  Review of Systems  Constitutional: Positive for activity change and fatigue. Negative for appetite change, chills, diaphoresis, fever and unexpected weight change.  HENT: Negative for congestion.   Eyes: Negative for visual disturbance.  Respiratory: Negative for cough, chest tightness, shortness of breath, wheezing and stridor.   Cardiovascular: Negative for chest pain, palpitations and leg swelling.  Gastrointestinal: Negative for abdominal distention, abdominal pain, anal bleeding, blood in stool, constipation, diarrhea, nausea and vomiting.  Endocrine: Negative for cold intolerance, heat intolerance, polydipsia and polyphagia.  Musculoskeletal: Positive for arthralgias and myalgias. Negative for back pain, gait problem, joint swelling, neck pain and neck stiffness.  Skin: Positive for color change and wound. Negative for pallor and rash.  Neurological: Negative for dizziness, tremors, weakness and headaches.  Hematological: Does not bruise/bleed easily.       Objective:   Physical Exam Vitals signs and nursing note reviewed.  Constitutional:      General: She is not in acute distress.    Appearance: Normal appearance. She is not  ill-appearing, toxic-appearing or diaphoretic.  HENT:     Head: Normocephalic and atraumatic.  Musculoskeletal:        General: Tenderness and signs of injury present.     Right shoulder: She exhibits decreased range of motion. She exhibits no tenderness, no swelling, no crepitus, no deformity and no pain.     Right upper arm: She exhibits tenderness, swelling and edema. She exhibits no bony tenderness, no deformity and no laceration.     Left upper arm: Normal.       Arms:     Comments: RUE- Tenderness with palpation mid-lateral bicep.  Ecchymosis noted medial bicep.  R shoulder- no crepitus noted  Limited ROM: adduction, abduction, extension, internal rotation. Negative Hawkin's Impingement Sign, Neer's Impingement Sign,   Skin:    General: Skin is warm and dry.     Capillary Refill: Capillary refill takes less than 2 seconds.  Neurological:     Mental Status: She is alert and oriented to person, place, and time.  Psychiatric:        Mood and Affect: Mood normal.        Behavior: Behavior normal.        Thought Content: Thought content normal.        Judgment: Judgment normal.       Assessment & Plan:   1. Pain in right upper arm   2. Healthcare maintenance   3. Hypertension, unspecified type     Pain in right upper arm Please follow above care instructions. Recommend use of sling. Xray: FINDINGS: There is an avulsion of the greater tuberosity identified without significant displacement. The glenohumeral articulation is well seated. No other focal abnormality is noted. IMPRESSION: Undisplaced greater tuberosity avulsion. Pt called and updated on Radiologist's readings Referred to Orthopedic Specialist Recommend alternating OTC Acetaminophen and Ibuprofen as needed for pain control. COVID-19 Education: Continue to follow the Stay at Home order, and when out- Social Distance and wearing a facial mask when out of home.  Healthcare maintenance Follow-up in 2 weeks,  please schedule a morning appt and come fasting. COVID-19 Education: Continue to follow the Stay at Home order, and when out- Social Distance and wearing a facial mask when out of home.   HTN (hypertension) BP 139/83 Continue Losartan/HCTZ 50/12.5mg  QD    FOLLOW-UP:  Return in about 2 weeks (around 01/14/2019) for Musculoskeletal Pain.

## 2018-12-31 ENCOUNTER — Ambulatory Visit (INDEPENDENT_AMBULATORY_CARE_PROVIDER_SITE_OTHER): Payer: Medicare HMO | Admitting: Adult Health

## 2018-12-31 ENCOUNTER — Other Ambulatory Visit: Payer: Self-pay

## 2018-12-31 ENCOUNTER — Ambulatory Visit: Payer: Medicare HMO

## 2018-12-31 ENCOUNTER — Encounter: Payer: Self-pay | Admitting: Adult Health

## 2018-12-31 ENCOUNTER — Telehealth: Payer: Self-pay | Admitting: Adult Health

## 2018-12-31 VITALS — BP 139/83 | HR 76 | Temp 97.8°F | Ht 65.0 in | Wt 200.2 lb

## 2018-12-31 DIAGNOSIS — M79621 Pain in right upper arm: Secondary | ICD-10-CM

## 2018-12-31 DIAGNOSIS — S4991XA Unspecified injury of right shoulder and upper arm, initial encounter: Secondary | ICD-10-CM | POA: Diagnosis not present

## 2018-12-31 DIAGNOSIS — M79601 Pain in right arm: Secondary | ICD-10-CM | POA: Diagnosis not present

## 2018-12-31 DIAGNOSIS — I10 Essential (primary) hypertension: Secondary | ICD-10-CM | POA: Diagnosis not present

## 2018-12-31 DIAGNOSIS — Z0000 Encounter for general adult medical examination without abnormal findings: Secondary | ICD-10-CM | POA: Diagnosis not present

## 2018-12-31 DIAGNOSIS — E785 Hyperlipidemia, unspecified: Secondary | ICD-10-CM | POA: Diagnosis not present

## 2018-12-31 NOTE — Telephone Encounter (Signed)
Patient called states she has been staying w/ parent recovering from a surgical procedure  (MOM/ Valentina Lucks) @ [phone]

## 2018-12-31 NOTE — Assessment & Plan Note (Signed)
BP 139/83 Continue Losartan/HCTZ 50/12.5mg  QD

## 2018-12-31 NOTE — Assessment & Plan Note (Signed)
Follow-up in 2 weeks, please schedule a morning appt and come fasting. COVID-19 Education: Continue to follow the Stay at Home order, and when out- Social Distance and wearing a facial mask when out of home.

## 2018-12-31 NOTE — Assessment & Plan Note (Addendum)
Please follow above care instructions. Recommend use of sling. Xray: FINDINGS: There is an avulsion of the greater tuberosity identified without significant displacement. The glenohumeral articulation is well seated. No other focal abnormality is noted. IMPRESSION: Undisplaced greater tuberosity avulsion. Pt called and updated on Radiologist's readings Referred to Orthopedic Specialist Recommend alternating OTC Acetaminophen and Ibuprofen as needed for pain control. COVID-19 Education: Continue to follow the Stay at Home order, and when out- Social Distance and wearing a facial mask when out of home.

## 2018-12-31 NOTE — Patient Instructions (Addendum)
Contusion  A contusion is a deep bruise. Contusions are the result of a blunt injury to tissues and muscle fibers under the skin. The injury causes bleeding under the skin. The skin overlying the contusion may turn blue, purple, or yellow. Minor injuries will give you a painless contusion, but more severe contusions may stay painful and swollen for a few weeks. What are the causes? This condition is usually caused by a blow, trauma, or direct force to an area of the body. What are the signs or symptoms? Symptoms of this condition include:  Swelling of the injured area.  Pain and tenderness in the injured area.  Discoloration. The area may have redness and then turn blue, purple, or yellow. How is this diagnosed? This condition is diagnosed based on a physical exam and medical history. An X-ray, CT scan, or MRI may be needed to determine if there are any associated injuries, such as broken bones (fractures). How is this treated? Specific treatment for this condition depends on what area of the body was injured. In general, the best treatment for a contusion is resting, icing, applying pressure to (compression), and elevating the injured area. This is often called the RICE strategy. Over-the-counter anti-inflammatory medicines may also be recommended for pain control. Follow these instructions at home:  Rest the injured area.  If directed, apply ice to the injured area: ? Put ice in a plastic bag. ? Place a towel between your skin and the bag. ? Leave the ice on for 20 minutes, 2-3 times per day.  If directed, apply light compression to the injured area using an elastic bandage. Make sure the bandage is not wrapped too tightly. Remove and reapply the bandage as directed by your health care provider.  If possible, raise (elevate) the injured area above the level of your heart while you are sitting or lying down.  Take over-the-counter and prescription medicines only as told by your health  care provider. Contact a health care provider if:  Your symptoms do not improve after several days of treatment.  Your symptoms get worse.  You have difficulty moving the injured area. Get help right away if:  You have severe pain.  You have numbness in a hand or foot.  Your hand or foot turns pale or cold. This information is not intended to replace advice given to you by your health care provider. Make sure you discuss any questions you have with your health care provider. Document Released: 05/10/2005 Document Revised: 12/09/2015 Document Reviewed: 12/16/2014 Elsevier Interactive Patient Education  2019 [employer].  Please follow above care instructions. Recommend use of sling- pt reinforced to use sling. Xray: Avulsion Fx Pt called and updated on Radiologist's readings Referred to Orthopedic Specialist Recommend alternating OTC Acetaminophen and Ibuprofen as needed for pain control. Follow-up in 2 weeks, please schedule a morning appt and come fasting. COVID-19 Education: Continue to follow the Stay at Home order, and when out- Social Distance and wearing a facial mask when out of home. FEEL BETTER!

## 2019-01-01 ENCOUNTER — Other Ambulatory Visit: Payer: Self-pay | Admitting: Adult Health

## 2019-01-01 DIAGNOSIS — T148XXA Other injury of unspecified body region, initial encounter: Secondary | ICD-10-CM

## 2019-01-01 DIAGNOSIS — M79621 Pain in right upper arm: Secondary | ICD-10-CM

## 2019-01-01 LAB — COMPREHENSIVE METABOLIC PANEL
ALT: 14 IU/L (ref 0–32)
AST: 14 IU/L (ref 0–40)
Albumin/Globulin Ratio: 2.8 — ABNORMAL HIGH (ref 1.2–2.2)
Albumin: 4.4 g/dL (ref 3.8–4.8)
Alkaline Phosphatase: 59 IU/L (ref 39–117)
BUN/Creatinine Ratio: 12 (ref 12–28)
BUN: 7 mg/dL — ABNORMAL LOW (ref 8–27)
Bilirubin Total: 0.7 mg/dL (ref 0.0–1.2)
CO2: 25 mmol/L (ref 20–29)
Calcium: 9.8 mg/dL (ref 8.7–10.3)
Chloride: 104 mmol/L (ref 96–106)
Creatinine, Ser: 0.57 mg/dL (ref 0.57–1.00)
GFR calc Af Amer: 110 mL/min/{1.73_m2} (ref >59)
GFR calc non Af Amer: 96 mL/min/{1.73_m2} (ref >59)
Globulin, Total: 1.6 g/dL (ref 1.5–4.5)
Glucose: 98 mg/dL (ref 65–99)
Potassium: 4.6 mmol/L (ref 3.5–5.2)
Sodium: 142 mmol/L (ref 134–144)
Total Protein: 6 g/dL (ref 6.0–8.5)

## 2019-01-01 LAB — CBC WITH DIFFERENTIAL/PLATELET
Basophils Absolute: 0 10*3/uL (ref 0.0–0.2)
Basos: 0 %
EOS (ABSOLUTE): 0.2 10*3/uL (ref 0.0–0.4)
Eos: 3 %
Hematocrit: 43.3 % (ref 34.0–46.6)
Hemoglobin: 14.4 g/dL (ref 11.1–15.9)
Immature Grans (Abs): 0 10*3/uL (ref 0.0–0.1)
Immature Granulocytes: 0 %
Lymphocytes Absolute: 2.2 10*3/uL (ref 0.7–3.1)
Lymphs: 31 %
MCH: 29.3 pg (ref 26.6–33.0)
MCHC: 33.3 g/dL (ref 31.5–35.7)
MCV: 88 fL (ref 79–97)
Monocytes Absolute: 0.5 10*3/uL (ref 0.1–0.9)
Monocytes: 7 %
Neutrophils Absolute: 4.1 10*3/uL (ref 1.4–7.0)
Neutrophils: 59 %
Platelets: 279 10*3/uL (ref 150–450)
RBC: 4.91 x10E6/uL (ref 3.77–5.28)
RDW: 12.7 % (ref 11.7–15.4)
WBC: 7 10*3/uL (ref 3.4–10.8)

## 2019-01-01 LAB — TSH: TSH: 1.05 u[IU]/mL (ref 0.450–4.500)

## 2019-01-01 LAB — HEMOGLOBIN A1C
Est. average glucose Bld gHb Est-mCnc: 105 mg/dL
Hgb A1c MFr Bld: 5.3 % (ref 4.8–5.6)

## 2019-01-01 NOTE — Progress Notes (Unsigned)
FINDINGS: There is an avulsion of the greater tuberosity identified without significant displacement. The glenohumeral articulation is well seated. No other focal abnormality is noted.  IMPRESSION: Undisplaced greater tuberosity avulsion.

## 2019-01-01 NOTE — Telephone Encounter (Signed)
Pt informed of results.  Pt expressed understanding and is agreeable.  T. Ayaz Sondgeroth, CMA 

## 2019-01-08 ENCOUNTER — Telehealth: Payer: Self-pay | Admitting: Adult Health

## 2019-01-08 NOTE — Telephone Encounter (Signed)
Please have the patient come in for an OV.  Charyl Bigger, CMA

## 2019-01-08 NOTE — Telephone Encounter (Signed)
Patient called states lt leg/ankle got caught in leash while outside w/dog & it cut a place into her leg that now appears to be infected. ---- Patient request OV to have it looked at & wants to have labs (scheduled for 6/2 OV to be done tomorrow 5/27 at a tentative appt I booked for her.  --Please advise if this can be done & if we need to bring Pt in for OV.  --glh

## 2019-01-08 NOTE — Progress Notes (Deleted)
   Subjective:    Patient ID: Ruhi Kopke, female    DOB: 28-Aug-1950, 68 y.o.   MRN: [id number]  HPI:  Ms. Lainey presents with   Tetanus Vaccination  Patient Care Team    Relationship Specialty Notifications Start End  Mina Marble D, NP PCP - General Family Medicine  12/26/16   Allyn Kenner, MD  Dermatology  12/26/16   Starling Manns, MD  Orthopedic Surgery  12/26/16     Patient Active Problem List   Diagnosis Date Noted  . Pain in right upper arm 12/31/2018  . PTSD (post-traumatic stress disorder) 10/21/2018  . Anxiety in acute stress reaction 10/21/2018  . Healthcare maintenance 06/06/2018  . Screening for breast cancer 06/06/2018  . Encounter for Medicare annual wellness exam 12/06/2017  . Obesity with body mass index of 30.0-39.9 03/13/2017  . Hyperlipidemia 03/13/2017  . Vitamin D deficiency 01/11/2017  . HTN (hypertension) 12/26/2016  . Poor balance 12/26/2016  . H/O fatigue 12/26/2016  . Family history of high cholesterol 12/26/2016     Past Medical History:  Diagnosis Date  . Hypertension      No past surgical history on file.   Family History  Problem Relation Age of Onset  . Hyperlipidemia Mother   . Hypertension Mother   . Cancer Father   . Emphysema Father      Social History   Substance and Sexual Activity  Drug Use No     Social History   Substance and Sexual Activity  Alcohol Use No     Social History   Tobacco Use  Smoking Status Never Smoker  Smokeless Tobacco Never Used     Outpatient Encounter Medications as of 01/09/2019  Medication Sig  . losartan-hydrochlorothiazide (HYZAAR) 50-12.5 MG tablet TAKE 1 TABLET BY MOUTH EVERY DAY   No facility-administered encounter medications on file as of 01/09/2019.     Allergies: Penicillins and Plavix [clopidogrel bisulfate]  There is no height or weight on file to calculate BMI.  There were no vitals taken for this visit.     Review of Systems     Objective:   Physical Exam        Assessment & Plan:  No diagnosis found.  No problem-specific Assessment & Plan notes found for this encounter.    FOLLOW-UP:  No follow-ups on file.

## 2019-01-09 ENCOUNTER — Encounter: Payer: Self-pay | Admitting: Adult Health

## 2019-01-09 ENCOUNTER — Other Ambulatory Visit: Payer: Self-pay

## 2019-01-09 ENCOUNTER — Ambulatory Visit: Payer: Medicare HMO | Admitting: Orthopedic Surgery

## 2019-01-09 ENCOUNTER — Ambulatory Visit (INDEPENDENT_AMBULATORY_CARE_PROVIDER_SITE_OTHER): Payer: Medicare HMO | Admitting: Adult Health

## 2019-01-09 ENCOUNTER — Ambulatory Visit: Payer: Medicare HMO | Admitting: Adult Health

## 2019-01-09 VITALS — BP 139/60 | HR 67 | Temp 97.9°F | Ht 65.0 in | Wt 200.0 lb

## 2019-01-09 DIAGNOSIS — Z0000 Encounter for general adult medical examination without abnormal findings: Secondary | ICD-10-CM

## 2019-01-09 DIAGNOSIS — E785 Hyperlipidemia, unspecified: Secondary | ICD-10-CM

## 2019-01-09 DIAGNOSIS — M79621 Pain in right upper arm: Secondary | ICD-10-CM | POA: Diagnosis not present

## 2019-01-09 DIAGNOSIS — L089 Local infection of the skin and subcutaneous tissue, unspecified: Secondary | ICD-10-CM | POA: Diagnosis not present

## 2019-01-09 DIAGNOSIS — T148XXA Other injury of unspecified body region, initial encounter: Secondary | ICD-10-CM

## 2019-01-09 DIAGNOSIS — S81802A Unspecified open wound, left lower leg, initial encounter: Secondary | ICD-10-CM | POA: Diagnosis not present

## 2019-01-09 MED ORDER — MUPIROCIN 2 % EX OINT: 1.0000 "application " | TOPICAL_OINTMENT | Freq: Two times a day (BID) | CUTANEOUS | 0 refills | Status: DC

## 2019-01-09 NOTE — Progress Notes (Signed)
Subjective:    Patient ID: Cindy Figueroa, female    DOB: 1950/09/29, 68 y.o.   MRN: [id number]  HPI:  Ms. Cindy Figueroa presents with LLE wound r/t to injury that occurred on/about Dec 19, 2018. She reports letting on her dogs outside and the "teether wrapped around me leg, then pulled tight". She denies any pain or drainage. Center of wound developed eschar. Erythema developed around wound, however she denies noting any streaking. She denies any numbness/tinlging in left foot. She denies difficulty with ambulation. She denies fever/night sweats/N/V/D/malaise/poor appetite.  She reports applying OTC triple antibiotic cream on site the last 24 hrs and noticing 75% reduction in sx's since then.  Of Note- she suffered fall in early May (different from the fall described above), dx'd with Undisplaced greater tuberosity avulsion, referred to Ortho- she has appt later this afternoon.    Patient Care Team    Relationship Specialty Notifications Start End  Mina Marble D, NP PCP - General Family Medicine  12/26/16   Allyn Kenner, MD  Dermatology  12/26/16   Starling Manns, MD  Orthopedic Surgery  12/26/16     Patient Active Problem List   Diagnosis Date Noted  . Open wound of left lower leg 01/09/2019  . Pain in right upper arm 12/31/2018  . PTSD (post-traumatic stress disorder) 10/21/2018  . Anxiety in acute stress reaction 10/21/2018  . Healthcare maintenance 06/06/2018  . Screening for breast cancer 06/06/2018  . Encounter for Medicare annual wellness exam 12/06/2017  . Obesity with body mass index of 30.0-39.9 03/13/2017  . Hyperlipidemia 03/13/2017  . Vitamin D deficiency 01/11/2017  . HTN (hypertension) 12/26/2016  . Poor balance 12/26/2016  . H/O fatigue 12/26/2016  . Family history of high cholesterol 12/26/2016     Past Medical History:  Diagnosis Date  . Hypertension      History reviewed. No pertinent surgical history.   Family History  Problem Relation Age of Onset   . Hyperlipidemia Mother   . Hypertension Mother   . Cancer Father   . Emphysema Father      Social History   Substance and Sexual Activity  Drug Use No     Social History   Substance and Sexual Activity  Alcohol Use No     Social History   Tobacco Use  Smoking Status Never Smoker  Smokeless Tobacco Never Used     Outpatient Encounter Medications as of 01/09/2019  Medication Sig  . losartan-hydrochlorothiazide (HYZAAR) 50-12.5 MG tablet TAKE 1 TABLET BY MOUTH EVERY DAY  . mupirocin ointment (BACTROBAN) 2 % Apply 1 application topically 2 (two) times daily.   No facility-administered encounter medications on file as of 01/09/2019.     Allergies: Penicillins and Plavix [clopidogrel bisulfate]  Body mass index is 33.28 kg/m.  Blood pressure 139/60, pulse 67, temperature 97.9 F (36.6 C), temperature source Oral, height 5\' 5"  (1.651 m), weight 200 lb (90.7 kg), SpO2 98 %.  Review of Systems  Constitutional: Positive for fatigue. Negative for activity change, appetite change, chills, diaphoresis, fever and unexpected weight change.  Eyes: Negative for visual disturbance.  Respiratory: Negative for cough, chest tightness, shortness of breath, wheezing and stridor.   Cardiovascular: Negative for chest pain, palpitations and leg swelling.  Musculoskeletal: Positive for arthralgias and myalgias. Negative for back pain, gait problem, joint swelling, neck pain and neck stiffness.  Skin: Positive for color change and wound. Negative for pallor and rash.  Neurological: Negative for dizziness and headaches.  Hematological: Does  not bruise/bleed easily.       Objective:   Physical Exam Vitals signs and nursing note reviewed.  Constitutional:      General: She is not in acute distress.    Appearance: Normal appearance. She is not ill-appearing, toxic-appearing or diaphoretic.  HENT:     Head: Normocephalic and atraumatic.  Eyes:     Extraocular Movements: Extraocular  movements intact.     Conjunctiva/sclera: Conjunctivae normal.     Pupils: Pupils are equal, round, and reactive to light.  Cardiovascular:     Rate and Rhythm: Normal rate.     Pulses: Normal pulses.     Heart sounds: Normal heart sounds. No murmur. No friction rub. No gallop.   Pulmonary:     Effort: Pulmonary effort is normal. No respiratory distress.     Breath sounds: Normal breath sounds. No stridor. No wheezing, rhonchi or rales.  Chest:     Chest wall: No tenderness.  Skin:    General: Skin is warm and dry.     Capillary Refill: Capillary refill takes less than 2 seconds.     Findings: Erythema and wound present.          Comments: LLE: L lower leg, 2 inches above lateral malleolus 3.5cm diameter of erythema 1cm diameter of eschar Not tender to the touch No drainage or streaking noted Strong pedal pulses  Neurological:     Mental Status: She is alert and oriented to person, place, and time.  Psychiatric:        Mood and Affect: Mood normal.        Behavior: Behavior normal.        Thought Content: Thought content normal.        Judgment: Judgment normal.       Assessment & Plan:   1. Wound infection   2. Hyperlipidemia, unspecified hyperlipidemia type   3. Healthcare maintenance   4. Open wound of left lower leg, initial encounter   5. Pain in right upper arm     Open wound of left lower leg Please follow Wound Care instructions as detailed above. Please apply Mupirocin 2% ointment twice daily. We will call when lab results are available.   Pain in right upper arm Continue to wear right arm sling. Please keep your appt. with Orthopedic Specialist today.    FOLLOW-UP:  Return if symptoms worsen or fail to improve.

## 2019-01-09 NOTE — Patient Instructions (Addendum)
Wound Care, Adult Taking care of your wound properly can help to prevent pain, infection, and scarring. It can also help your wound to heal more quickly. How to care for your wound Wound care      Follow instructions from your health care provider about how to take care of your wound. Make sure you: ? Wash your hands with soap and water before you change the bandage (dressing). If soap and water are not available, use hand sanitizer. ? Change your dressing as told by your health care provider. ? Leave stitches (sutures), skin glue, or adhesive strips in place. These skin closures may need to stay in place for 2 weeks or longer. If adhesive strip edges start to loosen and curl up, you may trim the loose edges. Do not remove adhesive strips completely unless your health care provider tells you to do that.  Check your wound area every day for signs of infection. Check for: ? Redness, swelling, or pain. ? Fluid or blood. ? Warmth. ? Pus or a bad smell.  Ask your health care provider if you should clean the wound with mild soap and water. Doing this may include: ? Using a clean towel to pat the wound dry after cleaning it. Do not rub or scrub the wound. ? Applying a cream or ointment. Do this only as told by your health care provider. ? Covering the incision with a clean dressing.  Ask your health care provider when you can leave the wound uncovered.  Keep the dressing dry until your health care provider says it can be removed. Do not take baths, swim, use a hot tub, or do anything that would put the wound underwater until your health care provider approves. Ask your health care provider if you can take showers. You may only be allowed to take sponge baths. Medicines   If you were prescribed an antibiotic medicine, cream, or ointment, take or use the antibiotic as told by your health care provider. Do not stop taking or using the antibiotic even if your condition improves.  Take  over-the-counter and prescription medicines only as told by your health care provider. If you were prescribed pain medicine, take it 30 or more minutes before you do any wound care or as told by your health care provider. General instructions  Return to your normal activities as told by your health care provider. Ask your health care provider what activities are safe.  Do not scratch or pick at the wound.  Do not use any products that contain nicotine or tobacco, such as cigarettes and e-cigarettes. These may delay wound healing. If you need help quitting, ask your health care provider.  Keep all follow-up visits as told by your health care provider. This is important.  Eat a diet that includes protein, vitamin A, vitamin C, and other nutrient-rich foods to help the wound heal. ? Foods rich in protein include meat, dairy, beans, nuts, and other sources. ? Foods rich in vitamin A include carrots and dark green, leafy vegetables. ? Foods rich in vitamin C include citrus, tomatoes, and other fruits and vegetables. ? Nutrient-rich foods have protein, carbohydrates, fat, vitamins, or minerals. Eat a variety of healthy foods including vegetables, fruits, and whole grains. Contact a health care provider if:  You received a tetanus shot and you have swelling, severe pain, redness, or bleeding at the injection site.  Your pain is not controlled with medicine.  You have redness, swelling, or pain around the wound.    You have fluid or blood coming from the wound.  Your wound feels warm to the touch.  You have pus or a bad smell coming from the wound.  You have a fever or chills.  You are nauseous or you vomit.  You are dizzy. Get help right away if:  You have a red streak going away from your wound.  The edges of the wound open up and separate.  Your wound is bleeding, and the bleeding does not stop with gentle pressure.  You have a rash.  You faint.  You have trouble breathing.  Summary  Always wash your hands with soap and water before changing your bandage (dressing).  To help with healing, eat foods that are rich in protein, vitamin A, vitamin C, and other nutrients.  Check your wound every day for signs of infection. Contact your health care provider if you suspect that your wound is infected. This information is not intended to replace advice given to you by your health care provider. Make sure you discuss any questions you have with your health care provider. Document Released: 05/09/2008 Document Revised: 09/11/2017 Document Reviewed: 02/15/2016 Elsevier Interactive Patient Education  2019 [place].  Please follow Wound Care instructions as detailed above. Please apply Mupirocin 2% ointment twice daily. We will call when lab results are available. Continue to wear right arm sling. Please keep your appt. with Orthopedic Specialist today. FEEL BETTER!

## 2019-01-09 NOTE — Assessment & Plan Note (Signed)
Please follow Wound Care instructions as detailed above. Please apply Mupirocin 2% ointment twice daily. We will call when lab results are available.

## 2019-01-09 NOTE — Assessment & Plan Note (Signed)
Continue to wear right arm sling. Please keep your appt. with Orthopedic Specialist today.

## 2019-01-10 LAB — CBC WITH DIFFERENTIAL/PLATELET
Basophils Absolute: 0 10*3/uL (ref 0.0–0.2)
Basos: 1 %
EOS (ABSOLUTE): 0.2 10*3/uL (ref 0.0–0.4)
Eos: 3 %
Hematocrit: 42.8 % (ref 34.0–46.6)
Hemoglobin: 14.3 g/dL (ref 11.1–15.9)
Immature Grans (Abs): 0 10*3/uL (ref 0.0–0.1)
Immature Granulocytes: 0 %
Lymphocytes Absolute: 1.8 10*3/uL (ref 0.7–3.1)
Lymphs: 26 %
MCH: 28.8 pg (ref 26.6–33.0)
MCHC: 33.4 g/dL (ref 31.5–35.7)
MCV: 86 fL (ref 79–97)
Monocytes Absolute: 0.4 10*3/uL (ref 0.1–0.9)
Monocytes: 6 %
Neutrophils Absolute: 4.6 10*3/uL (ref 1.4–7.0)
Neutrophils: 64 %
Platelets: 314 10*3/uL (ref 150–450)
RBC: 4.96 x10E6/uL (ref 3.77–5.28)
RDW: 12.7 % (ref 11.7–15.4)
WBC: 7.2 10*3/uL (ref 3.4–10.8)

## 2019-01-10 LAB — LIPID PANEL
Chol/HDL Ratio: 4.1 ratio (ref 0.0–4.4)
Cholesterol, Total: 178 mg/dL (ref 100–199)
HDL: 43 mg/dL (ref >39)
LDL Calculated: 118 mg/dL — ABNORMAL HIGH (ref 0–99)
Triglycerides: 84 mg/dL (ref 0–149)
VLDL Cholesterol Cal: 17 mg/dL (ref 5–40)

## 2019-01-14 ENCOUNTER — Ambulatory Visit: Payer: Medicare HMO | Admitting: Adult Health

## 2019-01-15 ENCOUNTER — Other Ambulatory Visit: Payer: Self-pay

## 2019-01-15 ENCOUNTER — Encounter: Payer: Self-pay | Admitting: Orthopedic Surgery

## 2019-01-15 ENCOUNTER — Ambulatory Visit (INDEPENDENT_AMBULATORY_CARE_PROVIDER_SITE_OTHER): Payer: Medicare HMO | Admitting: Orthopedic Surgery

## 2019-01-15 ENCOUNTER — Ambulatory Visit (INDEPENDENT_AMBULATORY_CARE_PROVIDER_SITE_OTHER): Payer: Medicare HMO

## 2019-01-15 DIAGNOSIS — M25511 Pain in right shoulder: Secondary | ICD-10-CM | POA: Diagnosis not present

## 2019-01-15 DIAGNOSIS — S42294A Other nondisplaced fracture of upper end of right humerus, initial encounter for closed fracture: Secondary | ICD-10-CM

## 2019-01-15 MED ORDER — TRAMADOL HCL 50 MG PO TABS: ORAL_TABLET | ORAL | 0 refills | Status: DC

## 2019-01-15 NOTE — Progress Notes (Signed)
Office Visit Note   Patient: Cindy Figueroa           Date of Birth: 1950/09/19           MRN: [id number] Visit Date: 01/15/2019 Requested by: Esaw Grandchild, NP [place], [place] [address] PCP: Esaw Grandchild, NP  Subjective: Chief Complaint  Patient presents with  . Right Shoulder - Injury    HPI: Cindy Figueroa is a patient with right shoulder pain.  She had a fall 12/26/2018.  Radiographs at that time demonstrated greater tuberosity fracture.  She is been having a lot of pain in the proximal aspect of her humerus.  She has been in a sling.  Taking Tylenol and ibuprofen.  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              ROS: All systems reviewed are negative as they relate to the chief complaint within the history of present illness.  Patient denies  fevers or chills.   Assessment & Plan: Visit Diagnoses:  1. Acute pain of right shoulder   2. Other closed nondisplaced fracture of proximal end of right humerus, initial encounter     Plan: Impression is right proximal humerus fracture greater tuberosity with no real displacement.  The fracture is remained stable over the past 2 weeks.  I will have her start doing passive range of motion only.  Continue sling use for at least another week or 2 but do start range of motion so her shoulder does not get frozen.  Repeat radiographs on return in 2 weeks and then I will likely start her in a more aggressive rehab program.  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  I did prescribe tramadol for pain 1 every 12 hours #30.  New shoulder immobilizer also provided.  Follow-Up Instructions: Return in about 2 weeks (around 01/29/2019).   Orders:  Orders Placed This Encounter  Procedures  . XR Shoulder Right   Meds ordered this encounter  Medications  . traMADol (ULTRAM) 50 MG tablet    Sig: 1 po bid prn pain    Dispense:  30 tablet    Refill:  0      Procedures: No procedures  performed   Clinical Data: No additional findings.  Objective: Vital Signs: There were no vitals taken for this visit.  Physical Exam:   Constitutional: Patient appears well-developed HEENT:  Head: Normocephalic Eyes:EOM are normal Neck: Normal range of motion Cardiovascular: Normal rate Pulmonary/chest: Effort normal Neurologic: Patient is alert Skin: Skin is warm Psychiatric: Patient has normal mood and affect    Ortho Exam: Ortho exam demonstrates normal gait alignment.  Both shoulders have actually pretty reasonable rotator cuff strength isolated infraspinatus supraspinatus and subscap muscle testing but it is about half grade weaker on the right than the left.  No coarse grinding or crepitus with passive range of motion of that right shoulder.  Actively the patient does not have 90 degrees of forward flexion and abduction on the right-hand side she does have on the left.  Specialty Comments:  No specialty comments available.  Imaging: Xr Shoulder Right  Result Date: 01/15/2019 AP outlet and axillary right shoulder reviewed.  Greater tuberosity fracture again noted which is nondisplaced comparison to radiographs from 12/31/2018.  Shoulder is located.  No other bony abnormality present.    PMFS History: Patient Active Problem List   Diagnosis Date Noted  . Open  wound of left lower leg 01/09/2019  . Pain in right upper arm 12/31/2018  . PTSD (post-traumatic stress disorder) 10/21/2018  . Anxiety in acute stress reaction 10/21/2018  . Healthcare maintenance 06/06/2018  . Screening for breast cancer 06/06/2018  . Encounter for Medicare annual wellness exam 12/06/2017  . Obesity with body mass index of 30.0-39.9 03/13/2017  . Hyperlipidemia 03/13/2017  . Vitamin D deficiency 01/11/2017  . HTN (hypertension) 12/26/2016  . Poor balance 12/26/2016  . H/O fatigue 12/26/2016  . Family history of high cholesterol 12/26/2016   Past Medical History:  Diagnosis Date  .  Hypertension     Family History  Problem Relation Age of Onset  . Hyperlipidemia Mother   . Hypertension Mother   . Cancer Father   . Emphysema Father     History reviewed. No pertinent surgical history. Social History   Occupational History  . Not on file  Tobacco Use  . Smoking status: Never Smoker  . Smokeless tobacco: Never Used  Substance and Sexual Activity  . Alcohol use: No  . Drug use: No  . Sexual activity: Not on file

## 2019-01-21 NOTE — Progress Notes (Signed)
Lifestyle modifications: Increase water intake  Follow Heart Healthy diet- fruits, vegetables, avoid saturated fats (cheese, red meat, whole milk, ice cream). Increase regular exercise.  Recommend at least 30 minutes daily, 5 days per week of walking, jogging, biking, swimming, YouTube/Pinterest workout videos. We can recheck lipids in 6 months and if not dramatically improved, then recommend starting a statin. SHE CAN DO IT! Thanks! Valetta Fuller

## 2019-01-30 ENCOUNTER — Ambulatory Visit: Payer: Medicare HMO | Admitting: Orthopedic Surgery

## 2019-01-31 ENCOUNTER — Ambulatory Visit (INDEPENDENT_AMBULATORY_CARE_PROVIDER_SITE_OTHER): Payer: Medicare HMO | Admitting: Orthopedic Surgery

## 2019-01-31 ENCOUNTER — Other Ambulatory Visit: Payer: Self-pay

## 2019-01-31 ENCOUNTER — Ambulatory Visit (INDEPENDENT_AMBULATORY_CARE_PROVIDER_SITE_OTHER): Payer: Medicare HMO

## 2019-01-31 ENCOUNTER — Encounter: Payer: Self-pay | Admitting: Orthopedic Surgery

## 2019-01-31 DIAGNOSIS — S42294A Other nondisplaced fracture of upper end of right humerus, initial encounter for closed fracture: Secondary | ICD-10-CM | POA: Diagnosis not present

## 2019-01-31 NOTE — Progress Notes (Signed)
   Post-Op Visit Note   Patient: Cindy Figueroa           Date of Birth: 15-Nov-1950           MRN: [id number] Visit Date: 01/31/2019 PCP: Esaw Grandchild, NP   Assessment & Plan:  Chief Complaint:  Chief Complaint  Patient presents with  . Right Shoulder - Follow-up   Visit Diagnoses:  1. Other closed nondisplaced fracture of proximal end of right humerus, initial encounter     Plan: Cindy Figueroa is a patient who is now about a month out right proximal humerus fracture of the greater tuberosity.  She is been doing well.  On exam she has improving strength and pretty reasonable passive range of motion.  I like to DC the sling at this time but not let her do any lifting with that right arm until I see her back in 4 weeks.  Okay to continue to do pendulum exercises and passive range of motion only.  We will need final set of x-rays in 1 month and likely release at that time.  Follow-Up Instructions: Return in about 4 weeks (around 02/28/2019).   Orders:  Orders Placed This Encounter  Procedures  . XR Shoulder Right   No orders of the defined types were placed in this encounter.   Imaging: Xr Shoulder Right  Result Date: 01/31/2019 AP internal rotation and external rotation right proximal humerus reviewed.  Greater tuberosity fracture is present which is unchanged in alignment.  Remains below the level of the humeral head.  Shoulder is located.  No other acute fracture or abnormality present.   PMFS History: Patient Active Problem List   Diagnosis Date Noted  . Open wound of left lower leg 01/09/2019  . Pain in right upper arm 12/31/2018  . PTSD (post-traumatic stress disorder) 10/21/2018  . Anxiety in acute stress reaction 10/21/2018  . Healthcare maintenance 06/06/2018  . Screening for breast cancer 06/06/2018  . Encounter for Medicare annual wellness exam 12/06/2017  . Obesity with body mass index of 30.0-39.9 03/13/2017  . Hyperlipidemia 03/13/2017  . Vitamin D  deficiency 01/11/2017  . HTN (hypertension) 12/26/2016  . Poor balance 12/26/2016  . H/O fatigue 12/26/2016  . Family history of high cholesterol 12/26/2016   Past Medical History:  Diagnosis Date  . Hypertension     Family History  Problem Relation Age of Onset  . Hyperlipidemia Mother   . Hypertension Mother   . Cancer Father   . Emphysema Father     History reviewed. No pertinent surgical history. Social History   Occupational History  . Not on file  Tobacco Use  . Smoking status: Never Smoker  . Smokeless tobacco: Never Used  Substance and Sexual Activity  . Alcohol use: No  . Drug use: No  . Sexual activity: Not on file

## 2019-02-03 ENCOUNTER — Telehealth: Payer: Self-pay | Admitting: Orthopedic Surgery

## 2019-02-03 NOTE — Telephone Encounter (Signed)
Rx refill Tramadol °

## 2019-02-03 NOTE — Telephone Encounter (Signed)
Ok to rf? 

## 2019-02-04 ENCOUNTER — Other Ambulatory Visit: Payer: Self-pay | Admitting: Orthopedic Surgery

## 2019-02-04 MED ORDER — TRAMADOL HCL 50 MG PO TABS: ORAL_TABLET | ORAL | 0 refills | Status: DC

## 2019-02-04 NOTE — Telephone Encounter (Signed)
Called to pharmacy. Advised done.

## 2019-02-04 NOTE — Telephone Encounter (Signed)
y

## 2019-02-25 ENCOUNTER — Other Ambulatory Visit: Payer: Self-pay | Admitting: Adult Health

## 2019-02-28 ENCOUNTER — Ambulatory Visit (INDEPENDENT_AMBULATORY_CARE_PROVIDER_SITE_OTHER): Payer: Medicare HMO | Admitting: Orthopedic Surgery

## 2019-02-28 ENCOUNTER — Other Ambulatory Visit: Payer: Self-pay

## 2019-02-28 ENCOUNTER — Encounter: Payer: Self-pay | Admitting: Orthopedic Surgery

## 2019-02-28 ENCOUNTER — Ambulatory Visit (INDEPENDENT_AMBULATORY_CARE_PROVIDER_SITE_OTHER): Payer: Medicare HMO

## 2019-02-28 DIAGNOSIS — S42294A Other nondisplaced fracture of upper end of right humerus, initial encounter for closed fracture: Secondary | ICD-10-CM

## 2019-02-28 NOTE — Progress Notes (Signed)
   Post-Op Visit Note   Patient: Cindy Figueroa           Date of Birth: 11-17-1950           MRN: [id number] Visit Date: 02/28/2019 PCP: Esaw Grandchild, NP   Assessment & Plan:  Chief Complaint: No chief complaint on file.  Visit Diagnoses:  1. Other closed nondisplaced fracture of proximal end of right humerus, initial encounter     Plan: Pt is following up with the clinic 2 months out from a R proximal humerus fracture.  Pt's XRs taken today show radiographic evidence of appropriate healing.  Pt notes some pain at the end of her ROM and reduced ROM of the R shoulder when compared to the L shoulder.  Pain has been improving overall over the last 4 weeks.  On exam, pt's fracture site feels to be moving as a unit.  She has reduced passive ER, forward flexion, and Abduction compared to the contralateral side.  Gave patient a list of exercises to do to improve her stiffness, counseled her that it may take a couple months to improve.  Pt understands.  She will f/u with the office prn.    Follow-Up Instructions: No follow-ups on file.   Orders:  Orders Placed This Encounter  Procedures  . XR Shoulder Right   No orders of the defined types were placed in this encounter.   Imaging: No results found.  PMFS History: Patient Active Problem List   Diagnosis Date Noted  . Open wound of left lower leg 01/09/2019  . Pain in right upper arm 12/31/2018  . PTSD (post-traumatic stress disorder) 10/21/2018  . Anxiety in acute stress reaction 10/21/2018  . Healthcare maintenance 06/06/2018  . Screening for breast cancer 06/06/2018  . Encounter for Medicare annual wellness exam 12/06/2017  . Obesity with body mass index of 30.0-39.9 03/13/2017  . Hyperlipidemia 03/13/2017  . Vitamin D deficiency 01/11/2017  . HTN (hypertension) 12/26/2016  . Poor balance 12/26/2016  . H/O fatigue 12/26/2016  . Family history of high cholesterol 12/26/2016   Past Medical History:  Diagnosis Date  .  Hypertension     Family History  Problem Relation Age of Onset  . Hyperlipidemia Mother   . Hypertension Mother   . Cancer Father   . Emphysema Father     No past surgical history on file. Social History   Occupational History  . Not on file  Tobacco Use  . Smoking status: Never Smoker  . Smokeless tobacco: Never Used  Substance and Sexual Activity  . Alcohol use: No  . Drug use: No  . Sexual activity: Not on file

## 2019-05-09 DIAGNOSIS — L820 Inflamed seborrheic keratosis: Secondary | ICD-10-CM | POA: Diagnosis not present

## 2019-05-09 DIAGNOSIS — L905 Scar conditions and fibrosis of skin: Secondary | ICD-10-CM | POA: Diagnosis not present

## 2019-05-09 DIAGNOSIS — R208 Other disturbances of skin sensation: Secondary | ICD-10-CM | POA: Diagnosis not present

## 2019-05-20 ENCOUNTER — Other Ambulatory Visit: Payer: Self-pay | Admitting: Adult Health

## 2019-05-20 ENCOUNTER — Telehealth: Payer: Self-pay

## 2019-05-20 NOTE — Telephone Encounter (Signed)
Please call pt to schedule f/u.  No further refills until pt is seen.  T. Trinaty Bundrick, CMA 

## 2019-06-09 IMAGING — DX RIGHT HUMERUS - 2+ VIEW
2 series · 2 of 2 positions shown · non-contrast
Comparison: None.

CLINICAL DATA: Right upper arm pain following fall, initial
encounter

EXAM:
RIGHT HUMERUS - 2+ VIEW

[humerus ap]
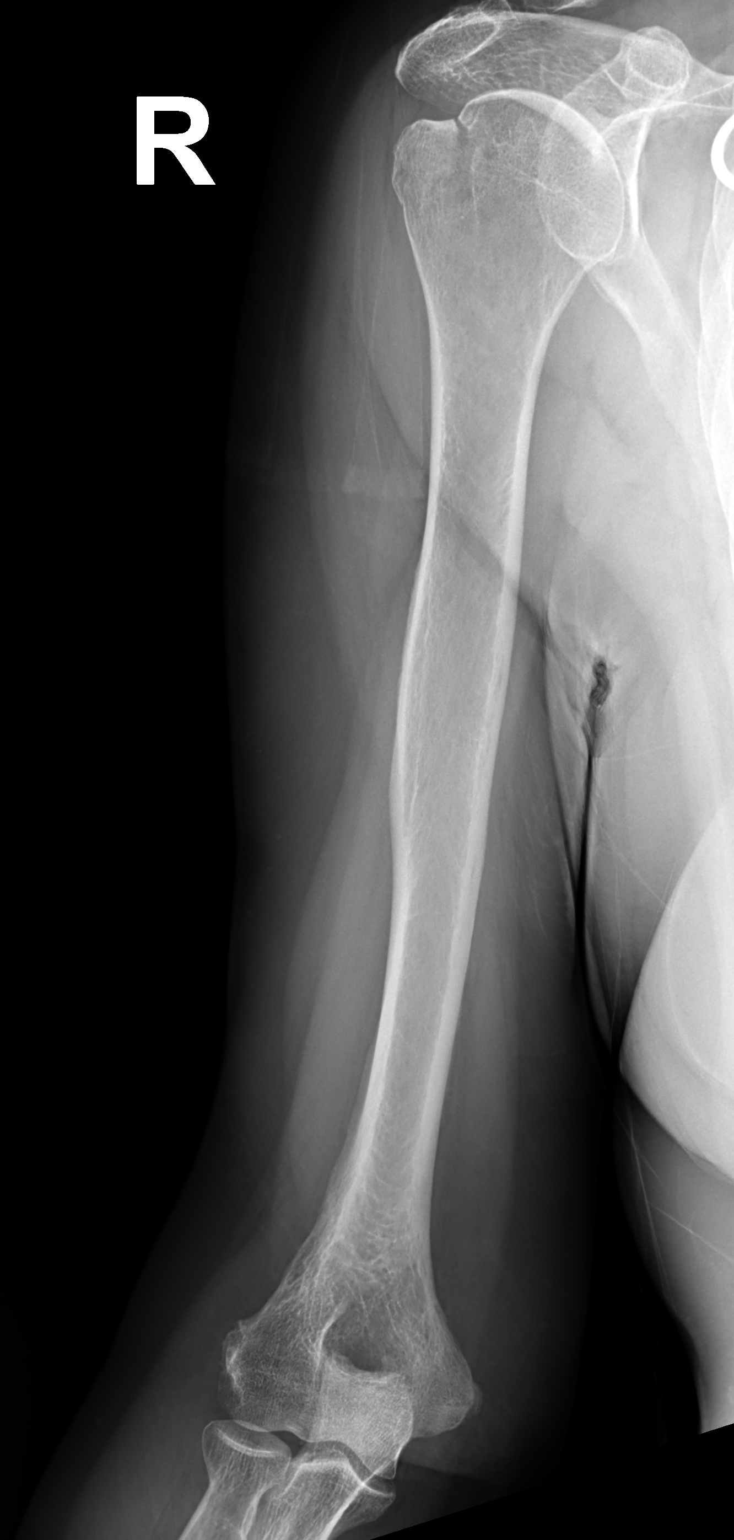

[humerus lat]
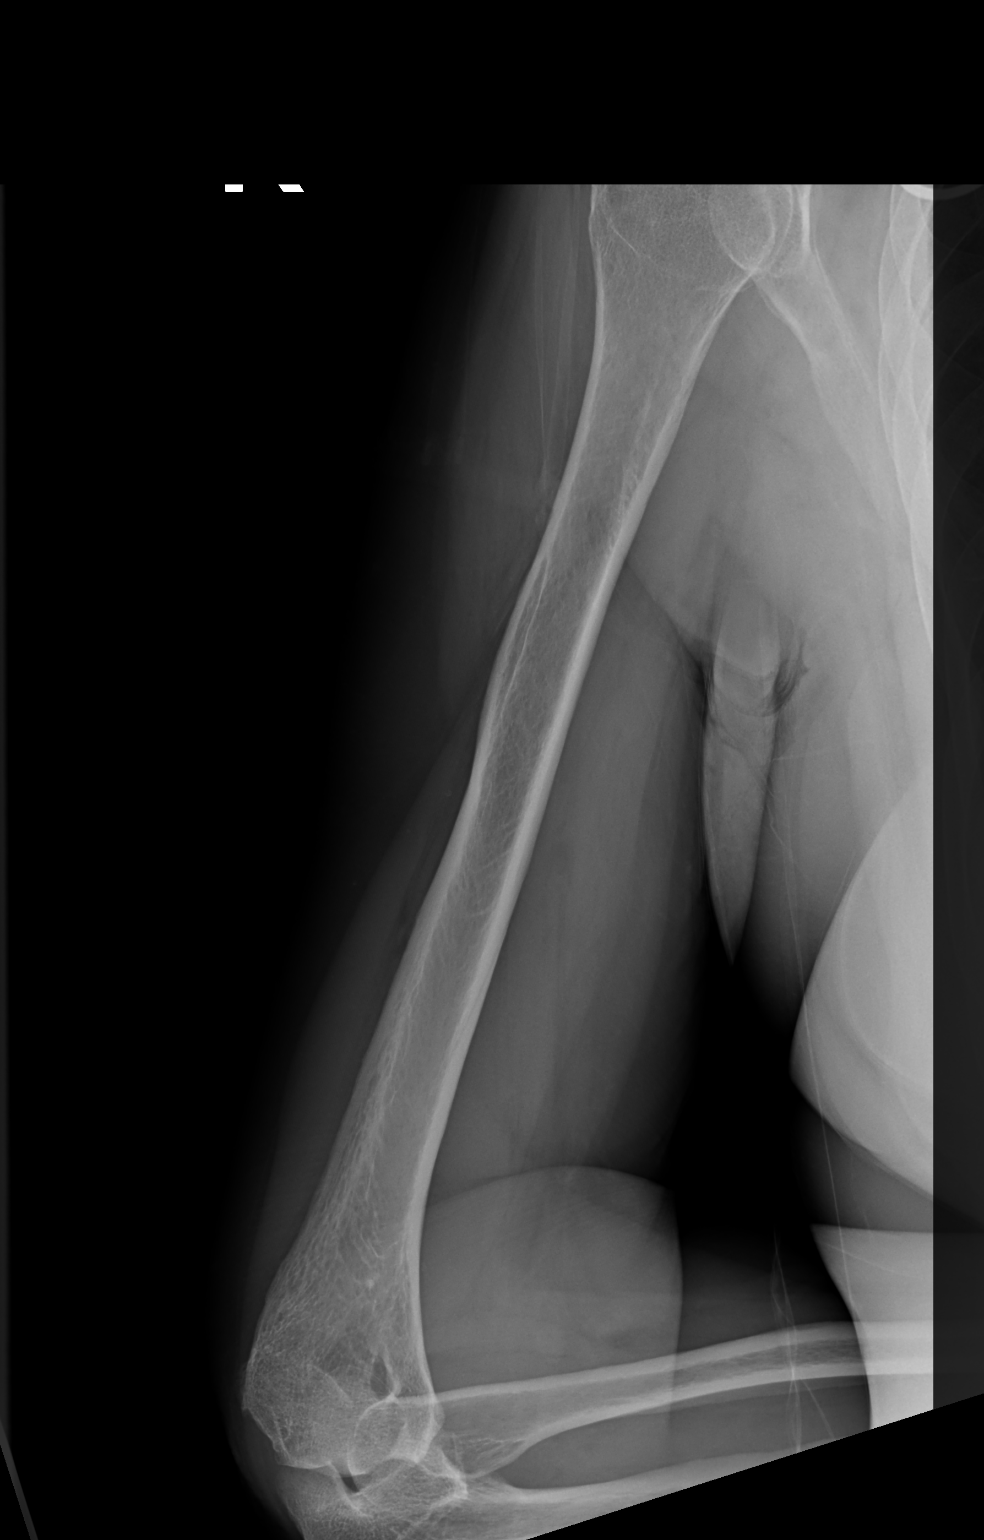

[2 of 2 positions shown; findings below may reference images not displayed]

FINDINGS: There is an avulsion of the greater tuberosity identified without
significant displacement. The glenohumeral articulation is well
seated. No other focal abnormality is noted.
IMPRESSION: Undisplaced greater tuberosity avulsion.

## 2019-06-12 ENCOUNTER — Other Ambulatory Visit: Payer: Self-pay | Admitting: Adult Health

## 2019-06-20 ENCOUNTER — Other Ambulatory Visit: Payer: Self-pay | Admitting: Adult Health

## 2019-06-20 DIAGNOSIS — Z1231 Encounter for screening mammogram for malignant neoplasm of breast: Secondary | ICD-10-CM

## 2019-06-26 ENCOUNTER — Other Ambulatory Visit: Payer: Self-pay | Admitting: Adult Health

## 2019-06-26 NOTE — Progress Notes (Signed)
Virtual Visit via Telephone Note  I connected with Cindy Figueroa on 06/30/19 at 10:15 AM EST by telephone and verified that I am speaking with the correct person using two identifiers.  Location: Patient: Home Provider: In Clinic   I discussed the limitations, risks, security and privacy concerns of performing an evaluation and management service by telephone and the availability of in person appointments. I also discussed with the patient that there may be a patient responsible charge related to this service. The patient expressed understanding and agreed to proceed.   History of Present Illness: Cindy Figueroa is here for regular f/u: HTN, HLD, Obesity, PTSD She reports ambulatory readings- SBP 120-130 DBP 60-70s HR -Upper 60s She denies CP/chest tightness/palpitations/HA/dizzines She estimates to drink >60 oz water/day She rides her stationary bike 30-60 mins 5 days per week She follows heart healthy diet She reports stable mood, denies SI/HI  Needs Lipid Panel  01/10/2019 Labs-  CBC-WNL  The 10-year ASCVD risk score Mikey Bussing DC Jr., et al., 2013) is: 12.2%  Values used to calculate the score:   Age: 68 years   Sex: Female   Is Non-Hispanic African American: No   Diabetic: No   Tobacco smoker: No   Systolic Blood Pressure: [id number] mmHg   Is BP treated: Yes   HDL Cholesterol: 43 mg/dL   Total Cholesterol: 178 mg/dL  LDL-118  LDL has reduced from 125 last year, however her ASCVD is 12%, opt 5%, recommend starting her on Atorvastatin 10mg  nightly at 1800.   Patient Care Team    Relationship Specialty Notifications Start End  Mina Marble D, NP PCP - General Family Medicine  12/26/16   Allyn Kenner, MD  Dermatology  12/26/16   Starling Manns, MD  Orthopedic Surgery  12/26/16     Patient Active Problem List   Diagnosis Date Noted  . Open wound of left lower leg 01/09/2019  . Pain in right upper arm 12/31/2018  . PTSD (post-traumatic stress disorder)  10/21/2018  . Anxiety in acute stress reaction 10/21/2018  . Healthcare maintenance 06/06/2018  . Screening for breast cancer 06/06/2018  . Encounter for Medicare annual wellness exam 12/06/2017  . Obesity with body mass index of 30.0-39.9 03/13/2017  . Hyperlipidemia 03/13/2017  . Vitamin D deficiency 01/11/2017  . HTN (hypertension) 12/26/2016  . Poor balance 12/26/2016  . H/O fatigue 12/26/2016  . Family history of high cholesterol 12/26/2016     Past Medical History:  Diagnosis Date  . Hypertension      History reviewed. No pertinent surgical history.   Family History  Problem Relation Age of Onset  . Hyperlipidemia Mother   . Hypertension Mother   . Cancer Father   . Emphysema Father      Social History   Substance and Sexual Activity  Drug Use No     Social History   Substance and Sexual Activity  Alcohol Use No     Social History   Tobacco Use  Smoking Status Never Smoker  Smokeless Tobacco Never Used     Outpatient Encounter Medications as of 06/30/2019  Medication Sig  . losartan-hydrochlorothiazide (HYZAAR) 50-12.5 MG tablet TAKE 1 TABLET BY MOUTH DAILY. OFFICE VISIT REQUIRED PRIOR TO ANY FURTHER REFILLS  . UNABLE TO FIND Take 2 capsules by mouth daily. Med Name: Thomasena Edis  . [DISCONTINUED] mupirocin ointment (BACTROBAN) 2 % Apply 1 application topically 2 (two) times daily.  . [DISCONTINUED] traMADol (ULTRAM) 50 MG tablet 1 po bid prn pain   No  facility-administered encounter medications on file as of 06/30/2019.     Allergies: Penicillins and Plavix [clopidogrel bisulfate]  Body mass index is 32.95 kg/m.  Blood pressure 125/68, pulse 78, temperature 97.7 F (36.5 C), temperature source Oral, height 5\' 5"  (1.651 m), weight 198 lb (89.8 kg). Review of Systems: General:   Denies fever, chills, unexplained weight loss.  Optho/Auditory:   Denies visual changes, blurred vision/LOV Respiratory:   Denies SOB, DOE more than baseline  levels.  Cardiovascular:   Denies chest pain, palpitations, new onset peripheral edema  Gastrointestinal:   Denies nausea, vomiting, diarrhea.  Genitourinary: Denies dysuria, freq/ urgency, flank pain or discharge from genitals.  Endocrine:     Denies hot or cold intolerance, polyuria, polydipsia. Musculoskeletal:   Denies unexplained myalgias, joint swelling, unexplained arthralgias, gait problems.  Skin:  Denies rash, suspicious lesions Neurological:     Denies dizziness, unexplained weakness, numbness  Psychiatric/Behavioral:   Denies mood changes, suicidal or homicidal ideations, hallucinations  Observations/Objective: No acute distress noted during telephone conversation  Assessment and Plan: Continue all medications as directed. Losartan/HCTZ 50/12.5mg  QD CMP 12/2018-stable Remain well hydrated, follow heart healthy diet. Continue frequent stationary biking. Needs lipid panel. Continue to social distance and wear a mask when in public.  Follow Up Instructions: Lipid panel 1 weeks OV 3 months    I discussed the assessment and treatment plan with the patient. The patient was provided an opportunity to ask questions and all were answered. The patient agreed with the plan and demonstrated an understanding of the instructions.   The patient was advised to call back or seek an in-person evaluation if the symptoms worsen or if the condition fails to improve as anticipated.  I provided 9 minutes of non-face-to-face time during this encounter.   Esaw Grandchild, NP

## 2019-06-30 ENCOUNTER — Ambulatory Visit (INDEPENDENT_AMBULATORY_CARE_PROVIDER_SITE_OTHER): Payer: Medicare HMO | Admitting: Adult Health

## 2019-06-30 ENCOUNTER — Encounter: Payer: Self-pay | Admitting: Adult Health

## 2019-06-30 VITALS — BP 125/68 | HR 78 | Temp 97.7°F | Ht 65.0 in | Wt 198.0 lb

## 2019-06-30 DIAGNOSIS — E785 Hyperlipidemia, unspecified: Secondary | ICD-10-CM | POA: Diagnosis not present

## 2019-06-30 DIAGNOSIS — I10 Essential (primary) hypertension: Secondary | ICD-10-CM | POA: Diagnosis not present

## 2019-06-30 MED ORDER — LOSARTAN POTASSIUM-HCTZ 50-12.5 MG PO TABS: 1.0000 | ORAL_TABLET | Freq: Every day | ORAL | 0 refills | Status: DC

## 2019-06-30 NOTE — Assessment & Plan Note (Signed)
Assessment and Plan: Continue all medications as directed. Losartan/HCTZ 50/12.5mg  QD CMP 12/2018-stable Remain well hydrated, follow heart healthy diet. Continue frequent stationary biking. Needs lipid panel. Continue to social distance and wear a mask when in public.  Follow Up Instructions: Lipid panel 1 weeks OV 3 months    I discussed the assessment and treatment plan with the patient. The patient was provided an opportunity to ask questions and all were answered. The patient agreed with the plan and demonstrated an understanding of the instructions.   The patient was advised to call back or seek an in-person evaluation if the symptoms worsen or if the condition fails to improve as anticipated.

## 2019-07-01 ENCOUNTER — Telehealth: Payer: Self-pay | Admitting: Adult Health

## 2019-07-01 NOTE — Telephone Encounter (Signed)
Advised pt states that she received robo call from [facility] that she understood to say that she was diabetic.  Pt very upset and concerned that she has never been told that she has diabetes.  Advised pt that she does NOT have diabetes and that the system is calling re:  Medication adherence for people with HTN, DM, or HLD and that this does not mean that she is diabetic.  Pt expressed understanding.  Charyl Bigger, CMA

## 2019-07-01 NOTE — Telephone Encounter (Signed)
Pt states pharmacy told her Losartin for Blood pressure & diabetes she was very upset (pt says she doesn't have diabetes and has never been told so she does so why was she prescribed medication for it--  Forwarding message to med asst to contact pt to clarify @ [phone]  --pt states won't be available till after 12noon today 11/17.  --glh

## 2019-07-04 ENCOUNTER — Other Ambulatory Visit: Payer: Medicare HMO

## 2019-07-04 ENCOUNTER — Other Ambulatory Visit: Payer: Self-pay

## 2019-07-04 DIAGNOSIS — E785 Hyperlipidemia, unspecified: Secondary | ICD-10-CM | POA: Diagnosis not present

## 2019-07-05 LAB — LIPID PANEL
Chol/HDL Ratio: 4.1 ratio (ref 0.0–4.4)
Cholesterol, Total: 181 mg/dL (ref 100–199)
HDL: 44 mg/dL (ref >39)
LDL Chol Calc (NIH): 117 mg/dL — ABNORMAL HIGH (ref 0–99)
Triglycerides: 111 mg/dL (ref 0–149)
VLDL Cholesterol Cal: 20 mg/dL (ref 5–40)

## 2019-08-14 ENCOUNTER — Ambulatory Visit
Admission: RE | Admit: 2019-08-14 | Discharge: 2019-08-14 | Disposition: A | Discharge status: Home / Self Care | Payer: Medicare HMO | Source: Ambulatory Visit | Attending: Adult Health | Admitting: Adult Health

## 2019-08-14 ENCOUNTER — Other Ambulatory Visit: Payer: Self-pay

## 2019-08-14 DIAGNOSIS — Z1231 Encounter for screening mammogram for malignant neoplasm of breast: Secondary | ICD-10-CM | POA: Diagnosis not present

## 2019-09-25 ENCOUNTER — Other Ambulatory Visit: Payer: Self-pay | Admitting: Adult Health

## 2019-09-25 ENCOUNTER — Telehealth: Payer: Self-pay

## 2019-09-25 NOTE — Telephone Encounter (Signed)
Please call pt to schedule appt.  No further refills until pt is seen.  T. Natara Monfort, CMA  

## 2019-10-17 ENCOUNTER — Other Ambulatory Visit: Payer: Self-pay | Admitting: Adult Health

## 2019-11-04 ENCOUNTER — Telehealth: Payer: Self-pay | Admitting: Adult Health

## 2019-11-04 NOTE — Telephone Encounter (Signed)
Patient called states she just purchase OTC supplement Biotin for hair growth & wanted to inform provider since it states on pill bottle to tell your doctor--- pt states is also taking a Liver cleanser OTC for detox.  --FYI to med asst to contact pt if there are any questions or concerns @ [phone].  -glh

## 2019-11-05 NOTE — Telephone Encounter (Signed)
Noted  

## 2019-11-19 ENCOUNTER — Other Ambulatory Visit: Payer: Self-pay | Admitting: Family Medicine

## 2019-11-27 ENCOUNTER — Other Ambulatory Visit: Payer: Self-pay | Admitting: Family Medicine

## 2019-12-29 ENCOUNTER — Ambulatory Visit (INDEPENDENT_AMBULATORY_CARE_PROVIDER_SITE_OTHER): Payer: Medicare HMO | Admitting: Physician Assistant

## 2019-12-29 ENCOUNTER — Encounter: Payer: Self-pay | Admitting: Physician Assistant

## 2019-12-29 ENCOUNTER — Other Ambulatory Visit: Payer: Self-pay

## 2019-12-29 VITALS — BP 118/75 | HR 65 | Temp 98.0°F | Ht 65.0 in | Wt 203.3 lb

## 2019-12-29 DIAGNOSIS — Z713 Dietary counseling and surveillance: Secondary | ICD-10-CM | POA: Diagnosis not present

## 2019-12-29 DIAGNOSIS — E785 Hyperlipidemia, unspecified: Secondary | ICD-10-CM | POA: Diagnosis not present

## 2019-12-29 DIAGNOSIS — E669 Obesity, unspecified: Secondary | ICD-10-CM | POA: Diagnosis not present

## 2019-12-29 DIAGNOSIS — I10 Essential (primary) hypertension: Secondary | ICD-10-CM

## 2019-12-29 MED ORDER — LOSARTAN POTASSIUM-HCTZ 50-12.5 MG PO TABS: 1.0000 | ORAL_TABLET | Freq: Every day | ORAL | 1 refills | Status: DC

## 2019-12-29 NOTE — Patient Instructions (Signed)

## 2019-12-29 NOTE — Progress Notes (Signed)
Established Patient Office Visit  Subjective:  Patient ID: Cindy Figueroa, female    DOB: 1950-12-17  Age: 69 y.o. MRN: [id number]  CC:  Chief Complaint  Patient presents with  . Hyperlipidemia  . Hypertension    HPI Cindy Figueroa presents for chronic follow-up on hypertension and hyperlipidemia.  HTN: Pt denies chest pain, palpitations, dizziness or lower extremity swelling. Taking medication as directed without side effects. Checks BP at home and readings range in 120s-130s/70s. Pt follows a low salt diet.  HLD: Last lipid panel was elevated and statin therapy was suggested, but patient has concerns about adverse side effects and would like to improve diet to help reduce elevated cholesterol before considering medication therapy. States she hasn't been as diligent with following a heart healthy diet. Pt is active- stationary bike 30 minutes/day 5-6 times/wk and has started doing yard work too.  Weight loss, BMI 33.83: Pt reports she lost 53 pounds previously by changing her diet and becoming vegetarian. She wants to lose more and plans to be more diligent with her diet and continue exercising.      Past Medical History:  Diagnosis Date  . Hypertension     History reviewed. No pertinent surgical history.  Family History  Problem Relation Age of Onset  . Hyperlipidemia Mother   . Hypertension Mother   . Cancer Father   . Emphysema Father     Social History   Socioeconomic History  . Marital status: Married    Spouse name: Not on file  . Number of children: Not on file  . Years of education: Not on file  . Highest education level: Not on file  Occupational History  . Not on file  Tobacco Use  . Smoking status: Never Smoker  . Smokeless tobacco: Never Used  Substance and Sexual Activity  . Alcohol use: No  . Drug use: No  . Sexual activity: Not on file  Other Topics Concern  . Not on file  Social History Narrative  . Not on file   Social Determinants  of Health   Financial Resource Strain:   . Difficulty of Paying Living Expenses:   Food Insecurity:   . Worried About Charity fundraiser in the Last Year:   . Arboriculturist in the Last Year:   Transportation Needs:   . Film/video editor (Medical):   Marland Kitchen Lack of Transportation (Non-Medical):   Physical Activity:   . Days of Exercise per Week:   . Minutes of Exercise per Session:   Stress:   . Feeling of Stress :   Social Connections:   . Frequency of Communication with Friends and Family:   . Frequency of Social Gatherings with Friends and Family:   . Attends Religious Services:   . Active Member of Clubs or Organizations:   . Attends Archivist Meetings:   Marland Kitchen Marital Status:   Intimate Partner Violence:   . Fear of Current or Ex-Partner:   . Emotionally Abused:   Marland Kitchen Physically Abused:   . Sexually Abused:     Outpatient Medications Prior to Visit  Medication Sig Dispense Refill  . Biotin 5000 MCG CAPS Take 1 capsule by mouth daily.    Marland Kitchen losartan-hydrochlorothiazide (HYZAAR) 50-12.5 MG tablet TAKE 1 TABLET BY MOUTH DAILY. **PATIENT NEEDS APT FOR FURTHER REFILLS** 15 tablet 0  . UNABLE TO FIND Take 1 capsule by mouth 3 (three) times daily. Med Name: Cardio Chelate with EDTA    . UNABLE  TO FIND Take 2 capsules by mouth daily. Med Name: Thomasena Edis     No facility-administered medications prior to visit.    Allergies  Allergen Reactions  . Penicillins   . Plavix [Clopidogrel Bisulfate] Hives    ROS Review of Systems:  A fourteen system review of systems was performed and found to be positive as per HPI.   Objective:    Physical Exam General: Well nourished, in no apparent distress. Eyes: PERRLA, EOMs, conjunctiva clr Resp: Respiratory effort- normal, ECTA B/L w/o W/R/R  Cardio: RRR w/o MRGs. Abdomen: no gross distention. Lymphatics:  less 2 sec cap RF M-sk: Full ROM, 5/5 strength, normal gait.  Skin: Warm, dry  Neuro: Alert, Oriented Psych: Normal  affect, Insight and Judgment appropriate.   BP 118/75   Pulse 65   Temp 98 F (36.7 C) (Oral)   Ht 5\' 5"  (1.651 m)   Wt 203 lb 4.8 oz (92.2 kg)   SpO2 99% Comment: on RA  BMI 33.83 kg/m  Wt Readings from Last 3 Encounters:  12/29/19 203 lb 4.8 oz (92.2 kg)  06/30/19 198 lb (89.8 kg)  01/09/19 200 lb (90.7 kg)     Health Maintenance Due  Topic Date Due  . Hepatitis C Screening  Never done  . COVID-19 Vaccine (1) Never done  . DEXA SCAN  Never done  . PNA vac Low Risk Adult (1 of 2 - PCV13) Never done    There are no preventive care reminders to display for this patient.  Lab Results  Component Value Date   TSH 1.050 12/31/2018   Lab Results  Component Value Date   WBC 7.2 01/09/2019   HGB 14.3 01/09/2019   HCT 42.8 01/09/2019   MCV 86 01/09/2019   PLT 314 01/09/2019   Lab Results  Component Value Date   NA 142 12/31/2018   K 4.6 12/31/2018   CO2 25 12/31/2018   GLUCOSE 98 12/31/2018   BUN 7 (L) 12/31/2018   CREATININE 0.57 12/31/2018   BILITOT 0.7 12/31/2018   ALKPHOS 59 12/31/2018   AST 14 12/31/2018   ALT 14 12/31/2018   PROT 6.0 12/31/2018   ALBUMIN 4.4 12/31/2018   CALCIUM 9.8 12/31/2018   Lab Results  Component Value Date   CHOL 181 07/04/2019   Lab Results  Component Value Date   HDL 44 07/04/2019   Lab Results  Component Value Date   LDLCALC 117 (H) 07/04/2019   Lab Results  Component Value Date   TRIG 111 07/04/2019   Lab Results  Component Value Date   CHOLHDL 4.1 07/04/2019   Lab Results  Component Value Date   HGBA1C 5.3 12/31/2018      Assessment & Plan:   Problem List Items Addressed This Visit      Cardiovascular and Mediastinum   HTN (hypertension) - Primary     Other   Obesity with body mass index of 30.0-39.9   Hyperlipidemia     HTN: - BP today is 118/75, at goal. - Continue Hyzaar 50-12.5 mg - Continue ambulatory BP and pulse monitoring - Encourage to continue DASH diet and physical activity. - Will  check CMP at next OV to monitor medication therapy.  HLD: - Last lipid panel elevated and patient motivated to improve and be more diligent with reducing cholesterol/sat and trans fat.  - Encourage to continue stationary bike and outside work - Will continue to monitor and plan to recheck lipid panel at next OV  Weight loss, BMI  33.83: - Pt plans to lose more weight with ultimate wt goal of 170-175 pounds. - Encourage to continue dietary and lifestyle changes. - Will continue to monitor.   No orders of the defined types were placed in this encounter.   Follow-up: No follow-ups on file.    Lorrene Reid, PA-C

## 2020-01-08 ENCOUNTER — Other Ambulatory Visit: Payer: Medicare HMO

## 2020-02-10 DIAGNOSIS — D225 Melanocytic nevi of trunk: Secondary | ICD-10-CM | POA: Diagnosis not present

## 2020-02-10 DIAGNOSIS — D485 Neoplasm of uncertain behavior of skin: Secondary | ICD-10-CM | POA: Diagnosis not present

## 2020-02-10 DIAGNOSIS — D1801 Hemangioma of skin and subcutaneous tissue: Secondary | ICD-10-CM | POA: Diagnosis not present

## 2020-02-10 DIAGNOSIS — L821 Other seborrheic keratosis: Secondary | ICD-10-CM | POA: Diagnosis not present

## 2020-03-12 ENCOUNTER — Other Ambulatory Visit: Payer: Self-pay

## 2020-03-12 ENCOUNTER — Ambulatory Visit (INDEPENDENT_AMBULATORY_CARE_PROVIDER_SITE_OTHER): Payer: Medicare HMO

## 2020-03-12 ENCOUNTER — Encounter: Payer: Self-pay | Admitting: Surgical

## 2020-03-12 ENCOUNTER — Ambulatory Visit: Payer: Medicare HMO | Admitting: Surgical

## 2020-03-12 VITALS — Ht 66.0 in | Wt 192.0 lb

## 2020-03-12 DIAGNOSIS — M79605 Pain in left leg: Secondary | ICD-10-CM

## 2020-03-12 DIAGNOSIS — M79671 Pain in right foot: Secondary | ICD-10-CM

## 2020-03-12 DIAGNOSIS — M25511 Pain in right shoulder: Secondary | ICD-10-CM

## 2020-03-12 DIAGNOSIS — M7541 Impingement syndrome of right shoulder: Secondary | ICD-10-CM | POA: Diagnosis not present

## 2020-03-12 DIAGNOSIS — M19079 Primary osteoarthritis, unspecified ankle and foot: Secondary | ICD-10-CM | POA: Diagnosis not present

## 2020-03-12 NOTE — Progress Notes (Addendum)
Office Visit Note   Patient: Cindy Figueroa           Date of Birth: [birth year]           MRN: [id number] Visit Date: 03/12/2020 Requested by: Lorrene Reid, PA-C [address] [place],  [place] [id number] PCP: Lorrene Reid, PA-C  Subjective: Chief Complaint  Patient presents with  . Right Shoulder - Pain  . Left Leg - Pain    DOI 01/12/2020  . Right Foot - Pain    HPI: Cindy Figueroa is a 69 y.o. female who presents to the office complaining of right shoulder pain.  She has history of right shoulder greater tuberosity avulsion fracture that was treated nonoperatively.  She was last seen in July 2020 and was doing well with radiographic evidence of fracture healing.  She notes increased pain over the last 5 weeks in the right shoulder.  Denies any injury leading to onset of right shoulder pain.  He localizes pain to the lateral shoulder and midshaft humerus.  She denies any neck pain, numbness/tingling, weakness.  Pain is worse when she is reaching behind her or out away from her body.  She has no history of diabetes or thyroid disorders.  She also complains of left anterior shin pain since May 31 when she was walking with a shopping cart and the cart locked up and her shin impacted onto the back of the shopping cart.  She has she had significant swelling at the time of injury but she was able to weight-bear and has been able to weight-bear ever since.  Swelling has significantly improved as well as pain.  She states she "just wants to make sure it is not broken".  She also complains of right foot pain that she localizes to an area of swelling on the dorsal midfoot of the right foot.  She denies any injury been onset of pain.  Pain is been bothering her for 2 months.  Is worse with tight shoes.  She has not tried taking anything for this pain.  She is not using anything topically..                ROS: All systems reviewed are negative as they relate to the chief complaint  within the history of present illness.  Patient denies fevers or chills.  Assessment & Plan: Visit Diagnoses:  1. Acute pain of right shoulder   2. Pain in left leg   3. Pain in right foot   4. Impingement syndrome of right shoulder   5. Arthritis of midfoot     Plan: Patient is a 69 year old female presents complaining of right shoulder, left leg, right foot pain.  She has history of right shoulder greater tuberosity fracture.  Radiographs taken today reveal that this is well-healed.  She has excellent range of motion today on exam and good strength of the rotator cuff.  With 5 weeks of symptoms, and without an injury, difficult to say for sure what is bothering her with the right shoulder.  She does have impingement signs on exam.  Impression is bursitis versus early frozen shoulder.  As for the left anterior shin pain, her pain has significantly improved since the injury and continues to improve.  Radiographs taken today are negative for any fracture or defect in the anterior tibia.  Right foot radiographs taken today reveal dorsal midfoot bone spur that correlates with her physical exam findings and tenderness.  Recommended she use Voltaren gel topically  on her right foot and she may use it on her left leg as needed as well.  As for the right shoulder, a subacromial injection was administered today and patient tolerated the procedure well.  Plan for patient to follow-up in 6 weeks for clinical recheck.  Patient agreed with this plan.  Follow-Up Instructions: No follow-ups on file.   Orders:  Orders Placed This Encounter  Procedures  . XR Tibia/Fibula Left  . XR Shoulder Right  . XR Foot Complete Right   No orders of the defined types were placed in this encounter.     Procedures: Large Joint Inj: R subacromial bursa on 03/13/2020 12:07 PM Indications: diagnostic evaluation and pain Details: 18 G 1.5 in needle, posterior approach  Arthrogram: No  Medications: 9 mL bupivacaine 0.5  %; 40 mg methylPREDNISolone acetate 40 MG/ML; 5 mL lidocaine 1 % Outcome: tolerated well, no immediate complications Procedure, treatment alternatives, risks and benefits explained, specific risks discussed. Consent was given by the patient. Immediately prior to procedure a time out was called to verify the correct patient, procedure, equipment, support staff and site/side marked as required. Patient was prepped and draped in the usual sterile fashion.       Clinical Data: No additional findings.  Objective: Vital Signs: Ht 5\' 6"  (1.676 m)   Wt 192 lb (87.1 kg)   BMI 30.99 kg/m   Physical Exam:  Constitutional: Patient appears well-developed HEENT:  Head: Normocephalic Eyes:EOM are normal Neck: Normal range of motion Cardiovascular: Normal rate Pulmonary/chest: Effort normal Neurologic: Patient is alert Skin: Skin is warm Psychiatric: Patient has normal mood and affect  Ortho Exam: Orthopedic exam demonstrates right shoulder with well-preserved range of motion that is equivalent to the contralateral side.  No weakness with supraspinatus, infraspinatus, subscapularis.  Positive Neer's impingement sign.  Tenderness over the bicipital groove.  No crepitus is felt with passive range of motion shoulder.  No tenderness palpation over the [facility] joint.  No tenderness palpation throughout the axial cervical spine.  5/5 motor strength of the bilateral grip, finger abduction, pronation/supination, bicep, tricep.  Tender to palpation of the anterior shin.  No significant tenderness palpation throughout the left knee.  No knee effusion of the left knee.  No significant swelling or defect felt in the anterior tibia.  Right foot with swelling and palpable bone spur in the dorsal midfoot.  This is her area of maximal tenderness.  No significant tenderness elsewhere throughout the right foot.  Specialty Comments:  No specialty comments available.  Imaging: No results found.   PMFS History: Patient  Active Problem List   Diagnosis Date Noted  . Open wound of left lower leg 01/09/2019  . Pain in right upper arm 12/31/2018  . PTSD (post-traumatic stress disorder) 10/21/2018  . Anxiety in acute stress reaction 10/21/2018  . Healthcare maintenance 06/06/2018  . Screening for breast cancer 06/06/2018  . Encounter for Medicare annual wellness exam 12/06/2017  . Obesity with body mass index of 30.0-39.9 03/13/2017  . Hyperlipidemia 03/13/2017  . Vitamin D deficiency 01/11/2017  . HTN (hypertension) 12/26/2016  . Poor balance 12/26/2016  . H/O fatigue 12/26/2016  . Family history of high cholesterol 12/26/2016   Past Medical History:  Diagnosis Date  . Hypertension     Family History  Problem Relation Age of Onset  . Hyperlipidemia Mother   . Hypertension Mother   . Cancer Father   . Emphysema Father     No past surgical history on file. Social History  Occupational History  . Not on file  Tobacco Use  . Smoking status: Never Smoker  . Smokeless tobacco: Never Used  Vaping Use  . Vaping Use: Never used  Substance and Sexual Activity  . Alcohol use: No  . Drug use: No  . Sexual activity: Not on file

## 2020-03-13 ENCOUNTER — Encounter: Payer: Self-pay | Admitting: Surgical

## 2020-03-13 DIAGNOSIS — M7541 Impingement syndrome of right shoulder: Secondary | ICD-10-CM

## 2020-03-13 MED ORDER — LIDOCAINE HCL 1 % IJ SOLN
5.0000 mL | INTRAMUSCULAR | Status: AC | PRN
  Administered 2020-03-13: 5 mL

## 2020-03-13 MED ORDER — BUPIVACAINE HCL 0.5 % IJ SOLN
9.0000 mL | INTRAMUSCULAR | Status: AC | PRN
  Administered 2020-03-13: 9 mL via INTRA_ARTICULAR

## 2020-03-13 MED ORDER — METHYLPREDNISOLONE ACETATE 40 MG/ML IJ SUSP
40.0000 mg | INTRAMUSCULAR | Status: AC | PRN
  Administered 2020-03-13: 40 mg via INTRA_ARTICULAR

## 2020-04-23 ENCOUNTER — Ambulatory Visit: Payer: Medicare HMO | Admitting: Surgical

## 2020-05-07 ENCOUNTER — Ambulatory Visit: Payer: Medicare HMO | Admitting: Surgical

## 2020-05-12 ENCOUNTER — Ambulatory Visit (INDEPENDENT_AMBULATORY_CARE_PROVIDER_SITE_OTHER): Payer: Medicare HMO | Admitting: Orthopedic Surgery

## 2020-05-12 DIAGNOSIS — M7541 Impingement syndrome of right shoulder: Secondary | ICD-10-CM

## 2020-05-16 ENCOUNTER — Encounter: Payer: Self-pay | Admitting: Orthopedic Surgery

## 2020-05-16 NOTE — Progress Notes (Signed)
Office Visit Note   Patient: Cindy Figueroa           Date of Birth: 1950/11/05           MRN: [id number] Visit Date: 05/12/2020 Requested by: Lorrene Reid, PA-C [place],  [place] [address] PCP: Lorrene Reid, PA-C  Subjective: Chief Complaint  Patient presents with  . Follow-up    HPI: Cindy Figueroa is a 69 year old patient with right foot pain and right shoulder pain.  Had a prior injection which did give some relief.  Has some dorsal spurring of the foot consistent with known diagnosis of mild midfoot arthritis.              ROS: All systems reviewed are negative as they relate to the chief complaint within the history of present illness.  Patient denies  fevers or chills.   Assessment & Plan: Visit Diagnoses:  1. Impingement syndrome of right shoulder     Plan: Impression is right foot pain with dorsal spurring with no real intervention required at this time.  Hard to say exactly which joint would be most likely to give her relief if injected but she wants to hold off on that for now.  Wearing shoes that have good arch supports important to prevent dorsal pinching with ambulation.  Regarding the right shoulder she still has some aching from shoulder to elbow but the injection to give her relief.  Her range of motion is improving.  She wants to continue to work on that without a further injection at this time.  Follow-up as needed  Follow-Up Instructions: Return if symptoms worsen or fail to improve.   Orders:  No orders of the defined types were placed in this encounter.  No orders of the defined types were placed in this encounter.     Procedures: No procedures performed   Clinical Data: No additional findings.  Objective: Vital Signs: There were no vitals taken for this visit.  Physical Exam:   Constitutional: Patient appears well-developed HEENT:  Head: Normocephalic Eyes:EOM are normal Neck: Normal range of  motion Cardiovascular: Normal rate Pulmonary/chest: Effort normal Neurologic: Patient is alert Skin: Skin is warm Psychiatric: Patient has normal mood and affect    Ortho Exam: Ortho exam demonstrates full active and passive range of motion in terms of ankle dorsiflexion plantarflexion inversion eversion on the right foot.  Palpable intact nontender anterior to posterior to peroneal Achilles tendons.  No pain with pronation supination of the forefoot.  No masses lymphadenopathy or skin changes noted in the foot region.  Right shoulder demonstrates external rotation 60 on the left 45 on the right.  Forward flexion 180 on left 150 on the right.  Rotator cuff strength is good.  No coarse grinding or crepitus with passive range of motion of the right shoulder.  Specialty Comments:  No specialty comments available.  Imaging: No results found.   PMFS History: Patient Active Problem List   Diagnosis Date Noted  . Open wound of left lower leg 01/09/2019  . Pain in right upper arm 12/31/2018  . PTSD (post-traumatic stress disorder) 10/21/2018  . Anxiety in acute stress reaction 10/21/2018  . Healthcare maintenance 06/06/2018  . Screening for breast cancer 06/06/2018  . Encounter for Medicare annual wellness exam 12/06/2017  . Obesity with body mass index of 30.0-39.9 03/13/2017  . Hyperlipidemia 03/13/2017  . Vitamin D deficiency 01/11/2017  . HTN (hypertension) 12/26/2016  . Poor balance 12/26/2016  . H/O fatigue  12/26/2016  . Family history of high cholesterol 12/26/2016   Past Medical History:  Diagnosis Date  . Hypertension     Family History  Problem Relation Age of Onset  . Hyperlipidemia Mother   . Hypertension Mother   . Cancer Father   . Emphysema Father     History reviewed. No pertinent surgical history. Social History   Occupational History  . Not on file  Tobacco Use  . Smoking status: Never Smoker  . Smokeless tobacco: Never Used  Vaping Use  . Vaping Use:  Never used  Substance and Sexual Activity  . Alcohol use: No  . Drug use: No  . Sexual activity: Not on file

## 2020-05-25 DIAGNOSIS — D225 Melanocytic nevi of trunk: Secondary | ICD-10-CM | POA: Diagnosis not present

## 2020-05-25 DIAGNOSIS — D485 Neoplasm of uncertain behavior of skin: Secondary | ICD-10-CM | POA: Diagnosis not present

## 2020-05-25 DIAGNOSIS — D2262 Melanocytic nevi of left upper limb, including shoulder: Secondary | ICD-10-CM | POA: Diagnosis not present

## 2020-05-25 DIAGNOSIS — Z1283 Encounter for screening for malignant neoplasm of skin: Secondary | ICD-10-CM | POA: Diagnosis not present

## 2020-06-04 ENCOUNTER — Other Ambulatory Visit: Payer: Self-pay | Admitting: Physician Assistant

## 2020-06-04 DIAGNOSIS — I10 Essential (primary) hypertension: Secondary | ICD-10-CM

## 2020-06-15 ENCOUNTER — Other Ambulatory Visit: Payer: Self-pay

## 2020-06-15 ENCOUNTER — Emergency Department (HOSPITAL_COMMUNITY)
Admission: EM | Admit: 2020-06-15 | Discharge: 2020-06-15 | Disposition: A | Discharge status: Home / Self Care | Payer: Medicare HMO | Attending: Emergency Medicine | Admitting: Emergency Medicine

## 2020-06-15 ENCOUNTER — Encounter (HOSPITAL_COMMUNITY): Payer: Self-pay | Admitting: Emergency Medicine

## 2020-06-15 ENCOUNTER — Emergency Department (HOSPITAL_COMMUNITY): Payer: Medicare HMO

## 2020-06-15 DIAGNOSIS — Z853 Personal history of malignant neoplasm of breast: Secondary | ICD-10-CM | POA: Insufficient documentation

## 2020-06-15 DIAGNOSIS — W010XXA Fall on same level from slipping, tripping and stumbling without subsequent striking against object, initial encounter: Secondary | ICD-10-CM | POA: Diagnosis not present

## 2020-06-15 DIAGNOSIS — Z79899 Other long term (current) drug therapy: Secondary | ICD-10-CM | POA: Insufficient documentation

## 2020-06-15 DIAGNOSIS — I10 Essential (primary) hypertension: Secondary | ICD-10-CM | POA: Insufficient documentation

## 2020-06-15 DIAGNOSIS — S4992XA Unspecified injury of left shoulder and upper arm, initial encounter: Secondary | ICD-10-CM | POA: Diagnosis present

## 2020-06-15 DIAGNOSIS — S42292A Other displaced fracture of upper end of left humerus, initial encounter for closed fracture: Secondary | ICD-10-CM | POA: Diagnosis not present

## 2020-06-15 DIAGNOSIS — S42252A Displaced fracture of greater tuberosity of left humerus, initial encounter for closed fracture: Secondary | ICD-10-CM | POA: Insufficient documentation

## 2020-06-15 DIAGNOSIS — W19XXXA Unspecified fall, initial encounter: Secondary | ICD-10-CM

## 2020-06-15 DIAGNOSIS — S42202A Unspecified fracture of upper end of left humerus, initial encounter for closed fracture: Secondary | ICD-10-CM | POA: Diagnosis not present

## 2020-06-15 DIAGNOSIS — Y9301 Activity, walking, marching and hiking: Secondary | ICD-10-CM | POA: Diagnosis not present

## 2020-06-15 DIAGNOSIS — S80211A Abrasion, right knee, initial encounter: Secondary | ICD-10-CM | POA: Insufficient documentation

## 2020-06-15 DIAGNOSIS — S42212A Unspecified displaced fracture of surgical neck of left humerus, initial encounter for closed fracture: Secondary | ICD-10-CM | POA: Diagnosis not present

## 2020-06-15 MED ORDER — DICLOFENAC SODIUM 1 % EX GEL: 4.0000 g | Freq: Four times a day (QID) | CUTANEOUS | 0 refills | Status: DC

## 2020-06-15 MED ORDER — MORPHINE SULFATE 15 MG PO TABS
7.5000 mg | ORAL_TABLET | ORAL | 0 refills | Status: DC | PRN
Start: 2020-06-15 — End: 2020-12-17

## 2020-06-15 MED ORDER — ACETAMINOPHEN 500 MG PO TABS
1000.0000 mg | ORAL_TABLET | Freq: Once | ORAL | Status: AC
  Administered 2020-06-15: 1000 mg via ORAL
  Filled 2020-06-15: qty 2

## 2020-06-15 MED ORDER — OXYCODONE HCL 5 MG PO TABS
2.5000 mg | ORAL_TABLET | Freq: Once | ORAL | Status: AC
  Administered 2020-06-15: 2.5 mg via ORAL
  Filled 2020-06-15: qty 1

## 2020-06-15 MED ORDER — KETOROLAC TROMETHAMINE 60 MG/2ML IM SOLN
15.0000 mg | Freq: Once | INTRAMUSCULAR | Status: AC
  Administered 2020-06-15: 15 mg via INTRAMUSCULAR
  Filled 2020-06-15: qty 2

## 2020-06-15 NOTE — Progress Notes (Signed)
Orthopedic Tech Progress Note Patient Details:  Cindy Figueroa Dec 24, 1950 [id number] Applied arm sling Ortho Devices Type of Ortho Device: Arm sling Ortho Device/Splint Location: LUE Ortho Device/Splint Interventions: Ordered, Application   Post Interventions Patient Tolerated: Well Instructions Provided: Adjustment of device   Petra Kuba 06/15/2020, 2:59 AM

## 2020-06-15 NOTE — ED Provider Notes (Signed)
[facility] EMERGENCY DEPARTMENT Provider Note   CSN: [id number] Arrival date & time: 06/15/20  0105     History Chief Complaint  Patient presents with  . Fall    Cindy Figueroa is a 69 y.o. female.  69 yo F with a chief complaint of left shoulder pain.  The patient was walking and tripped over her dog and landed on her left shoulder.  Has some mild pain to the right knee but does not think it is that bad.  Denies head injury loss of consciousness neck pain chest pain abdominal pain.  Denies pain to the elbow or the wrist or the hand.  She has broken her right shoulder before.  Has seen Dr. Marlou Sa in the past.-  The history is provided by the patient.  Fall This is a new problem. The current episode started 1 to 2 hours ago. The problem occurs rarely. The problem has been resolved. Pertinent negatives include no chest pain, no headaches and no shortness of breath. The symptoms are aggravated by bending and twisting. Nothing relieves the symptoms. She has tried nothing for the symptoms.       Past Medical History:  Diagnosis Date  . Hypertension     Patient Active Problem List   Diagnosis Date Noted  . Open wound of left lower leg 01/09/2019  . Pain in right upper arm 12/31/2018  . PTSD (post-traumatic stress disorder) 10/21/2018  . Anxiety in acute stress reaction 10/21/2018  . Healthcare maintenance 06/06/2018  . Screening for breast cancer 06/06/2018  . Encounter for Medicare annual wellness exam 12/06/2017  . Obesity with body mass index of 30.0-39.9 03/13/2017  . Hyperlipidemia 03/13/2017  . Vitamin D deficiency 01/11/2017  . HTN (hypertension) 12/26/2016  . Poor balance 12/26/2016  . H/O fatigue 12/26/2016  . Family history of high cholesterol 12/26/2016    History reviewed. No pertinent surgical history.   OB History   No obstetric history on file.     Family History  Problem Relation Age of Onset  . Hyperlipidemia Mother   .  Hypertension Mother   . Cancer Father   . Emphysema Father     Social History   Tobacco Use  . Smoking status: Never Smoker  . Smokeless tobacco: Never Used  Vaping Use  . Vaping Use: Never used  Substance Use Topics  . Alcohol use: No  . Drug use: No    Home Medications Prior to Admission medications   Medication Sig Start Date End Date Taking? Authorizing Provider  Biotin 5000 MCG CAPS Take 1 capsule by mouth daily.    [provider]  diclofenac Sodium (VOLTAREN) 1 % GEL Apply 4 g topically 4 (four) times daily. 06/15/20   Deno Etienne, DO  losartan-hydrochlorothiazide (HYZAAR) 50-12.5 MG tablet TAKE 1 TABLET BY MOUTH EVERY DAY 06/07/20   Lorrene Reid, PA-C  morphine (MSIR) 15 MG tablet Take 0.5 tablets (7.5 mg total) by mouth every 4 (four) hours as needed for severe pain. 06/15/20   Deno Etienne, DO  UNABLE TO FIND Take 1 capsule by mouth 3 (three) times daily. Med Name: Cardio Chelate with EDTA    [provider]    Allergies    Penicillins and Plavix [clopidogrel bisulfate]  Review of Systems   Review of Systems  Constitutional: Negative for chills and fever.  HENT: Negative for congestion and rhinorrhea.   Eyes: Negative for redness and visual disturbance.  Respiratory: Negative for shortness of breath and wheezing.  Cardiovascular: Negative for chest pain and palpitations.  Gastrointestinal: Negative for nausea and vomiting.  Genitourinary: Negative for dysuria and urgency.  Musculoskeletal: Positive for arthralgias and myalgias.  Skin: Negative for pallor and wound.  Neurological: Negative for dizziness and headaches.    Physical Exam Updated Vital Signs BP (!) 142/73 (BP Location: Right Arm)   Pulse 84   Temp 98.2 F (36.8 C)   Resp 20   SpO2 96%   Physical Exam Vitals and nursing note reviewed.  Constitutional:      General: She is not in acute distress.    Appearance: She is well-developed. She is not diaphoretic.  HENT:      Head: Normocephalic and atraumatic.  Eyes:     Pupils: Pupils are equal, round, and reactive to light.  Cardiovascular:     Rate and Rhythm: Normal rate and regular rhythm.     Heart sounds: No murmur heard.  No friction rub. No gallop.   Pulmonary:     Effort: Pulmonary effort is normal.     Breath sounds: No wheezing or rales.  Abdominal:     General: There is no distension.     Palpations: Abdomen is soft.     Tenderness: There is no abdominal tenderness.  Musculoskeletal:        General: Tenderness present.     Cervical back: Normal range of motion and neck supple.     Comments: Tenderness with palpation along the proximal humerus.  Pulse motor and sensation intact distally.  No pain along the clavicle or the scapula.  Full range of motion at the elbow.  Able to supinate and pronate at the forearm without pain.  Patient has a superficial abrasion to the right knee.  Full range of motion.  No effusion.  No ligamentous laxity.  Palpated from head to toe without any other noted areas of bony tenderness.  Skin:    General: Skin is warm and dry.  Neurological:     Mental Status: She is alert and oriented to person, place, and time.  Psychiatric:        Behavior: Behavior normal.     ED Results / Procedures / Treatments   Labs (all labs ordered are listed, but only abnormal results are displayed) Labs Reviewed - No data to display  EKG None  Radiology DG Knee Complete 4 Views Right  Result Date: 06/15/2020 CLINICAL DATA:  Fall, right knee abrasions EXAM: RIGHT KNEE - COMPLETE 4+ VIEW COMPARISON:  None. FINDINGS: No evidence of fracture, dislocation, or joint effusion. No evidence of arthropathy or other focal bone abnormality. Soft tissues are unremarkable. IMPRESSION: Negative. Electronically Signed   By: Rolm Baptise M.D.   On: 06/15/2020 01:59   DG Humerus Left  Result Date: 06/15/2020 CLINICAL DATA:  Fall.  Left upper arm deformity EXAM: LEFT HUMERUS - 2+ VIEW COMPARISON:   None. FINDINGS: There is a comminuted fracture through the left humeral neck and greater tuberosity. Mild displacement of fracture fragments. No subluxation or dislocation. IMPRESSION: Comminuted, displaced left humeral neck fracture and fracture through the greater tuberosity. Electronically Signed   By: Rolm Baptise M.D.   On: 06/15/2020 01:58    Procedures Procedures (including critical care time)  Medications Ordered in ED Medications  ketorolac (TORADOL) injection 15 mg (has no administration in time range)  acetaminophen (TYLENOL) tablet 1,000 mg (1,000 mg Oral Given 06/15/20 0223)  oxyCODONE (Oxy IR/ROXICODONE) immediate release tablet 2.5 mg (2.5 mg Oral Given 06/15/20 0223)  ED Course  I have reviewed the triage vital signs and the nursing notes.  Pertinent labs & imaging results that were available during my care of the patient were reviewed by me and considered in my medical decision making (see chart for details).    MDM Rules/Calculators/A&P                          69 yo F with a chief complaints of a fall.  Nonsyncopal by history.  Complaining of left shoulder pain.  Plain film of left shoulder viewed by me with a proximal humerus fracture.  Will place in a sling.  Orthopedic follow-up.  2:48 AM:  I have discussed the diagnosis/risks/treatment options with the patient and family and believe the pt to be eligible for discharge home to follow-up with Ortho. We also discussed returning to the ED immediately if new or worsening sx occur. We discussed the sx which are most concerning (e.g., sudden worsening pain, fever, inability to tolerate by mouth) that necessitate immediate return. Medications administered to the patient during their visit and any new prescriptions provided to the patient are listed below.  Medications given during this visit Medications  ketorolac (TORADOL) injection 15 mg (has no administration in time range)  acetaminophen (TYLENOL) tablet 1,000 mg  (1,000 mg Oral Given 06/15/20 0223)  oxyCODONE (Oxy IR/ROXICODONE) immediate release tablet 2.5 mg (2.5 mg Oral Given 06/15/20 0223)     The patient appears reasonably screen and/or stabilized for discharge and I doubt any other medical condition or other [facility] requiring further screening, evaluation, or treatment in the ED at this time prior to discharge.   Final Clinical Impression(s) / ED Diagnoses Final diagnoses:  Fall, initial encounter  Closed fracture of proximal end of left humerus, unspecified fracture morphology, initial encounter    Rx / DC Orders ED Discharge Orders         Ordered    morphine (MSIR) 15 MG tablet  Every 4 hours PRN        06/15/20 0237    diclofenac Sodium (VOLTAREN) 1 % GEL  4 times daily        06/15/20 [place], [place], DO 06/15/20 [id number]

## 2020-06-15 NOTE — ED Triage Notes (Signed)
Pt reports tripping over dog @ 0015, abrasions noted to R knee, deformity noted to Left upper arm. Pt tearful. +CMS

## 2020-06-15 NOTE — ED Notes (Signed)
Discharge instructions discussed with pt. Pt verbalized understanding. Pt stable and ambulatory. Nos signature pad available

## 2020-06-15 NOTE — Discharge Instructions (Addendum)
Take tylenol 1000mg (2 extra strength) four times a day. Use the gel as prescribed.  Then take the pain medicine if you feel like you need it. Narcotics do not help with the pain, they only make you care about it less.  You can become addicted to this, people may break into your house to steal it.  It will constipate you.  If you drive under the influence of this medicine you can get a DUI.    Return for worsening pain, numbness or tingling.  You should take your arm out of the sling at least 4 times a day and perform circles with your arm down in gravity.

## 2020-06-16 ENCOUNTER — Other Ambulatory Visit: Payer: Self-pay

## 2020-06-16 ENCOUNTER — Ambulatory Visit (INDEPENDENT_AMBULATORY_CARE_PROVIDER_SITE_OTHER): Payer: Medicare HMO | Admitting: Orthopedic Surgery

## 2020-06-16 DIAGNOSIS — S42202A Unspecified fracture of upper end of left humerus, initial encounter for closed fracture: Secondary | ICD-10-CM | POA: Diagnosis not present

## 2020-06-16 DIAGNOSIS — G8929 Other chronic pain: Secondary | ICD-10-CM | POA: Diagnosis not present

## 2020-06-17 ENCOUNTER — Telehealth: Payer: Self-pay | Admitting: Orthopedic Surgery

## 2020-06-17 NOTE — Telephone Encounter (Signed)
Patient called requesting a refill of tylenol 3. Please send to pharmacy on file. Patient phone number is [phone].

## 2020-06-17 NOTE — Telephone Encounter (Signed)
Please advise. Thanks.  

## 2020-06-18 ENCOUNTER — Other Ambulatory Visit: Payer: Self-pay | Admitting: Surgical

## 2020-06-18 MED ORDER — ACETAMINOPHEN-CODEINE #3 300-30 MG PO TABS
1.0000 | ORAL_TABLET | Freq: Three times a day (TID) | ORAL | 0 refills | Status: DC | PRN
Start: 2020-06-18 — End: 2020-06-30

## 2020-06-18 NOTE — Progress Notes (Signed)
Office Visit Note   Patient: Cindy Figueroa           Date of Birth: 01-24-1951           MRN: [id number] Visit Date: 06/16/2020 Requested by: Lorrene Reid, PA-C [address] [place],  [place] [address] PCP: Lorrene Reid, PA-C  Subjective: Chief Complaint  Patient presents with  . Left Shoulder - Injury    HPI: [name] presenting to clinic with concerns of left proximal humerus fracture. Patient states that she was stepping over her dog yesterday, then the dog suddenly stood up and knocked her off balance. She says she landed hard on her left side and felt immediate, severe pain radiating through her left arm and shoulder. She presented to the ED, shere XRays demonstrated a promixal humerus fracture. Patient was placed in a shoulder splint, and states she was instructed to do pendulum ROM exercises 'to prevent blood clots,' and has been trying to do this three times daily. She says her pain is significant, and has not been controlled with tylenol alone. She is scared to fill the morphine that was written for her by the ED, and is curious about trying Tylenol 3. She says she has been sleeping in an airchair to try to get through the night, which is very uncomfortable. She denies any numbness or tingling in the arm/hand.               ROS:   All other systems were reviewed and are negative.  Objective: Vital Signs: There were no vitals taken for this visit.  Physical Exam:  General:  Alert and oriented, in no acute distress. Pulm:  Breathing unlabored. Psy:  Normal mood, congruent affect. Skin:  Significant bruising extending across left upper arm anteriomedial aspect. Overlying skin intact, with no rashes or lesions.   Left shoulder: Arm held in shoulder sling. Brisk capillary refill in all fingers, and sensation is intact. Elbow with full ROM.   Imaging: CLINICAL DATA:  Fall.  Left upper arm deformity  EXAM: LEFT HUMERUS - 2+ VIEW  COMPARISON:   None.  FINDINGS: There is a comminuted fracture through the left humeral neck and greater tuberosity. Mild displacement of fracture fragments. No subluxation or dislocation.  IMPRESSION: Comminuted, displaced left humeral neck fracture and fracture through the greater tuberosity.   Electronically Signed   By: Rolm Baptise M.D.   On: 06/15/2020 01:58  Assessment & Plan: [name] presenting to clinic today to follow up on Left comminuted, displaced humeral neck fracture. Currently, humerus appears relatively well aligned on recent imaging, and the arm is neurovascularly intact.  - Patient was instructed to remain in sling continuously for three weeks to allow initial phases of healing. Given the fragmentation of this injury, there is a high risk for further displacement that would require surgical fixation vs reverse shoulder replacement. She should NOT perform pendulum exercises at this time.  - Will provide Tylenol 3 for better pain control.  - Return to clinic in one week for repeated imaging to assure alignment is maintained.  - Patient expresses understanding, and has no further questions or concerns at this time.  Patient did have right nondisplaced proximal humerus fracture a year ago and did well with that.  This proximal humerus fracture will be a different animal.  I think it is possible that she may require intervention but from a literature standpoint she should do well with nonoperative treatment.  Do anticipate that with nonsurgical  or surgical options pursued she will lose motion and function to some degree.     Procedures: No procedures performed  No notes on file     PMFS History: Patient Active Problem List   Diagnosis Date Noted  . Open wound of left lower leg 01/09/2019  . Pain in right upper arm 12/31/2018  . PTSD (post-traumatic stress disorder) 10/21/2018  . Anxiety in acute stress reaction 10/21/2018  . Healthcare maintenance 06/06/2018  . Screening  for breast cancer 06/06/2018  . Encounter for Medicare annual wellness exam 12/06/2017  . Obesity with body mass index of 30.0-39.9 03/13/2017  . Hyperlipidemia 03/13/2017  . Vitamin D deficiency 01/11/2017  . HTN (hypertension) 12/26/2016  . Poor balance 12/26/2016  . H/O fatigue 12/26/2016  . Family history of high cholesterol 12/26/2016   Past Medical History:  Diagnosis Date  . Hypertension     Family History  Problem Relation Age of Onset  . Hyperlipidemia Mother   . Hypertension Mother   . Cancer Father   . Emphysema Father     No past surgical history on file. Social History   Occupational History  . Not on file  Tobacco Use  . Smoking status: Never Smoker  . Smokeless tobacco: Never Used  Vaping Use  . Vaping Use: Never used  Substance and Sexual Activity  . Alcohol use: No  . Drug use: No  . Sexual activity: Not on file

## 2020-06-18 NOTE — Telephone Encounter (Signed)
submitted

## 2020-06-18 NOTE — Telephone Encounter (Signed)
I called patient and advised. She states that she wished we would have sent this in on Wednesday or Thursday because now her arm is really hurting. I explained that I was sorry, however, the medication should be at her pharmacy now. She expressed understanding.

## 2020-06-21 ENCOUNTER — Encounter: Payer: Self-pay | Admitting: Orthopedic Surgery

## 2020-06-23 ENCOUNTER — Other Ambulatory Visit: Payer: Medicare HMO

## 2020-06-25 ENCOUNTER — Other Ambulatory Visit: Payer: Self-pay | Admitting: Surgical

## 2020-06-25 NOTE — Telephone Encounter (Signed)
Pls advise.  

## 2020-06-28 ENCOUNTER — Ambulatory Visit (INDEPENDENT_AMBULATORY_CARE_PROVIDER_SITE_OTHER): Payer: Medicare HMO

## 2020-06-28 ENCOUNTER — Ambulatory Visit: Payer: Medicare HMO | Admitting: Orthopedic Surgery

## 2020-06-28 DIAGNOSIS — S42202A Unspecified fracture of upper end of left humerus, initial encounter for closed fracture: Secondary | ICD-10-CM

## 2020-06-29 ENCOUNTER — Telehealth: Payer: Self-pay | Admitting: Orthopedic Surgery

## 2020-06-29 NOTE — Telephone Encounter (Signed)
Patient called requesting a refill of Tylenol 3. Please send to pharmacy on file.Patient phone number is [phone].

## 2020-06-29 NOTE — Telephone Encounter (Signed)
Please advise. Thanks.  

## 2020-06-29 NOTE — Telephone Encounter (Signed)
Patient called and asking for a call when medication has been sent to pharmacy. Patient phone number is [phone].

## 2020-06-29 NOTE — Telephone Encounter (Signed)
IC s/w patient and advised sent previous note to [facility] and was waiting to be advised that they were in surgery all day today.

## 2020-06-30 ENCOUNTER — Ambulatory Visit: Payer: Medicare HMO | Admitting: Physician Assistant

## 2020-06-30 NOTE — Telephone Encounter (Signed)
Sne in

## 2020-07-11 ENCOUNTER — Encounter: Payer: Self-pay | Admitting: Orthopedic Surgery

## 2020-07-11 NOTE — Progress Notes (Signed)
   Post-Op Visit Note   Patient: Cindy Figueroa           Date of Birth: Sep 07, 1950           MRN: [id number] Visit Date: 06/28/2020 PCP: Cindy Reid, PA-C   Assessment & Plan:  Chief Complaint:  Chief Complaint  Patient presents with  . f/u fx   Visit Diagnoses:  1. Closed fracture of proximal end of left humerus, unspecified fracture morphology, initial encounter     Plan: [name] is a 69 year old patient with left proximal humerus fracture sustained 11-21.  Only taking 1-2 pain tablets per day..  Fracture is moving as a unit on exam.  Deltoid is functional.  Plan to return to clinic 1129.  Tylenol 3 is helpful.  That is refilled.  Repeat radiographs on return.  Follow-Up Instructions: No follow-ups on file.   Orders:  Orders Placed This Encounter  Procedures  . XR Humerus Left   No orders of the defined types were placed in this encounter.   Imaging: No results found.  PMFS History: Patient Active Problem List   Diagnosis Date Noted  . Open wound of left lower leg 01/09/2019  . Pain in right upper arm 12/31/2018  . PTSD (post-traumatic stress disorder) 10/21/2018  . Anxiety in acute stress reaction 10/21/2018  . Healthcare maintenance 06/06/2018  . Screening for breast cancer 06/06/2018  . Encounter for Medicare annual wellness exam 12/06/2017  . Obesity with body mass index of 30.0-39.9 03/13/2017  . Hyperlipidemia 03/13/2017  . Vitamin D deficiency 01/11/2017  . HTN (hypertension) 12/26/2016  . Poor balance 12/26/2016  . H/O fatigue 12/26/2016  . Family history of high cholesterol 12/26/2016   Past Medical History:  Diagnosis Date  . Hypertension     Family History  Problem Relation Age of Onset  . Hyperlipidemia Mother   . Hypertension Mother   . Cancer Father   . Emphysema Father     No past surgical history on file. Social History   Occupational History  . Not on file  Tobacco Use  . Smoking status: Never Smoker  . Smokeless  tobacco: Never Used  Vaping Use  . Vaping Use: Never used  Substance and Sexual Activity  . Alcohol use: No  . Drug use: No  . Sexual activity: Not on file

## 2020-07-12 ENCOUNTER — Ambulatory Visit (INDEPENDENT_AMBULATORY_CARE_PROVIDER_SITE_OTHER): Payer: Medicare HMO

## 2020-07-12 ENCOUNTER — Ambulatory Visit: Payer: Medicare HMO | Admitting: Orthopedic Surgery

## 2020-07-12 DIAGNOSIS — S42202A Unspecified fracture of upper end of left humerus, initial encounter for closed fracture: Secondary | ICD-10-CM

## 2020-07-12 MED ORDER — ACETAMINOPHEN-CODEINE #3 300-30 MG PO TABS: 1.0000 | ORAL_TABLET | Freq: Four times a day (QID) | ORAL | 0 refills | Status: DC | PRN

## 2020-07-18 ENCOUNTER — Encounter: Payer: Self-pay | Admitting: Orthopedic Surgery

## 2020-07-18 NOTE — Progress Notes (Signed)
   Post-Op Visit Note   Patient: Cindy Figueroa           Date of Birth: Feb 20, 1951           MRN: [id number] Visit Date: 07/12/2020 PCP: Lorrene Reid, PA-C   Assessment & Plan:  Chief Complaint:  Chief Complaint  Patient presents with  . f/u humerus fx   Visit Diagnoses:  1. Closed fracture of proximal end of left humerus, unspecified fracture morphology, initial encounter     Plan: Cindy Figueroa 69 year old female 4 weeks out left proximal humerus fracture.  Taking Tylenol 3 which is refilled.  States she is unable to do what needs to be done.  Radiographs of the proximal humerus do show some callus formation present.  On exam deltoid fires in the shoulder does move as a unit.  She does not want formal physical therapy.  We did provide her with some home exercise sheets primarily for range of motion at this time.  Discontinue the sling but no lifting with that left arm.  Come back in 4 weeks for clinical recheck and final set of radiographs.  I did tell her that this would be a 4 to 16-month process to get some function back in her shoulder.  Reverse replacement always an option but she wants to avoid that if possible as well.  Follow-Up Instructions: Return in about 4 weeks (around 08/09/2020).   Orders:  Orders Placed This Encounter  Procedures  . XR Humerus Left   Meds ordered this encounter  Medications  . acetaminophen-codeine (TYLENOL #3) 300-30 MG tablet    Sig: Take 1 tablet by mouth every 6 (six) hours as needed for moderate pain.    Dispense:  30 tablet    Refill:  0    Imaging: No results found.  PMFS History: Patient Active Problem List   Diagnosis Date Noted  . Open wound of left lower leg 01/09/2019  . Pain in right upper arm 12/31/2018  . PTSD (post-traumatic stress disorder) 10/21/2018  . Anxiety in acute stress reaction 10/21/2018  . Healthcare maintenance 06/06/2018  . Screening for breast cancer 06/06/2018  . Encounter for Medicare annual wellness  exam 12/06/2017  . Obesity with body mass index of 30.0-39.9 03/13/2017  . Hyperlipidemia 03/13/2017  . Vitamin D deficiency 01/11/2017  . HTN (hypertension) 12/26/2016  . Poor balance 12/26/2016  . H/O fatigue 12/26/2016  . Family history of high cholesterol 12/26/2016   Past Medical History:  Diagnosis Date  . Hypertension     Family History  Problem Relation Age of Onset  . Hyperlipidemia Mother   . Hypertension Mother   . Cancer Father   . Emphysema Father     History reviewed. No pertinent surgical history. Social History   Occupational History  . Not on file  Tobacco Use  . Smoking status: Never Smoker  . Smokeless tobacco: Never Used  Vaping Use  . Vaping Use: Never used  Substance and Sexual Activity  . Alcohol use: No  . Drug use: No  . Sexual activity: Not on file

## 2020-07-20 ENCOUNTER — Ambulatory Visit (INDEPENDENT_AMBULATORY_CARE_PROVIDER_SITE_OTHER): Payer: Medicare HMO | Admitting: Physician Assistant

## 2020-07-20 ENCOUNTER — Encounter: Payer: Self-pay | Admitting: Physician Assistant

## 2020-07-20 ENCOUNTER — Other Ambulatory Visit: Payer: Self-pay

## 2020-07-20 VITALS — BP 156/76 | HR 80 | Ht 66.0 in | Wt 200.7 lb

## 2020-07-20 DIAGNOSIS — Z0000 Encounter for general adult medical examination without abnormal findings: Secondary | ICD-10-CM

## 2020-07-20 DIAGNOSIS — R600 Localized edema: Secondary | ICD-10-CM | POA: Diagnosis not present

## 2020-07-20 NOTE — Progress Notes (Signed)
Acute Office Visit  Subjective:    Patient ID: Cindy Figueroa, female    DOB: [birth year], 69 y.o.   MRN: [id number]  Chief Complaint  Patient presents with  . Foot Swelling    HPI Patient is in today for lower extremity edema. Patient states the Monday before Thanksgiving she started having lower extremity swelling, more on right lower leg than left. Symptoms have improved.  Denies increased sodium consumption, recent travel or long periods of immobilization, trauma or injury, pain, fever, chills, chest pain, palpitations or shortness of breath. Initially did have some warmth, has noticed some redness. Concerned about a possible blood clot. No family history of autoimmune conditions.   Past Medical History:  Diagnosis Date  . Hypertension     History reviewed. No pertinent surgical history.  Family History  Problem Relation Age of Onset  . Hyperlipidemia Mother   . Hypertension Mother   . Cancer Father   . Emphysema Father     Social History   Socioeconomic History  . Marital status: Married    Spouse name: Not on file  . Number of children: Not on file  . Years of education: Not on file  . Highest education level: Not on file  Occupational History  . Not on file  Tobacco Use  . Smoking status: Never Smoker  . Smokeless tobacco: Never Used  Vaping Use  . Vaping Use: Never used  Substance and Sexual Activity  . Alcohol use: No  . Drug use: No  . Sexual activity: Not on file  Other Topics Concern  . Not on file  Social History Narrative  . Not on file   Social Determinants of Health   Financial Resource Strain:   . Difficulty of Paying Living Expenses: Not on file  Food Insecurity:   . Worried About Charity fundraiser in the Last Year: Not on file  . Ran Out of Food in the Last Year: Not on file  Transportation Needs:   . Lack of Transportation (Medical): Not on file  . Lack of Transportation (Non-Medical): Not on file  Physical Activity:   . Days of  Exercise per Week: Not on file  . Minutes of Exercise per Session: Not on file  Stress:   . Feeling of Stress : Not on file  Social Connections:   . Frequency of Communication with Friends and Family: Not on file  . Frequency of Social Gatherings with Friends and Family: Not on file  . Attends Religious Services: Not on file  . Active Member of Clubs or Organizations: Not on file  . Attends Archivist Meetings: Not on file  . Marital Status: Not on file  Intimate Partner Violence:   . Fear of Current or Ex-Partner: Not on file  . Emotionally Abused: Not on file  . Physically Abused: Not on file  . Sexually Abused: Not on file    Outpatient Medications Prior to Visit  Medication Sig Dispense Refill  . acetaminophen-codeine (TYLENOL #3) 300-30 MG tablet Take 1 tablet by mouth every 6 (six) hours as needed for moderate pain. 30 tablet 0  . Acetaminophen-Codeine 300-30 MG tablet Take 1 tablet by mouth every 12 (twelve) hours as needed for pain. 20 tablet 0  . Biotin 5000 MCG CAPS Take 1 capsule by mouth daily.    Marland Kitchen losartan-hydrochlorothiazide (HYZAAR) 50-12.5 MG tablet TAKE 1 TABLET BY MOUTH EVERY DAY 90 tablet 0  . morphine (MSIR) 15 MG tablet Take 0.5 tablets (7.5  mg total) by mouth every 4 (four) hours as needed for severe pain. 7 tablet 0  . UNABLE TO FIND Take 1 capsule by mouth 3 (three) times daily. Med Name: Cardio Chelate with EDTA    . diclofenac Sodium (VOLTAREN) 1 % GEL Apply 4 g topically 4 (four) times daily. (Patient not taking: Reported on 07/20/2020) 100 g 0   No facility-administered medications prior to visit.    Allergies  Allergen Reactions  . Penicillins   . Plavix [Clopidogrel Bisulfate] Hives    Review of Systems  A fourteen system review of systems was performed and found to be positive as per HPI.  Objective:    Physical Exam General:  Well Developed, well nourished, appropriate for stated age.  Neuro:  Alert and oriented,  extra-ocular  muscles intact  HEENT:  Normocephalic, atraumatic, neck supple Skin:  no gross rash. B/L lower extremities with mild redness and warmth. Cardiac:  RRR, S1 S2 Respiratory:  ECTA B/L and A/P, Not using accessory muscles, speaking in full sentences- unlabored. Vascular:  Mild lower extremity edema. Psych:  No HI/SI, judgement and insight good, Euthymic mood. Full Affect.   BP (!) 156/76   Pulse 80   Ht _0  (1.676 m)   Wt 200 lb 11.2 oz (91 kg)   SpO2 98%   BMI 32.39 kg/m  Wt Readings from Last 3 Encounters:  07/20/20 200 lb 11.2 oz (91 kg)  03/12/20 192 lb (87.1 kg)  12/29/19 203 lb 4.8 oz (92.2 kg)    Health Maintenance Due  Topic Date Due  . Hepatitis C Screening  Never done  . COVID-19 Vaccine (1) Never done  . DEXA SCAN  Never done  . PNA vac Low Risk Adult (1 of 2 - PCV13) Never done  . INFLUENZA VACCINE  Never done    There are no preventive care reminders to display for this patient.   Lab Results  Component Value Date   TSH 1.050 12/31/2018   Lab Results  Component Value Date   WBC 7.2 01/09/2019   HGB 14.3 01/09/2019   HCT 42.8 01/09/2019   MCV 86 01/09/2019   PLT 314 01/09/2019   Lab Results  Component Value Date   NA 142 12/31/2018   K 4.6 12/31/2018   CO2 25 12/31/2018   GLUCOSE 98 12/31/2018   BUN 7 (L) 12/31/2018   CREATININE 0.57 12/31/2018   BILITOT 0.7 12/31/2018   ALKPHOS 59 12/31/2018   AST 14 12/31/2018   ALT 14 12/31/2018   PROT 6.0 12/31/2018   ALBUMIN 4.4 12/31/2018   CALCIUM 9.8 12/31/2018   Lab Results  Component Value Date   CHOL 181 07/04/2019   Lab Results  Component Value Date   HDL 44 07/04/2019   Lab Results  Component Value Date   LDLCALC 117 (H) 07/04/2019   Lab Results  Component Value Date   TRIG 111 07/04/2019   Lab Results  Component Value Date   CHOLHDL 4.1 07/04/2019   Lab Results  Component Value Date   HGBA1C 5.3 12/31/2018       Assessment & Plan:   Problem List Items Addressed This  Visit      Other   Healthcare maintenance   Relevant Orders   Comp Met (CMET)   Lipid Profile   CBC w/Diff   Uric acid   HgB A1c   TSH    Other Visit Diagnoses    Lower extremity edema    -  Primary  Relevant Orders   Comp Met (CMET)   CBC w/Diff   Uric acid   VAS [place] LOWER EXTREMITY VENOUS (DVT)     Lower extremity edema: -Recommend elevation, use compression socks and follow a low sodium diet.  -Low suspicion for DVT but will place order for venous ultrasound to rule out.  -Will place lab orders for CBC, CMP, TSH and uric acid to evaluate for possible infectious etiology, hepatic or renal disease, thyroid disorder or gout. Likely vascular related (venous insufficiency). Patient denies cardiopulmonary symptoms so less likely CHF. -If symptoms fail to improve or worsen recommend further evaluation such as echocardiogram. Patient verbalized understanding.  Healthcare Maintenance: -Patient is fasting so will collect FBW today.  -Follow up as scheduled.   No orders of the defined types were placed in this encounter.    Lorrene Reid, PA-C

## 2020-07-20 NOTE — Patient Instructions (Signed)
Edema  Edema is when you have too much fluid in your body or under your skin. Edema may make your legs, feet, and ankles swell up. Swelling is also common in looser tissues, like around your eyes. This is a common condition. It gets more common as you get older. There are many possible causes of edema. Eating too much salt (sodium) and being on your feet or sitting for a long time can cause edema in your legs, feet, and ankles. Hot weather may make edema worse. Edema is usually painless. Your skin may look swollen or shiny. Follow these instructions at home:  Keep the swollen body part raised (elevated) above the level of your heart when you are sitting or lying down.  Do not sit still or stand for a long time.  Do not wear tight clothes. Do not wear garters on your upper legs.  Exercise your legs. This can help the swelling go down.  Wear elastic bandages or support stockings as told by your doctor.  Eat a low-salt (low-sodium) diet to reduce fluid as told by your doctor.  Depending on the cause of your swelling, you may need to limit how much fluid you drink (fluid restriction).  Take over-the-counter and prescription medicines only as told by your doctor. Contact a doctor if:  Treatment is not working.  You have heart, liver, or kidney disease and have symptoms of edema.  You have sudden and unexplained weight gain. Get help right away if:  You have shortness of breath or chest pain.  You cannot breathe when you lie down.  You have pain, redness, or warmth in the swollen areas.  You have heart, liver, or kidney disease and get edema all of a sudden.  You have a fever and your symptoms get worse all of a sudden. Summary  Edema is when you have too much fluid in your body or under your skin.  Edema may make your legs, feet, and ankles swell up. Swelling is also common in looser tissues, like around your eyes.  Raise (elevate) the swollen body part above the level of your  heart when you are sitting or lying down.  Follow your doctor's instructions about diet and how much fluid you can drink (fluid restriction). This information is not intended to replace advice given to you by your health care provider. Make sure you discuss any questions you have with your health care provider. Document Revised: 08/03/2017 Document Reviewed: 08/18/2016 Elsevier Patient Education  2020 Elsevier Inc[employer]  

## 2020-07-21 ENCOUNTER — Telehealth: Payer: Self-pay | Admitting: Physician Assistant

## 2020-07-21 ENCOUNTER — Ambulatory Visit (HOSPITAL_COMMUNITY)
Admission: RE | Admit: 2020-07-21 | Discharge: 2020-07-21 | Disposition: A | Discharge status: Home / Self Care | Payer: Medicare HMO | Source: Ambulatory Visit | Attending: Physician Assistant | Admitting: Physician Assistant

## 2020-07-21 DIAGNOSIS — I82813 Embolism and thrombosis of superficial veins of lower extremities, bilateral: Secondary | ICD-10-CM

## 2020-07-21 DIAGNOSIS — R600 Localized edema: Secondary | ICD-10-CM | POA: Diagnosis not present

## 2020-07-21 LAB — CBC WITH DIFFERENTIAL/PLATELET
Basophils Absolute: 0 10*3/uL (ref 0.0–0.2)
Basos: 0 %
EOS (ABSOLUTE): 0.1 10*3/uL (ref 0.0–0.4)
Eos: 2 %
Hematocrit: 42.1 % (ref 34.0–46.6)
Hemoglobin: 13.5 g/dL (ref 11.1–15.9)
Immature Grans (Abs): 0 10*3/uL (ref 0.0–0.1)
Immature Granulocytes: 0 %
Lymphocytes Absolute: 1.8 10*3/uL (ref 0.7–3.1)
Lymphs: 27 %
MCH: 28.1 pg (ref 26.6–33.0)
MCHC: 32.1 g/dL (ref 31.5–35.7)
MCV: 88 fL (ref 79–97)
Monocytes Absolute: 0.5 10*3/uL (ref 0.1–0.9)
Monocytes: 7 %
Neutrophils Absolute: 4.4 10*3/uL (ref 1.4–7.0)
Neutrophils: 64 %
Platelets: 310 10*3/uL (ref 150–450)
RBC: 4.8 x10E6/uL (ref 3.77–5.28)
RDW: 12.1 % (ref 11.7–15.4)
WBC: 6.9 10*3/uL (ref 3.4–10.8)

## 2020-07-21 LAB — COMPREHENSIVE METABOLIC PANEL
ALT: 12 IU/L (ref 0–32)
AST: 10 IU/L (ref 0–40)
Albumin/Globulin Ratio: 2.6 — ABNORMAL HIGH (ref 1.2–2.2)
Albumin: 4.4 g/dL (ref 3.8–4.8)
Alkaline Phosphatase: 87 IU/L (ref 44–121)
BUN/Creatinine Ratio: 21 (ref 12–28)
BUN: 11 mg/dL (ref 8–27)
Bilirubin Total: 0.6 mg/dL (ref 0.0–1.2)
CO2: 24 mmol/L (ref 20–29)
Calcium: 9.6 mg/dL (ref 8.7–10.3)
Chloride: 103 mmol/L (ref 96–106)
Creatinine, Ser: 0.52 mg/dL — ABNORMAL LOW (ref 0.57–1.00)
GFR calc Af Amer: 112 mL/min/{1.73_m2} (ref >59)
GFR calc non Af Amer: 97 mL/min/{1.73_m2} (ref >59)
Globulin, Total: 1.7 g/dL (ref 1.5–4.5)
Glucose: 91 mg/dL (ref 65–99)
Potassium: 3.9 mmol/L (ref 3.5–5.2)
Sodium: 141 mmol/L (ref 134–144)
Total Protein: 6.1 g/dL (ref 6.0–8.5)

## 2020-07-21 LAB — URIC ACID: Uric Acid: 4.5 mg/dL (ref 3.0–7.2)

## 2020-07-21 LAB — HEMOGLOBIN A1C
Est. average glucose Bld gHb Est-mCnc: 100 mg/dL
Hgb A1c MFr Bld: 5.1 % (ref 4.8–5.6)

## 2020-07-21 LAB — LIPID PANEL
Chol/HDL Ratio: 3.7 ratio (ref 0.0–4.4)
Cholesterol, Total: 185 mg/dL (ref 100–199)
HDL: 50 mg/dL (ref >39)
LDL Chol Calc (NIH): 121 mg/dL — ABNORMAL HIGH (ref 0–99)
Triglycerides: 75 mg/dL (ref 0–149)
VLDL Cholesterol Cal: 14 mg/dL (ref 5–40)

## 2020-07-21 LAB — TSH: TSH: 0.753 u[IU]/mL (ref 0.450–4.500)

## 2020-07-21 MED ORDER — APIXABAN 5 MG PO TABS: 5.0000 mg | ORAL_TABLET | Freq: Two times a day (BID) | ORAL | 0 refills | Status: DC

## 2020-07-21 NOTE — Telephone Encounter (Signed)
Left message for patient to call back. AS, CMA 

## 2020-07-21 NOTE — Telephone Encounter (Signed)
I will start prophylactic anticoagulant treatment with Apixaban (Eliquis) 10 mg once daily for 45 days since small saphenous vein is involved. Recommend to avoid other drugs that may increase bleeding when taken with this medication such as aspirin and NSAIDs (ibuprofen, naproxen etc).   Thank you,  Herb Grays

## 2020-07-21 NOTE — Telephone Encounter (Signed)
Patient had vascular study done today and was negative for DVT but does have superficial clot in the small saphenous vein bilateral.   AS, CMA

## 2020-07-22 ENCOUNTER — Telehealth: Payer: Self-pay | Admitting: Physician Assistant

## 2020-07-22 NOTE — Telephone Encounter (Signed)
Patient was returning Athena's call from Yesterday. I looked in encounters and I believe it was about medications or something. Please call her back thanks.

## 2020-07-22 NOTE — Telephone Encounter (Signed)
Patient is aware of the results and verbalized understanding. AS, CMA 

## 2020-07-23 ENCOUNTER — Telehealth: Payer: Self-pay | Admitting: Physician Assistant

## 2020-07-23 NOTE — Telephone Encounter (Signed)
Please advise.  T. Herve Haug, CMA 

## 2020-07-23 NOTE — Telephone Encounter (Signed)
Unfortunately, there is no generic of Eliquis. The most cost affordable alternative is to take over the counter Ibuprofen 400 mg twice daily x 7 days and wear compression stockings as discussed at visit. The bilateral superficial blood clot is chronic and ultimately patient will benefit the most from wearing compression stockings more regularly.   Thank you, Herb Grays

## 2020-07-23 NOTE — Telephone Encounter (Signed)
Patient left message on voicemail that she could not afford the Eliquis.  She wants to know if there is a generic medication that can be called into the pharmacy.  Please Advise!

## 2020-07-26 NOTE — Telephone Encounter (Signed)
Phone is busy.  Will attempt again.  Charyl Bigger, CMA

## 2020-07-27 NOTE — Telephone Encounter (Signed)
Pt informed.  Pt expressed understanding and is agreeable.  T. Calem Cocozza, CMA  

## 2020-08-11 ENCOUNTER — Telehealth: Payer: Self-pay | Admitting: Physician Assistant

## 2020-08-12 NOTE — Telephone Encounter (Signed)
Cindy Figueroa is [facility] [phone] requested [facility] fax umber to recommend a Bone Density for patient. thank you

## 2020-08-16 ENCOUNTER — Other Ambulatory Visit: Payer: Self-pay | Admitting: Orthopedic Surgery

## 2020-08-16 ENCOUNTER — Other Ambulatory Visit: Payer: Self-pay | Admitting: Physician Assistant

## 2020-08-16 ENCOUNTER — Ambulatory Visit: Payer: Medicare HMO | Admitting: Orthopedic Surgery

## 2020-08-16 ENCOUNTER — Other Ambulatory Visit: Payer: Self-pay | Admitting: Surgical

## 2020-08-16 DIAGNOSIS — I10 Essential (primary) hypertension: Secondary | ICD-10-CM

## 2020-08-16 NOTE — Telephone Encounter (Signed)
Pls advise.  

## 2020-08-17 ENCOUNTER — Other Ambulatory Visit: Payer: Medicare HMO

## 2020-08-18 ENCOUNTER — Other Ambulatory Visit: Payer: Self-pay | Admitting: Physician Assistant

## 2020-08-18 ENCOUNTER — Ambulatory Visit: Payer: Medicare HMO | Admitting: Orthopedic Surgery

## 2020-08-18 ENCOUNTER — Ambulatory Visit (INDEPENDENT_AMBULATORY_CARE_PROVIDER_SITE_OTHER): Payer: Medicare HMO

## 2020-08-18 DIAGNOSIS — S42202A Unspecified fracture of upper end of left humerus, initial encounter for closed fracture: Secondary | ICD-10-CM | POA: Diagnosis not present

## 2020-08-18 DIAGNOSIS — I10 Essential (primary) hypertension: Secondary | ICD-10-CM

## 2020-08-18 MED ORDER — ACETAMINOPHEN-CODEINE 300-30 MG PO TABS: 1.0000 | ORAL_TABLET | Freq: Every day | ORAL | 0 refills | Status: DC | PRN

## 2020-08-19 ENCOUNTER — Other Ambulatory Visit: Payer: Self-pay

## 2020-08-19 ENCOUNTER — Other Ambulatory Visit: Payer: Self-pay | Admitting: Physician Assistant

## 2020-08-19 ENCOUNTER — Ambulatory Visit (INDEPENDENT_AMBULATORY_CARE_PROVIDER_SITE_OTHER): Payer: Medicare HMO | Admitting: Physician Assistant

## 2020-08-19 ENCOUNTER — Encounter: Payer: Self-pay | Admitting: Physician Assistant

## 2020-08-19 VITALS — BP 134/79 | HR 75 | Ht 66.0 in | Wt 197.5 lb

## 2020-08-19 DIAGNOSIS — I10 Essential (primary) hypertension: Secondary | ICD-10-CM

## 2020-08-19 DIAGNOSIS — L259 Unspecified contact dermatitis, unspecified cause: Secondary | ICD-10-CM

## 2020-08-19 DIAGNOSIS — R600 Localized edema: Secondary | ICD-10-CM

## 2020-08-19 DIAGNOSIS — Z6831 Body mass index (BMI) 31.0-31.9, adult: Secondary | ICD-10-CM

## 2020-08-19 DIAGNOSIS — E785 Hyperlipidemia, unspecified: Secondary | ICD-10-CM

## 2020-08-19 DIAGNOSIS — Z1231 Encounter for screening mammogram for malignant neoplasm of breast: Secondary | ICD-10-CM

## 2020-08-19 MED ORDER — LOSARTAN POTASSIUM-HCTZ 100-25 MG PO TABS: 1.0000 | ORAL_TABLET | Freq: Every day | ORAL | 0 refills | Status: DC

## 2020-08-19 NOTE — Progress Notes (Signed)
Established Patient Office Visit  Subjective:  Patient ID: Cindy Figueroa, female    DOB: March 29, 1951  Age: 70 y.o. MRN: [id number]  CC:  Chief Complaint  Patient presents with  . Hypertension  . Hyperlipidemia  . Weight Loss    HPI Cindy Figueroa presents for follow-up on hypertension, hyperlipidemia and weight.  Has complaints of an itchy and red rash of right lower extremity, which started when she started wearing stockings.  Denies fever, chills, new detergents or night sweats.  HTN: Pt denies chest pain, palpitations, headache or dizziness. Lower extremity swelling has improved. Stopped using compression socks due to rash. Taking medication as directed without side effects.  Checks blood pressure at home once in a while and readings range in the 130s/60s. Pt follows a low salt diet. Tries to stay hydrated.  HLD: Pt is trying to manage with diet changes.  Reports years ago was on a statin medication and did not tolerate it well.  Cannot remember medication or side effects she experienced.  Is reluctant to starting statin therapy  Weight: States has not lost as much as she wishes.  Reports needs to improve diligence with diet and being active.  Follows a vegetarian diet.  Past Medical History:  Diagnosis Date  . Hypertension     History reviewed. No pertinent surgical history.  Family History  Problem Relation Age of Onset  . Hyperlipidemia Mother   . Hypertension Mother   . Cancer Father   . Emphysema Father     Social History   Socioeconomic History  . Marital status: Married    Spouse name: Not on file  . Number of children: Not on file  . Years of education: Not on file  . Highest education level: Not on file  Occupational History  . Not on file  Tobacco Use  . Smoking status: Never Smoker  . Smokeless tobacco: Never Used  Vaping Use  . Vaping Use: Never used  Substance and Sexual Activity  . Alcohol use: No  . Drug use: No  . Sexual activity: Not on  file  Other Topics Concern  . Not on file  Social History Narrative  . Not on file   Social Determinants of Health   Financial Resource Strain: Not on file  Food Insecurity: Not on file  Transportation Needs: Not on file  Physical Activity: Not on file  Stress: Not on file  Social Connections: Not on file  Intimate Partner Violence: Not on file    Outpatient Medications Prior to Visit  Medication Sig Dispense Refill  . Acetaminophen-Codeine 300-30 MG tablet Take 1 tablet by mouth daily as needed for pain. 25 tablet 0  . Biotin 5000 MCG CAPS Take 1 capsule by mouth daily.    Marland Kitchen morphine (MSIR) 15 MG tablet Take 0.5 tablets (7.5 mg total) by mouth every 4 (four) hours as needed for severe pain. 7 tablet 0  . losartan-hydrochlorothiazide (HYZAAR) 50-12.5 MG tablet TAKE 1 TABLET BY MOUTH EVERY DAY 90 tablet 0  . apixaban (ELIQUIS) 5 MG TABS tablet Take 1 tablet (5 mg total) by mouth 2 (two) times daily. (Patient not taking: Reported on 08/19/2020) 90 tablet 0  . UNABLE TO FIND Take 1 capsule by mouth 3 (three) times daily. Med Name: Cardio Chelate with EDTA (Patient not taking: Reported on 08/19/2020)     No facility-administered medications prior to visit.    Allergies  Allergen Reactions  . Penicillins   . Plavix [Clopidogrel Bisulfate] Hives  ROS Review of Systems A fourteen system review of systems was performed and found to be positive as per HPI.   Objective:    Physical Exam General:  Well Developed, well nourished, appropriate for stated age.  Neuro:  Alert and oriented,  extra-ocular muscles intact  HEENT:  Normocephalic, atraumatic, neck supple, no carotid bruits appreciated  Skin:  Erythematous, dry bumps present on right lower extremity. Excoriations noted.  Cardiac:  RRR, S1 S2 Respiratory:  ECTA B/L w/o wheezing, crackles, rales, Not using accessory muscles, speaking in full sentences- unlabored. Vascular:  Ext warm, no cyanosis apprec.; Trace edema,  R>L Psych:  No HI/SI, judgement and insight good, Euthymic mood. Full Affect.   BP 134/79   Pulse 75   Ht 5\' 6"  (1.676 m)   Wt 197 lb 8 oz (89.6 kg)   SpO2 98%   BMI 31.88 kg/m  Wt Readings from Last 3 Encounters:  08/19/20 197 lb 8 oz (89.6 kg)  07/20/20 200 lb 11.2 oz (91 kg)  03/12/20 192 lb (87.1 kg)     Health Maintenance Due  Topic Date Due  . Hepatitis C Screening  Never done  . COVID-19 Vaccine (1) Never done  . DEXA SCAN  Never done  . PNA vac Low Risk Adult (1 of 2 - PCV13) Never done  . INFLUENZA VACCINE  Never done    There are no preventive care reminders to display for this patient.  Lab Results  Component Value Date   TSH 0.753 07/20/2020   Lab Results  Component Value Date   WBC 6.9 07/20/2020   HGB 13.5 07/20/2020   HCT 42.1 07/20/2020   MCV 88 07/20/2020   PLT 310 07/20/2020   Lab Results  Component Value Date   NA 141 07/20/2020   K 3.9 07/20/2020   CO2 24 07/20/2020   GLUCOSE 91 07/20/2020   BUN 11 07/20/2020   CREATININE 0.52 (L) 07/20/2020   BILITOT 0.6 07/20/2020   ALKPHOS 87 07/20/2020   AST 10 07/20/2020   ALT 12 07/20/2020   PROT 6.1 07/20/2020   ALBUMIN 4.4 07/20/2020   CALCIUM 9.6 07/20/2020   Lab Results  Component Value Date   CHOL 185 07/20/2020   Lab Results  Component Value Date   HDL 50 07/20/2020   Lab Results  Component Value Date   LDLCALC 121 (H) 07/20/2020   Lab Results  Component Value Date   TRIG 75 07/20/2020   Lab Results  Component Value Date   CHOLHDL 3.7 07/20/2020   Lab Results  Component Value Date   HGBA1C 5.1 07/20/2020      Assessment & Plan:   Problem List Items Addressed This Visit      Cardiovascular and Mediastinum   HTN (hypertension) - Primary   Relevant Medications   losartan-hydrochlorothiazide (HYZAAR) 100-25 MG tablet     Other   Hyperlipidemia   Relevant Medications   losartan-hydrochlorothiazide (HYZAAR) 100-25 MG tablet    Other Visit Diagnoses    Contact  dermatitis, unspecified contact dermatitis type, unspecified trigger       BMI 31.0-31.9,adult       Lower extremity edema         Hypertension: -Fairly controlled. -Discussed with patient adjusting medication dose to help improve lower extremity edema. Patient is agreeable. Will increase Hyzaar to 100-25 mg.  Advised to monitor blood pressure and pulse daily for at least 2 weeks and keep a log. Recommend to notify the office if BP consistently <100/90  or <140/90.  Patient verbalized understanding. -Recommend to stay well-hydrated and continue low-sodium diet. -Will continue to monitor. Recent CMP: renal function stable, electrolytes normal.  Hyperlipidemia: -Recent lipid panel: Total cholesterol 185, triglycerides 75, HDL 50, LDL 121 -The 10-year ASCVD risk score Mikey Bussing DC Jr., et al., 2013) is: 13.7% -Discussed with patient starting statin therapy and prefers to continue with dietary and lifestyle modifications.  Will repeat lipid plan next office visit and highly recommend to consider statin therapy if results continue to be elevated. -Recommend to continue to reduce saturated and trans fats. Increase physical activity.  Contact dermatitis, unspecified contact dermatitis type, unspecified trigger: -Patient prefers to try over-the-counter hydrocortisone cream.  If symptoms fail to improve or worsen will send Rx for topical corticosteroids such as triamcinolone cream. Verbalized understanding. -Advised to stop scratching and apply topical antibiotic to lesions.   Lower extremity edema: -Has improved. 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  -Recommend to continue with low sodium diet and elevation. Will increase Hyzaar. -Will continue to monitor.  BMI 31.0-31.9, adult: -Encourage to continue with weight loss efforts. Patient has lost 3 pounds since last visit in 07/20/2020.  Meds ordered this encounter  Medications  . losartan-hydrochlorothiazide (HYZAAR)  100-25 MG tablet    Sig: Take 1 tablet by mouth daily.    Dispense:  90 tablet    Refill:  0    Order Specific Question:   Supervising Provider    Answer:   Beatrice Lecher D [[id number]    Follow-up: Return in about 4 months (around 12/17/2020) for HTN, HLD with FBW 1 wk before apt (CMP, Lipid).   Note:  This note was prepared with assistance of Dragon voice recognition software. Occasional wrong-word or sound-a-like substitutions may have occurred due to the inherent limitations of voice recognition software.  Lorrene Reid, PA-C

## 2020-08-19 NOTE — Patient Instructions (Signed)
DASH Eating Plan DASH stands for "Dietary Approaches to Stop Hypertension." The DASH eating plan is a healthy eating plan that has been shown to reduce high blood pressure (hypertension). It may also reduce your risk for type 2 diabetes, heart disease, and stroke. The DASH eating plan may also help with weight loss. What are tips for following this plan?  General guidelines  Avoid eating more than 2,300 mg (milligrams) of salt (sodium) a day. If you have hypertension, you may need to reduce your sodium intake to 1,500 mg a day.  Limit alcohol intake to no more than 1 drink a day for nonpregnant women and 2 drinks a day for men. One drink equals 12 oz of beer, 5 oz of wine, or 1 oz of hard liquor.  Work with your health care provider to maintain a healthy body weight or to lose weight. Ask what an ideal weight is for you.  Get at least 30 minutes of exercise that causes your heart to beat faster (aerobic exercise) most days of the week. Activities may include walking, swimming, or biking.  Work with your health care provider or diet and nutrition specialist (dietitian) to adjust your eating plan to your individual calorie needs. Reading food labels   Check food labels for the amount of sodium per serving. Choose foods with less than 5 percent of the Daily Value of sodium. Generally, foods with less than 300 mg of sodium per serving fit into this eating plan.  To find whole grains, look for the word "whole" as the first word in the ingredient list. Shopping  Buy products labeled as "low-sodium" or "no salt added."  Buy fresh foods. Avoid canned foods and premade or frozen meals. Cooking  Avoid adding salt when cooking. Use salt-free seasonings or herbs instead of table salt or sea salt. Check with your health care provider or pharmacist before using salt substitutes.  Do not fry foods. Cook foods using healthy methods such as baking, boiling, grilling, and broiling instead.  Cook with  heart-healthy oils, such as olive, canola, soybean, or sunflower oil. Meal planning  Eat a balanced diet that includes: ? 5 or more servings of fruits and vegetables each day. At each meal, try to fill half of your plate with fruits and vegetables. ? Up to 6-8 servings of whole grains each day. ? Less than 6 oz of lean meat, poultry, or fish each day. A 3-oz serving of meat is about the same size as a deck of cards. One egg equals 1 oz. ? 2 servings of low-fat dairy each day. ? A serving of nuts, seeds, or beans 5 times each week. ? Heart-healthy fats. Healthy fats called Omega-3 fatty acids are found in foods such as flaxseeds and coldwater fish, like sardines, salmon, and mackerel.  Limit how much you eat of the following: ? Canned or prepackaged foods. ? Food that is high in trans fat, such as fried foods. ? Food that is high in saturated fat, such as fatty meat. ? Sweets, desserts, sugary drinks, and other foods with added sugar. ? Full-fat dairy products.  Do not salt foods before eating.  Try to eat at least 2 vegetarian meals each week.  Eat more home-cooked food and less restaurant, buffet, and fast food.  When eating at a restaurant, ask that your food be prepared with less salt or no salt, if possible. What foods are recommended? The items listed may not be a complete list. Talk with your dietitian about   what dietary choices are best for you. Grains Whole-grain or whole-wheat bread. Whole-grain or whole-wheat pasta. Brown rice. Oatmeal. Quinoa. Bulgur. Whole-grain and low-sodium cereals. Pita bread. Low-fat, low-sodium crackers. Whole-wheat flour tortillas. Vegetables Fresh or frozen vegetables (raw, steamed, roasted, or grilled). Low-sodium or reduced-sodium tomato and vegetable juice. Low-sodium or reduced-sodium tomato sauce and tomato paste. Low-sodium or reduced-sodium canned vegetables. Fruits All fresh, dried, or frozen fruit. Canned fruit in natural juice (without  added sugar). Meat and other protein foods Skinless chicken or turkey. Ground chicken or turkey. Pork with fat trimmed off. Fish and seafood. Egg whites. Dried beans, peas, or lentils. Unsalted nuts, nut butters, and seeds. Unsalted canned beans. Lean cuts of beef with fat trimmed off. Low-sodium, lean deli meat. Dairy Low-fat (1%) or fat-free (skim) milk. Fat-free, low-fat, or reduced-fat cheeses. Nonfat, low-sodium ricotta or cottage cheese. Low-fat or nonfat yogurt. Low-fat, low-sodium cheese. Fats and oils Soft margarine without trans fats. Vegetable oil. Low-fat, reduced-fat, or light mayonnaise and salad dressings (reduced-sodium). Canola, safflower, olive, soybean, and sunflower oils. Avocado. Seasoning and other foods Herbs. Spices. Seasoning mixes without salt. Unsalted popcorn and pretzels. Fat-free sweets. What foods are not recommended? The items listed may not be a complete list. Talk with your dietitian about what dietary choices are best for you. Grains Baked goods made with fat, such as croissants, muffins, or some breads. Dry pasta or rice meal packs. Vegetables Creamed or fried vegetables. Vegetables in a cheese sauce. Regular canned vegetables (not low-sodium or reduced-sodium). Regular canned tomato sauce and paste (not low-sodium or reduced-sodium). Regular tomato and vegetable juice (not low-sodium or reduced-sodium). Pickles. Olives. Fruits Canned fruit in a light or heavy syrup. Fried fruit. Fruit in cream or butter sauce. Meat and other protein foods Fatty cuts of meat. Ribs. Fried meat. Bacon. Sausage. Bologna and other processed lunch meats. Salami. Fatback. Hotdogs. Bratwurst. Salted nuts and seeds. Canned beans with added salt. Canned or smoked fish. Whole eggs or egg yolks. Chicken or turkey with skin. Dairy Whole or 2% milk, cream, and half-and-half. Whole or full-fat cream cheese. Whole-fat or sweetened yogurt. Full-fat cheese. Nondairy creamers. Whipped toppings.  Processed cheese and cheese spreads. Fats and oils Butter. Stick margarine. Lard. Shortening. Ghee. Bacon fat. Tropical oils, such as coconut, palm kernel, or palm oil. Seasoning and other foods Salted popcorn and pretzels. Onion salt, garlic salt, seasoned salt, table salt, and sea salt. Worcestershire sauce. Tartar sauce. Barbecue sauce. Teriyaki sauce. Soy sauce, including reduced-sodium. Steak sauce. Canned and packaged gravies. Fish sauce. Oyster sauce. Cocktail sauce. Horseradish that you find on the shelf. Ketchup. Mustard. Meat flavorings and tenderizers. Bouillon cubes. Hot sauce and Tabasco sauce. Premade or packaged marinades. Premade or packaged taco seasonings. Relishes. Regular salad dressings. Where to find more information:  National Heart, Lung, and Blood Institute: www.nhlbi.nih.gov  American Heart Association: www.heart.org Summary  The DASH eating plan is a healthy eating plan that has been shown to reduce high blood pressure (hypertension). It may also reduce your risk for type 2 diabetes, heart disease, and stroke.  With the DASH eating plan, you should limit salt (sodium) intake to 2,300 mg a day. If you have hypertension, you may need to reduce your sodium intake to 1,500 mg a day.  When on the DASH eating plan, aim to eat more fresh fruits and vegetables, whole grains, lean proteins, low-fat dairy, and heart-healthy fats.  Work with your health care provider or diet and nutrition specialist (dietitian) to adjust your eating plan to your   individual calorie needs. This information is not intended to replace advice given to you by your health care provider. Make sure you discuss any questions you have with your health care provider. Document Revised: 07/13/2017 Document Reviewed: 07/24/2016 Elsevier Patient Education  2020 Elsevier Inc.  

## 2020-08-21 ENCOUNTER — Encounter: Payer: Self-pay | Admitting: Orthopedic Surgery

## 2020-08-21 NOTE — Progress Notes (Unsigned)
   Fracture Visit Note   Patient: Cindy Figueroa           Date of Birth: October 13, 1950           MRN: [id number] Visit Date: 08/18/2020 PCP: Lorrene Reid, PA-C   Assessment & Plan:  Chief Complaint:  Chief Complaint  Patient presents with  . Left Shoulder - Follow-up, Fracture   Visit Diagnoses:  1. Closed fracture of proximal end of left humerus, unspecified fracture morphology, initial encounter     Plan: Patient is a 70 year old female presents s/p left proximal humerus fracture sustained on 06/14/2020.  She presents for reevaluation.  She is sleeping in bed and feels that her pain is progressively improving.  She notes improvement is a slow but steady process and she has not had any plateau yet.  She is doing home exercise program to work on range of motion of the left shoulder and using a shoulder pulley at home as well as exercises on an exercise sheet that was given by the office.  Her goal is to avoid any surgery.  On exam she has 30 degrees external rotation, 50 degrees abduction, 90 degrees forward flexion.  The shoulder feels that it moves as a unit.  Axillary nerve intact with deltoid firing.  Radial nerve and ulnar nerves intact with intact abduction and adduction of the fingers and EPL/wrist extension.  Overall she is progressing well.  Prescribed Tylenol 3 for pain control to take once a day as needed.  4-week return for clinical recheck.  Follow-Up Instructions: No follow-ups on file.   Orders:  Orders Placed This Encounter  Procedures  . XR Humerus Left   Meds ordered this encounter  Medications  . Acetaminophen-Codeine 300-30 MG tablet    Sig: Take 1 tablet by mouth daily as needed for pain.    Dispense:  25 tablet    Refill:  0    Not to exceed 5 additional fills before 01/08/2021    Imaging: No results found.  PMFS History: Patient Active Problem List   Diagnosis Date Noted  . Open wound of left lower leg 01/09/2019  . Pain in right upper arm  12/31/2018  . PTSD (post-traumatic stress disorder) 10/21/2018  . Anxiety in acute stress reaction 10/21/2018  . Healthcare maintenance 06/06/2018  . Screening for breast cancer 06/06/2018  . Encounter for Medicare annual wellness exam 12/06/2017  . Obesity with body mass index of 30.0-39.9 03/13/2017  . Hyperlipidemia 03/13/2017  . Vitamin D deficiency 01/11/2017  . HTN (hypertension) 12/26/2016  . Poor balance 12/26/2016  . H/O fatigue 12/26/2016  . Family history of high cholesterol 12/26/2016   Past Medical History:  Diagnosis Date  . Hypertension     Family History  Problem Relation Age of Onset  . Hyperlipidemia Mother   . Hypertension Mother   . Cancer Father   . Emphysema Father     No past surgical history on file. Social History   Occupational History  . Not on file  Tobacco Use  . Smoking status: Never Smoker  . Smokeless tobacco: Never Used  Vaping Use  . Vaping Use: Never used  Substance and Sexual Activity  . Alcohol use: No  . Drug use: No  . Sexual activity: Not on file

## 2020-08-22 NOTE — Telephone Encounter (Signed)
Ok to split

## 2020-08-23 MED ORDER — LOSARTAN POTASSIUM 100 MG PO TABS: 100.0000 mg | ORAL_TABLET | Freq: Every day | ORAL | 0 refills | Status: DC

## 2020-08-23 MED ORDER — HYDROCHLOROTHIAZIDE 25 MG PO TABS: 25.0000 mg | ORAL_TABLET | Freq: Every day | ORAL | 0 refills | Status: DC

## 2020-08-23 NOTE — Telephone Encounter (Signed)
Ok to split medication.  Thank you, Herb Grays

## 2020-09-27 ENCOUNTER — Ambulatory Visit: Payer: Medicare HMO

## 2020-10-04 ENCOUNTER — Telehealth: Payer: Self-pay | Admitting: Physician Assistant

## 2020-10-04 ENCOUNTER — Other Ambulatory Visit: Payer: Self-pay

## 2020-10-04 ENCOUNTER — Encounter: Payer: Self-pay | Admitting: Nurse Practitioner

## 2020-10-04 ENCOUNTER — Ambulatory Visit (INDEPENDENT_AMBULATORY_CARE_PROVIDER_SITE_OTHER): Payer: Medicare HMO | Admitting: Nurse Practitioner

## 2020-10-04 VITALS — BP 110/71 | HR 87 | Temp 99.5°F | Ht 66.0 in | Wt 191.8 lb

## 2020-10-04 DIAGNOSIS — R110 Nausea: Secondary | ICD-10-CM | POA: Insufficient documentation

## 2020-10-04 DIAGNOSIS — R062 Wheezing: Secondary | ICD-10-CM | POA: Diagnosis not present

## 2020-10-04 DIAGNOSIS — Z8616 Personal history of COVID-19: Secondary | ICD-10-CM | POA: Diagnosis not present

## 2020-10-04 DIAGNOSIS — J0140 Acute pansinusitis, unspecified: Secondary | ICD-10-CM

## 2020-10-04 DIAGNOSIS — J069 Acute upper respiratory infection, unspecified: Secondary | ICD-10-CM | POA: Insufficient documentation

## 2020-10-04 DIAGNOSIS — U071 COVID-19: Secondary | ICD-10-CM

## 2020-10-04 MED ORDER — AZITHROMYCIN 250 MG PO TABS: ORAL_TABLET | ORAL | 0 refills | Status: DC

## 2020-10-04 MED ORDER — METHYLPREDNISOLONE ACETATE 40 MG/ML IJ SUSP
40.0000 mg | Freq: Once | INTRAMUSCULAR | Status: AC
  Administered 2020-10-04: 40 mg via INTRAMUSCULAR

## 2020-10-04 MED ORDER — ONDANSETRON HCL 4 MG PO TABS: 4.0000 mg | ORAL_TABLET | Freq: Three times a day (TID) | ORAL | 0 refills | Status: DC | PRN

## 2020-10-04 MED ORDER — ALBUTEROL SULFATE HFA 108 (90 BASE) MCG/ACT IN AERS: 2.0000 | INHALATION_SPRAY | Freq: Four times a day (QID) | RESPIRATORY_TRACT | 0 refills | Status: DC | PRN

## 2020-10-04 NOTE — Telephone Encounter (Signed)
Patient has tested positive for COVID. Patient has congestion, headache, no appetite, very dry mouth, having trouble sleeping. Please advise. Thanks

## 2020-10-04 NOTE — Patient Instructions (Signed)

## 2020-10-04 NOTE — Telephone Encounter (Signed)
Per Herb Grays and [place] please add to [employer] schedule at 3pm today. Pt is awareAS, CMA

## 2020-10-04 NOTE — Progress Notes (Addendum)
Established Patient Office Visit  The patient presents for acute visit.   Subjective:  Patient ID: Cindy Figueroa, female    DOB: 02/25/1951  Age: 70 y.o. MRN: [id number]  CC:  Chief Complaint  Patient presents with  . Covid Positive    HPI Cindy Figueroa presents for acute visit. Today, she is c/o nasal congestion, headache, low grade fever, and decreased appetite. she states that her cough is sometimes productive. She has also had some intermittent nausea. Her appetite is decreased. Has lost approximately 15 pounds since the onset of her symptoms. She has noted some wheezing. She denies shortness of breath. She states that her symptoms started about 10 days ago. Symptoms started off very mild. Had home test for COVID 19 on 09/25/2020 which was positive. She has been self-isolating ever since. She has been taking OTC Alka-Seltzer Day and Night time symptom treatment. These medications helped for a little while, but would return after medication wore off.  She has not had any vaccination for COVID 19.   Past Medical History:  Diagnosis Date  . Hypertension     History reviewed. No pertinent surgical history.  Family History  Problem Relation Age of Onset  . Hyperlipidemia Mother   . Hypertension Mother   . Cancer Father   . Emphysema Father     Social History   Socioeconomic History  . Marital status: Married    Spouse name: Not on file  . Number of children: Not on file  . Years of education: Not on file  . Highest education level: Not on file  Occupational History  . Not on file  Tobacco Use  . Smoking status: Never Smoker  . Smokeless tobacco: Never Used  Vaping Use  . Vaping Use: Never used  Substance and Sexual Activity  . Alcohol use: No  . Drug use: No  . Sexual activity: Not on file  Other Topics Concern  . Not on file  Social History Narrative  . Not on file   Social Determinants of Health   Financial Resource Strain: Not on file  Food  Insecurity: Not on file  Transportation Needs: Not on file  Physical Activity: Not on file  Stress: Not on file  Social Connections: Not on file  Intimate Partner Violence: Not on file    Outpatient Medications Prior to Visit  Medication Sig Dispense Refill  . Acetaminophen-Codeine 300-30 MG tablet Take 1 tablet by mouth daily as needed for pain. 25 tablet 0  . Biotin 5000 MCG CAPS Take 1 capsule by mouth daily.    . hydrochlorothiazide (HYDRODIURIL) 25 MG tablet Take 1 tablet (25 mg total) by mouth daily. 90 tablet 0  . losartan (COZAAR) 100 MG tablet Take 1 tablet (100 mg total) by mouth daily. 90 tablet 0  . losartan-hydrochlorothiazide (HYZAAR) 100-25 MG tablet Take 1 tablet by mouth daily. 90 tablet 0  . morphine (MSIR) 15 MG tablet Take 0.5 tablets (7.5 mg total) by mouth every 4 (four) hours as needed for severe pain. 7 tablet 0  . UNABLE TO FIND Take 1 capsule by mouth 3 (three) times daily. Med Name: Cardio Chelate with EDTA    . apixaban (ELIQUIS) 5 MG TABS tablet Take 1 tablet (5 mg total) by mouth 2 (two) times daily. (Patient not taking: No sig reported) 90 tablet 0   No facility-administered medications prior to visit.    Allergies  Allergen Reactions  . Penicillins   . Plavix [Clopidogrel Bisulfate] Hives  ROS Review of Systems  Constitutional: Positive for activity change, appetite change, fatigue, fever and unexpected weight change.       Patient has lost approximately 15 pounds since onset of symptoms.   HENT: Positive for congestion, postnasal drip, rhinorrhea, sinus pressure and sinus pain. Negative for sore throat.   Eyes: Negative.   Respiratory: Positive for cough and wheezing. Negative for chest tightness and shortness of breath.   Cardiovascular: Negative for chest pain and palpitations.  Gastrointestinal: Positive for nausea. Negative for vomiting.  Endocrine: Negative.   Musculoskeletal: Positive for arthralgias and myalgias.       Generalized body  aches and muscle pain.   Allergic/Immunologic: Negative for environmental allergies.  Neurological: Positive for headaches.  Hematological: Negative.   Psychiatric/Behavioral: Positive for sleep disturbance.      Objective:    Physical Exam Vitals and nursing note reviewed.  Constitutional:      Appearance: She is well-developed and well-nourished. She is ill-appearing.  HENT:     Head: Normocephalic.     Nose: Congestion present.     Right Sinus: Frontal sinus tenderness present.     Left Sinus: Frontal sinus tenderness present.  Eyes:     Pupils: Pupils are equal, round, and reactive to light.  Cardiovascular:     Rate and Rhythm: Normal rate and regular rhythm.     Pulses: Normal pulses.     Heart sounds: Normal heart sounds.  Pulmonary:     Effort: Pulmonary effort is normal.     Breath sounds: Wheezing present.  Abdominal:     Palpations: Abdomen is soft.     Tenderness: There is no abdominal tenderness.  Musculoskeletal:        General: Normal range of motion.     Cervical back: Normal range of motion and neck supple.  Lymphadenopathy:     Cervical: No cervical adenopathy.  Skin:    General: Skin is warm and dry.  Neurological:     General: No focal deficit present.     Mental Status: She is alert and oriented to person, place, and time.  Psychiatric:        Mood and Affect: Mood and affect normal.     Today's Vitals   10/04/20 1550  BP: 110/71  Pulse: 87  Temp: 99.5 F (37.5 C)  SpO2: 97%  Weight: 191 lb 12.8 oz (87 kg)  Height: 5\' 6"  (1.676 m)   Body mass index is 30.96 kg/m.    Wt Readings from Last 3 Encounters:  10/04/20 191 lb 12.8 oz (87 kg)  08/19/20 197 lb 8 oz (89.6 kg)  07/20/20 200 lb 11.2 oz (91 kg)     Health Maintenance Due  Topic Date Due  . Hepatitis C Screening  Never done  . COVID-19 Vaccine (1) Never done  . DEXA SCAN  Never done  . PNA vac Low Risk Adult (1 of 2 - PCV13) Never done  . INFLUENZA VACCINE  Never done     There are no preventive care reminders to display for this patient.  Lab Results  Component Value Date   TSH 0.753 07/20/2020   Lab Results  Component Value Date   WBC 6.9 07/20/2020   HGB 13.5 07/20/2020   HCT 42.1 07/20/2020   MCV 88 07/20/2020   PLT 310 07/20/2020   Lab Results  Component Value Date   NA 141 07/20/2020   K 3.9 07/20/2020   CO2 24 07/20/2020   GLUCOSE 91 07/20/2020  BUN 11 07/20/2020   CREATININE 0.52 (L) 07/20/2020   BILITOT 0.6 07/20/2020   ALKPHOS 87 07/20/2020   AST 10 07/20/2020   ALT 12 07/20/2020   PROT 6.1 07/20/2020   ALBUMIN 4.4 07/20/2020   CALCIUM 9.6 07/20/2020   Lab Results  Component Value Date   CHOL 185 07/20/2020   Lab Results  Component Value Date   HDL 50 07/20/2020   Lab Results  Component Value Date   LDLCALC 121 (H) 07/20/2020   Lab Results  Component Value Date   TRIG 75 07/20/2020   Lab Results  Component Value Date   CHOLHDL 3.7 07/20/2020   Lab Results  Component Value Date   HGBA1C 5.1 07/20/2020      Assessment & Plan:  1. Acute non-recurrent pansinusitis Start z-pack. Take as directed for 5 days. Rest and increase fluids. Continue use OTC medications as needed and as indicated to improve acute symptoms.  - azithromycin (ZITHROMAX) 250 MG tablet; 1st day take 2 tabs by mouth, then take 1 tab daily until medication complete.  Dispense: 6 tablet; Refill: 0  2. History of COVID-19 Positive home test for COVID 19 on 09/25/2020. Contributing cause to current acute sinusitis. Treat with z-pack and supportive measures. She should continue to self-isolate until symptoms begin to improve.   3. Wheezing Depo-medrol 40mg  injection administered in the office today. Also add rescue inhaler. May use two puffs up to four times daily as needed for wheezing and shortness of breath. Advised patient to seek emergency care if wheezing or shortnes of breath becomes more severe or is persistent despite treatment with  rescue inhaler. The patient voiced understanding and agreement with this plan.  - albuterol (VENTOLIN HFA) 108 (90 Base) MCG/ACT inhaler; Inhale 2 puffs into the lungs every 6 (six) hours as needed for wheezing or shortness of breath.  Dispense: 8 g; Refill: 0 - methylPREDNISolone acetate (DEPO-MEDROL) injection 40 mg  4. Nausea May take zofran 4mg  up to three times daily as needed for nausea. The 'BRAT' diet is suggested, then progress to diet as tolerated as symptoms abate.  Encouraged her to increase oral intake, especially fluids. Advance diet as tolerated.  - ondansetron (ZOFRAN) 4 MG tablet; Take 1 tablet (4 mg total) by mouth every 8 (eight) hours as needed for nausea or vomiting.  Dispense: 20 tablet; Refill: 0  Problem List Items Addressed This Visit      Respiratory   Upper respiratory tract infection due to COVID-19 virus - Primary   Relevant Medications   albuterol (VENTOLIN HFA) 108 (90 Base) MCG/ACT inhaler   azithromycin (ZITHROMAX) 250 MG tablet   ondansetron (ZOFRAN) 4 MG tablet     Other   Wheezing   Nausea   History of COVID-19      Meds ordered this encounter  Medications  . albuterol (VENTOLIN HFA) 108 (90 Base) MCG/ACT inhaler    Sig: Inhale 2 puffs into the lungs every 6 (six) hours as needed for wheezing or shortness of breath.    Dispense:  8 g    Refill:  0  . azithromycin (ZITHROMAX) 250 MG tablet    Sig: 1st day take 2 tabs by mouth, then take 1 tab daily until medication complete.    Dispense:  6 tablet    Refill:  0  . ondansetron (ZOFRAN) 4 MG tablet    Sig: Take 1 tablet (4 mg total) by mouth every 8 (eight) hours as needed for nausea or vomiting.  Dispense:  20 tablet    Refill:  0  . methylPREDNISolone acetate (DEPO-MEDROL) injection 40 mg   Time spent with patient was approximately 25 minutes and included reviewing progress notes, labs, imaging studies, and discussing plan for follow up.   Follow-up: Return if symptoms worsen or fail to  improve.    Ronnell Freshwater, NP

## 2020-10-05 ENCOUNTER — Telehealth: Payer: Self-pay | Admitting: Physician Assistant

## 2020-10-05 NOTE — Telephone Encounter (Signed)
Patient advised per Herb Grays ok to use mucinex for 1-2 days. Patient advised to start taking antibiotic and using albuterol PRN. Patient verbalized understanding. AS, CMA

## 2020-10-05 NOTE — Telephone Encounter (Signed)
Patient and her husband were in seen in office yesterday and prescribed medications and has some questions about them. They are albuterol, azithromycin. Patient says for azithromycin r it can cause diarrhea and is worried about that, patient has not started that medication. Her daughter picked up mucinex for cold and flu she would like to know if if will interfere with patients blood pressure medication or any of those she was prescribed previously.

## 2020-10-06 DIAGNOSIS — Z8616 Personal history of COVID-19: Secondary | ICD-10-CM | POA: Insufficient documentation

## 2020-10-07 ENCOUNTER — Telehealth: Payer: Self-pay | Admitting: Physician Assistant

## 2020-10-07 NOTE — Telephone Encounter (Signed)
Spoke with Patient who declines Dexa scan. Will not order per patient preference.

## 2020-11-15 ENCOUNTER — Other Ambulatory Visit: Payer: Self-pay

## 2020-11-15 ENCOUNTER — Ambulatory Visit
Admission: RE | Admit: 2020-11-15 | Discharge: 2020-11-15 | Disposition: A | Discharge status: Home / Self Care | Payer: Medicare HMO | Source: Ambulatory Visit | Attending: Physician Assistant | Admitting: Physician Assistant

## 2020-11-15 DIAGNOSIS — Z1231 Encounter for screening mammogram for malignant neoplasm of breast: Secondary | ICD-10-CM | POA: Diagnosis not present

## 2020-11-16 ENCOUNTER — Other Ambulatory Visit: Payer: Self-pay | Admitting: Physician Assistant

## 2020-11-17 ENCOUNTER — Telehealth: Payer: Self-pay | Admitting: Physician Assistant

## 2020-11-17 DIAGNOSIS — Z780 Asymptomatic menopausal state: Secondary | ICD-10-CM

## 2020-11-17 DIAGNOSIS — E2839 Other primary ovarian failure: Secondary | ICD-10-CM

## 2020-11-17 NOTE — Telephone Encounter (Signed)
Per Herb Grays ok to order Dexa scan. Order placed. AS, CMA

## 2020-11-22 IMAGING — CR DG HUMERUS 2V *L*
2 series · 2 of 2 positions shown · non-contrast
Comparison: None.

CLINICAL DATA: Fall.  Left upper arm deformity

EXAM:
LEFT HUMERUS - 2+ VIEW

[humerus ap]
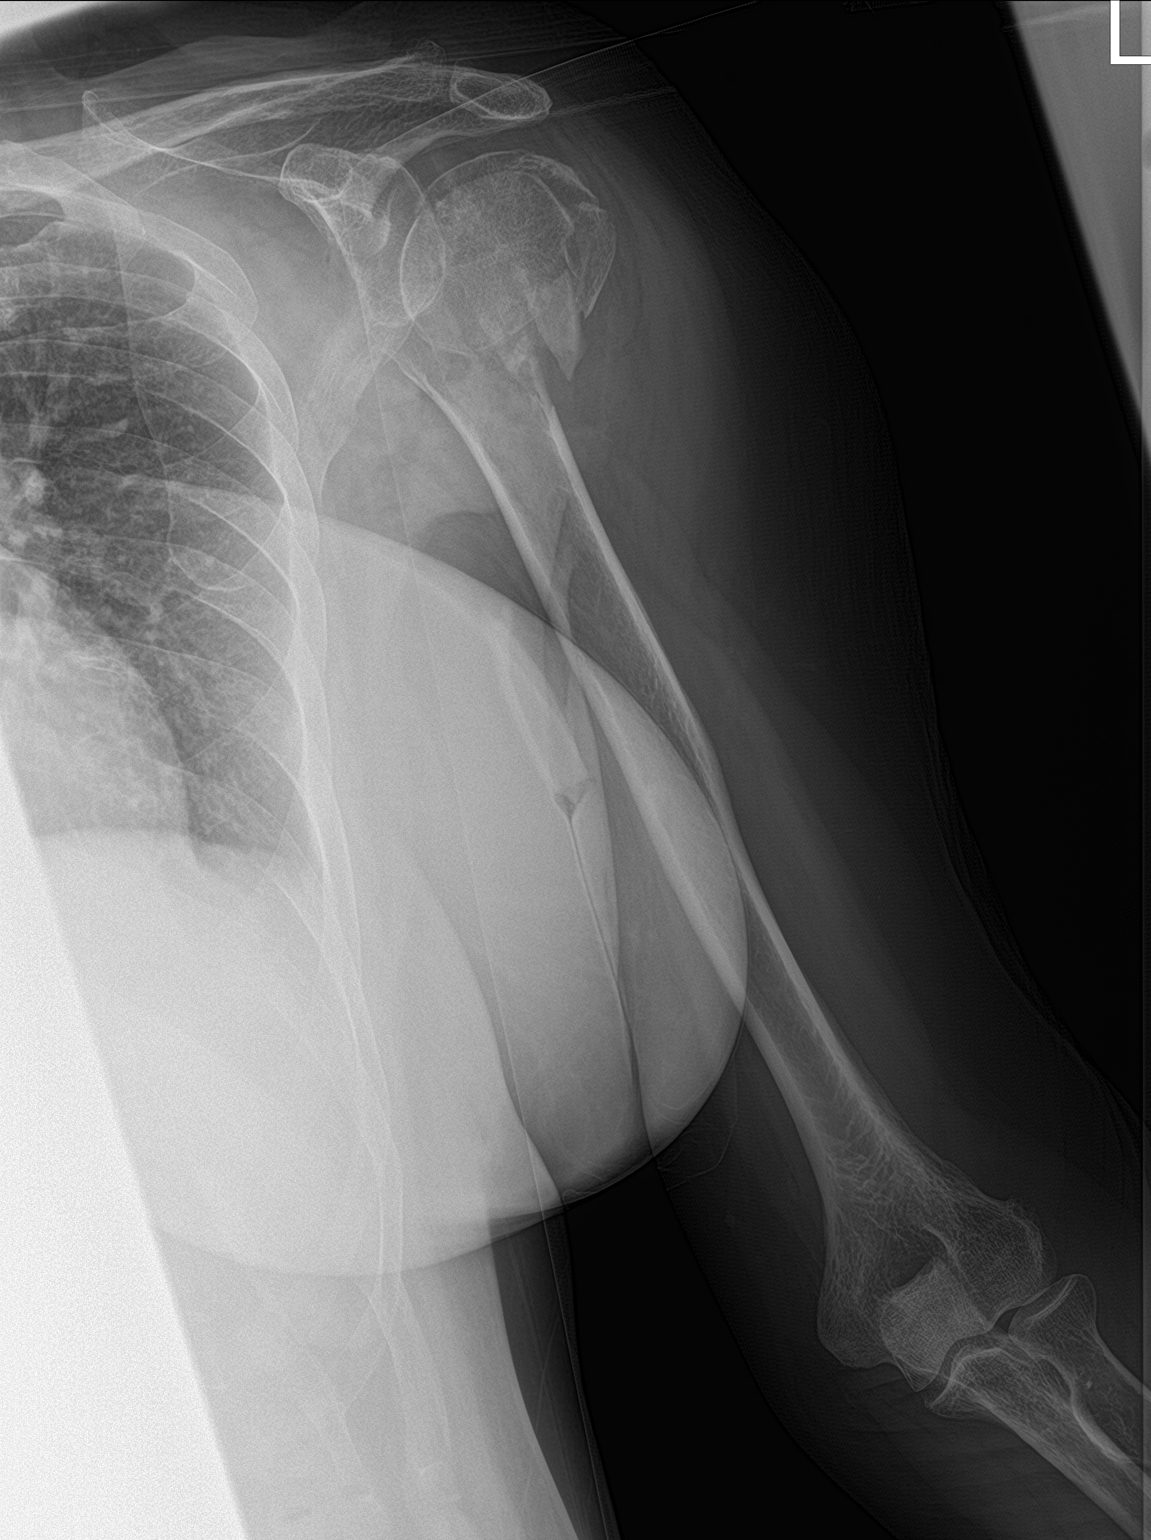

[humerus lat]
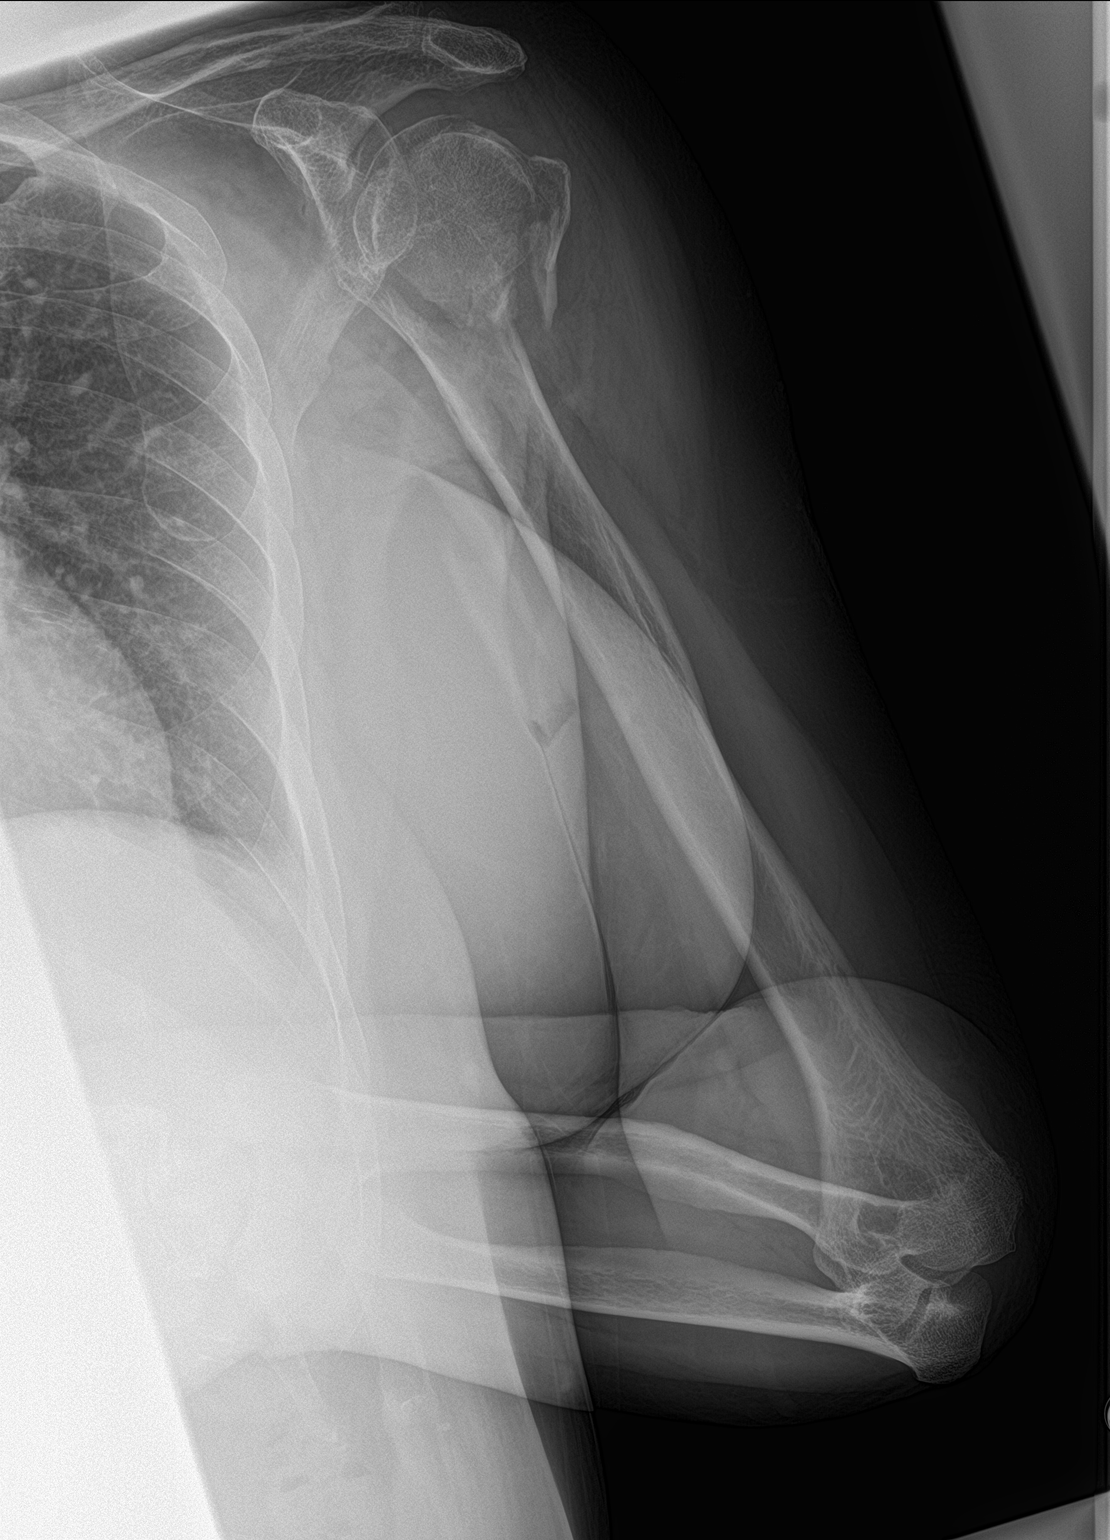

[2 of 2 positions shown; findings below may reference images not displayed]

FINDINGS: There is a comminuted fracture through the left humeral neck and
greater tuberosity. Mild displacement of fracture fragments. No
subluxation or dislocation.
IMPRESSION: Comminuted, displaced left humeral neck fracture and fracture
through the greater tuberosity.

## 2020-12-09 ENCOUNTER — Other Ambulatory Visit: Payer: Self-pay | Admitting: Physician Assistant

## 2020-12-09 DIAGNOSIS — E785 Hyperlipidemia, unspecified: Secondary | ICD-10-CM

## 2020-12-09 DIAGNOSIS — I10 Essential (primary) hypertension: Secondary | ICD-10-CM

## 2020-12-09 DIAGNOSIS — Z0000 Encounter for general adult medical examination without abnormal findings: Secondary | ICD-10-CM

## 2020-12-14 ENCOUNTER — Other Ambulatory Visit: Payer: Medicare HMO

## 2020-12-14 ENCOUNTER — Other Ambulatory Visit: Payer: Self-pay

## 2020-12-14 DIAGNOSIS — Z0000 Encounter for general adult medical examination without abnormal findings: Secondary | ICD-10-CM

## 2020-12-14 DIAGNOSIS — E785 Hyperlipidemia, unspecified: Secondary | ICD-10-CM

## 2020-12-14 DIAGNOSIS — I10 Essential (primary) hypertension: Secondary | ICD-10-CM | POA: Diagnosis not present

## 2020-12-15 LAB — COMPREHENSIVE METABOLIC PANEL
ALT: 13 IU/L (ref 0–32)
AST: 14 IU/L (ref 0–40)
Albumin/Globulin Ratio: 2.6 — ABNORMAL HIGH (ref 1.2–2.2)
Albumin: 4.5 g/dL (ref 3.8–4.8)
Alkaline Phosphatase: 77 IU/L (ref 44–121)
BUN/Creatinine Ratio: 20 (ref 12–28)
BUN: 13 mg/dL (ref 8–27)
Bilirubin Total: 0.7 mg/dL (ref 0.0–1.2)
CO2: 22 mmol/L (ref 20–29)
Calcium: 9.4 mg/dL (ref 8.7–10.3)
Chloride: 104 mmol/L (ref 96–106)
Creatinine, Ser: 0.64 mg/dL (ref 0.57–1.00)
Globulin, Total: 1.7 g/dL (ref 1.5–4.5)
Glucose: 107 mg/dL — ABNORMAL HIGH (ref 65–99)
Potassium: 4 mmol/L (ref 3.5–5.2)
Sodium: 141 mmol/L (ref 134–144)
Total Protein: 6.2 g/dL (ref 6.0–8.5)
eGFR: 95 mL/min/{1.73_m2} (ref >59)

## 2020-12-15 LAB — LIPID PANEL
Chol/HDL Ratio: 3.5 ratio (ref 0.0–4.4)
Cholesterol, Total: 185 mg/dL (ref 100–199)
HDL: 53 mg/dL (ref >39)
LDL Chol Calc (NIH): 121 mg/dL — ABNORMAL HIGH (ref 0–99)
Triglycerides: 55 mg/dL (ref 0–149)
VLDL Cholesterol Cal: 11 mg/dL (ref 5–40)

## 2020-12-15 LAB — TSH: TSH: 1.27 u[IU]/mL (ref 0.450–4.500)

## 2020-12-15 LAB — CBC
Hematocrit: 38.6 % (ref 34.0–46.6)
Hemoglobin: 12.9 g/dL (ref 11.1–15.9)
MCH: 28.8 pg (ref 26.6–33.0)
MCHC: 33.4 g/dL (ref 31.5–35.7)
MCV: 86 fL (ref 79–97)
Platelets: 305 10*3/uL (ref 150–450)
RBC: 4.48 x10E6/uL (ref 3.77–5.28)
RDW: 14.2 % (ref 11.7–15.4)
WBC: 6.3 10*3/uL (ref 3.4–10.8)

## 2020-12-15 LAB — HEMOGLOBIN A1C
Est. average glucose Bld gHb Est-mCnc: 103 mg/dL
Hgb A1c MFr Bld: 5.2 % (ref 4.8–5.6)

## 2020-12-16 ENCOUNTER — Other Ambulatory Visit: Payer: Self-pay

## 2020-12-16 ENCOUNTER — Encounter: Payer: Self-pay | Admitting: Orthopedic Surgery

## 2020-12-16 ENCOUNTER — Ambulatory Visit (INDEPENDENT_AMBULATORY_CARE_PROVIDER_SITE_OTHER): Payer: Medicare HMO

## 2020-12-16 ENCOUNTER — Ambulatory Visit: Payer: Medicare HMO | Admitting: Orthopedic Surgery

## 2020-12-16 DIAGNOSIS — S42202A Unspecified fracture of upper end of left humerus, initial encounter for closed fracture: Secondary | ICD-10-CM

## 2020-12-16 DIAGNOSIS — M25512 Pain in left shoulder: Secondary | ICD-10-CM

## 2020-12-16 DIAGNOSIS — G8929 Other chronic pain: Secondary | ICD-10-CM

## 2020-12-16 NOTE — Progress Notes (Signed)
Office Visit Note   Patient: Cindy Figueroa           Date of Birth: Jun 15, 1951           MRN: [id number] Visit Date: 12/16/2020 Requested by: Lorrene Reid, PA-C [place],  [place] [id number] PCP: Lorrene Reid, PA-C  Subjective: Chief Complaint  Patient presents with  . Left Shoulder - Follow-up    HPI: Cindy Figueroa is a 70 y.o. female who presents to the office for follow-up of left proximal humerus fracture.  Date of injury was 06/14/2020.  She reports she is doing well and she has been doing home exercise program 1-2 times per day and using a shoulder pulley at home.  She has not really been lift anything with the left arm.  She feels 75% better compared with last visit in January.  She is sleeping through the night without any pain.  Not taking any medications for pain.  She has no restrictions with the daily activities she does and she is able to do her hair without much difficulty..                ROS: All systems reviewed are negative as they relate to the chief complaint within the history of present illness.  Patient denies fevers or chills.  Assessment & Plan: Visit Diagnoses:  1. Closed fracture of proximal end of left humerus, unspecified fracture morphology, initial encounter   2. Chronic left shoulder pain     Plan: Patient is a 70 year old female who returns for reevaluation following left proximal humerus fracture though sustained on 06/14/2020.  Doing well overall and she has progressed well with a home exercise program.  She has no restrictions that she notices.  Recommended she return to easing into lifting with the left arm and okay to lift as she needs for her household duties.  She has pretty good strength of the rotator cuff on exam and much improved passive motion.  She is able to actively lift her hand above her head and touch her hand to the top of her head.  Radiographs show well-healed proximal humerus fracture with expected  amount of malunion.  Overall she is doing very well and plan to have her follow-up as needed.  Counseled her on the possibility of avascular necrosis in the future and recommended she return the office if she has worsening pain in the left shoulder.  Follow-Up Instructions: No follow-ups on file.   Orders:  Orders Placed This Encounter  Procedures  . XR Shoulder Left   No orders of the defined types were placed in this encounter.     Procedures: No procedures performed   Clinical Data: No additional findings.  Objective: Vital Signs: There were no vitals taken for this visit.  Physical Exam:  Constitutional: Patient appears well-developed HEENT:  Head: Normocephalic Eyes:EOM are normal Neck: Normal range of motion Cardiovascular: Normal rate Pulmonary/chest: Effort normal Neurologic: Patient is alert Skin: Skin is warm Psychiatric: Patient has normal mood and affect  Ortho Exam: Ortho exam demonstrates left shoulder with 40 degrees external rotation, 75 degrees abduction, 95 degrees forward flexion.  Axillary nerve intact with deltoid firing.  Rotator cuff strength intact with good strength of supraspinatus, infraspinatus, subscapularis.  Patient is able to actively internally rotate to about the top of the buttock.  No tenderness throughout the proximal humerus and no significant crepitus with passive motion of the shoulder.  Fracture feels to move as  1 unit.  2+ radial pulse of the left upper extremity.  Radial nerve intact with intact EPL and wrist extension.  Specialty Comments:  No specialty comments available.  Imaging: No results found.   PMFS History: Patient Active Problem List   Diagnosis Date Noted  . History of COVID-19 10/06/2020  . Upper respiratory tract infection due to COVID-19 virus 10/04/2020  . Wheezing 10/04/2020  . Nausea 10/04/2020  . Open wound of left lower leg 01/09/2019  . Pain in right upper arm 12/31/2018  . PTSD (post-traumatic stress  disorder) 10/21/2018  . Anxiety in acute stress reaction 10/21/2018  . Healthcare maintenance 06/06/2018  . Screening for breast cancer 06/06/2018  . Encounter for Medicare annual wellness exam 12/06/2017  . Obesity with body mass index of 30.0-39.9 03/13/2017  . Hyperlipidemia 03/13/2017  . Vitamin D deficiency 01/11/2017  . HTN (hypertension) 12/26/2016  . Poor balance 12/26/2016  . H/O fatigue 12/26/2016  . Family history of high cholesterol 12/26/2016   Past Medical History:  Diagnosis Date  . Hypertension     Family History  Problem Relation Age of Onset  . Hyperlipidemia Mother   . Hypertension Mother   . Cancer Father   . Emphysema Father     No past surgical history on file. Social History   Occupational History  . Not on file  Tobacco Use  . Smoking status: Never Smoker  . Smokeless tobacco: Never Used  Vaping Use  . Vaping Use: Never used  Substance and Sexual Activity  . Alcohol use: No  . Drug use: No  . Sexual activity: Not on file

## 2020-12-17 ENCOUNTER — Ambulatory Visit (INDEPENDENT_AMBULATORY_CARE_PROVIDER_SITE_OTHER): Payer: Medicare HMO | Admitting: Physician Assistant

## 2020-12-17 ENCOUNTER — Encounter: Payer: Self-pay | Admitting: Physician Assistant

## 2020-12-17 VITALS — BP 127/76 | HR 73 | Temp 97.4°F | Ht 66.0 in | Wt 195.4 lb

## 2020-12-17 DIAGNOSIS — I10 Essential (primary) hypertension: Secondary | ICD-10-CM

## 2020-12-17 DIAGNOSIS — E785 Hyperlipidemia, unspecified: Secondary | ICD-10-CM | POA: Diagnosis not present

## 2020-12-17 DIAGNOSIS — E669 Obesity, unspecified: Secondary | ICD-10-CM

## 2020-12-17 MED ORDER — HYDROCHLOROTHIAZIDE 12.5 MG PO TABS: 12.5000 mg | ORAL_TABLET | Freq: Every day | ORAL | 1 refills | Status: DC

## 2020-12-17 NOTE — Progress Notes (Signed)
Established Patient Office Visit  Subjective:  Patient ID: Cindy Figueroa, female    DOB: 11-Mar-1951  Age: 70 y.o. MRN: [id number]  CC:  Chief Complaint  Patient presents with  . Hypertension  . Hyperlipidemia    HPI Cindy Figueroa presents for follow up on hypertension and hyperlipidemia.  HTN: Pt denies chest pain, palpitations, dizziness or lower extremity swelling. Reports edema well controlled. Taking medication as directed without side effects. However, does c/o frequent urination with diuretic. Has not been checking BP at home recently. Pt follows a low salt diet. Tries to stay hydrated.  HLD: Pt continues to follow a vegan diet, eats salmon and vegetables. Patient has history of statin intolerance.  Weight: Reports continues weight loss efforts with monitoring her diet and trying to be active. Has reached a plateau but motivated to continue to work on weight loss.   Past Medical History:  Diagnosis Date  . Hypertension     History reviewed. No pertinent surgical history.  Family History  Problem Relation Age of Onset  . Hyperlipidemia Mother   . Hypertension Mother   . Cancer Father   . Emphysema Father     Social History   Socioeconomic History  . Marital status: Married    Spouse name: Not on file  . Number of children: Not on file  . Years of education: Not on file  . Highest education level: Not on file  Occupational History  . Not on file  Tobacco Use  . Smoking status: Never Smoker  . Smokeless tobacco: Never Used  Vaping Use  . Vaping Use: Never used  Substance and Sexual Activity  . Alcohol use: No  . Drug use: No  . Sexual activity: Not on file  Other Topics Concern  . Not on file  Social History Narrative  . Not on file   Social Determinants of Health   Financial Resource Strain: Not on file  Food Insecurity: Not on file  Transportation Needs: Not on file  Physical Activity: Not on file  Stress: Not on file  Social  Connections: Not on file  Intimate Partner Violence: Not on file    Outpatient Medications Prior to Visit  Medication Sig Dispense Refill  . Biotin 5000 MCG CAPS Take 1 capsule by mouth daily.    Marland Kitchen losartan (COZAAR) 100 MG tablet TAKE 1 TABLET BY MOUTH EVERY DAY 90 tablet 0  . ondansetron (ZOFRAN) 4 MG tablet Take 1 tablet (4 mg total) by mouth every 8 (eight) hours as needed for nausea or vomiting. 20 tablet 0  . hydrochlorothiazide (HYDRODIURIL) 25 MG tablet TAKE 1 TABLET (25 MG TOTAL) BY MOUTH DAILY. 90 tablet 0  . UNABLE TO FIND Take 1 capsule by mouth 3 (three) times daily. Med Name: Cardio Chelate with EDTA (Patient not taking: Reported on 12/17/2020)    . Acetaminophen-Codeine 300-30 MG tablet Take 1 tablet by mouth daily as needed for pain. (Patient not taking: Reported on 12/17/2020) 25 tablet 0  . albuterol (VENTOLIN HFA) 108 (90 Base) MCG/ACT inhaler Inhale 2 puffs into the lungs every 6 (six) hours as needed for wheezing or shortness of breath. (Patient not taking: Reported on 12/17/2020) 8 g 0  . azithromycin (ZITHROMAX) 250 MG tablet 1st day take 2 tabs by mouth, then take 1 tab daily until medication complete. (Patient not taking: Reported on 12/17/2020) 6 tablet 0  . losartan-hydrochlorothiazide (HYZAAR) 100-25 MG tablet Take 1 tablet by mouth daily. (Patient not taking: Reported on  12/17/2020) 90 tablet 0  . morphine (MSIR) 15 MG tablet Take 0.5 tablets (7.5 mg total) by mouth every 4 (four) hours as needed for severe pain. (Patient not taking: Reported on 12/17/2020) 7 tablet 0   No facility-administered medications prior to visit.    Allergies  Allergen Reactions  . Penicillins   . Plavix [Clopidogrel Bisulfate] Hives    ROS Review of Systems A fourteen system review of systems was performed and found to be positive as per HPI.   Objective:    Physical Exam General:  Well Developed, well nourished, in no acute distress  Neuro:  Alert and oriented,  extra-ocular muscles intact   HEENT:  Normocephalic, atraumatic, neck supple Skin:  no gross rash, warm, pink. Cardiac:  RRR, S1 S2 wnl's  Respiratory:  ECTA B/L w/o wheezing, crackles or rales, Not using accessory muscles, speaking in full sentences- unlabored. Vascular:  Ext warm, no cyanosis apprec.; cap RF less 2 sec. No edema  Psych:  No HI/SI, judgement and insight good, Euthymic mood. Full Affect.   BP 127/76   Pulse 73   Temp (!) 97.4 F (36.3 C)   Ht 5' 6"  (1.676 m)   Wt 195 lb 6.4 oz (88.6 kg)   SpO2 97%   BMI 31.54 kg/m  Wt Readings from Last 3 Encounters:  12/17/20 195 lb 6.4 oz (88.6 kg)  10/04/20 191 lb 12.8 oz (87 kg)  08/19/20 197 lb 8 oz (89.6 kg)     Health Maintenance Due  Topic Date Due  . COVID-19 Vaccine (1) Never done  . Hepatitis C Screening  Never done  . DEXA SCAN  Never done  . PNA vac Low Risk Adult (1 of 2 - PCV13) Never done    There are no preventive care reminders to display for this patient.  Lab Results  Component Value Date   TSH 1.270 12/14/2020   Lab Results  Component Value Date   WBC 6.3 12/14/2020   HGB 12.9 12/14/2020   HCT 38.6 12/14/2020   MCV 86 12/14/2020   PLT 305 12/14/2020   Lab Results  Component Value Date   NA 141 12/14/2020   K 4.0 12/14/2020   CO2 22 12/14/2020   GLUCOSE 107 (H) 12/14/2020   BUN 13 12/14/2020   CREATININE 0.64 12/14/2020   BILITOT 0.7 12/14/2020   ALKPHOS 77 12/14/2020   AST 14 12/14/2020   ALT 13 12/14/2020   PROT 6.2 12/14/2020   ALBUMIN 4.5 12/14/2020   CALCIUM 9.4 12/14/2020   EGFR 95 12/14/2020   Lab Results  Component Value Date   CHOL 185 12/14/2020   Lab Results  Component Value Date   HDL 53 12/14/2020   Lab Results  Component Value Date   LDLCALC 121 (H) 12/14/2020   Lab Results  Component Value Date   TRIG 55 12/14/2020   Lab Results  Component Value Date   CHOLHDL 3.5 12/14/2020   Lab Results  Component Value Date   HGBA1C 5.2 12/14/2020      Assessment & Plan:   Problem  List Items Addressed This Visit      Cardiovascular and Mediastinum   HTN (hypertension)   Relevant Medications   hydrochlorothiazide (HYDRODIURIL) 12.5 MG tablet     Other   Obesity with body mass index of 30.0-39.9   Hyperlipidemia - Primary   Relevant Medications   hydrochlorothiazide (HYDRODIURIL) 12.5 MG tablet     Hypertension: -Controlled.  -Discussed with patient will decreased HCTZ to  12.5 mg to help reduce urinary frequency and recommend to monitor BP more closely at home. Advised if edema becomes uncontrolled then recommend to resume 25 mg. Patient verbalized understanding. -Continue with low sodium diet and good hydration. -Recent CMP, renal function and electrolytes wnl's. -Will continue to monitor.  Hyperlipidemia: -Recent lipid panel: total cholesterol 185, triglycerides 55, HDL 53, LDL 121  -Patient continues to decline statin therapy due to hx of statin intolerance and prefers to continue with dietary and lifestyle modifications. -Will continue to monitor.   Obesity with body mass index of 30.0-39.9: -Associated with hypertension and hyperlipemia. -Encourage to continue weight loss efforts.  -Recent A1c, TSH and CBC wnls.  Meds ordered this encounter  Medications  . hydrochlorothiazide (HYDRODIURIL) 12.5 MG tablet    Sig: Take 1 tablet (12.5 mg total) by mouth daily.    Dispense:  90 tablet    Refill:  1    Order Specific Question:   Supervising Provider    Answer:   Beatrice Lecher D [[id number]]    Follow-up: Return in about 6 months (around 06/19/2021) for [facility] with [facility] few days before.    Lorrene Reid, PA-C

## 2020-12-17 NOTE — Patient Instructions (Signed)

## 2020-12-18 ENCOUNTER — Encounter: Payer: Self-pay | Admitting: Orthopedic Surgery

## 2021-01-27 DIAGNOSIS — M5416 Radiculopathy, lumbar region: Secondary | ICD-10-CM | POA: Diagnosis not present

## 2021-01-27 DIAGNOSIS — M545 Low back pain, unspecified: Secondary | ICD-10-CM | POA: Diagnosis not present

## 2021-02-01 DIAGNOSIS — M5416 Radiculopathy, lumbar region: Secondary | ICD-10-CM | POA: Diagnosis not present

## 2021-02-12 ENCOUNTER — Other Ambulatory Visit: Payer: Self-pay | Admitting: Physician Assistant

## 2021-02-15 DIAGNOSIS — L820 Inflamed seborrheic keratosis: Secondary | ICD-10-CM | POA: Diagnosis not present

## 2021-02-15 DIAGNOSIS — L905 Scar conditions and fibrosis of skin: Secondary | ICD-10-CM | POA: Diagnosis not present

## 2021-02-23 DIAGNOSIS — M5416 Radiculopathy, lumbar region: Secondary | ICD-10-CM | POA: Diagnosis not present

## 2021-02-23 DIAGNOSIS — Z6831 Body mass index (BMI) 31.0-31.9, adult: Secondary | ICD-10-CM | POA: Diagnosis not present

## 2021-02-24 ENCOUNTER — Ambulatory Visit (INDEPENDENT_AMBULATORY_CARE_PROVIDER_SITE_OTHER): Payer: Medicare HMO | Admitting: Nurse Practitioner

## 2021-02-24 ENCOUNTER — Encounter: Payer: Self-pay | Admitting: Nurse Practitioner

## 2021-02-24 ENCOUNTER — Other Ambulatory Visit: Payer: Self-pay

## 2021-02-24 ENCOUNTER — Telehealth: Payer: Self-pay | Admitting: Physician Assistant

## 2021-02-24 ENCOUNTER — Ambulatory Visit: Payer: Medicare HMO | Admitting: Nurse Practitioner

## 2021-02-24 VITALS — BP 120/71 | HR 80 | Temp 97.3°F | Ht 66.0 in | Wt 205.1 lb

## 2021-02-24 DIAGNOSIS — T148XXA Other injury of unspecified body region, initial encounter: Secondary | ICD-10-CM

## 2021-02-24 DIAGNOSIS — L03116 Cellulitis of left lower limb: Secondary | ICD-10-CM

## 2021-02-24 DIAGNOSIS — L02416 Cutaneous abscess of left lower limb: Secondary | ICD-10-CM | POA: Diagnosis not present

## 2021-02-24 MED ORDER — MUPIROCIN 2 % EX OINT: TOPICAL_OINTMENT | CUTANEOUS | 1 refills | Status: DC

## 2021-02-24 MED ORDER — SULFAMETHOXAZOLE-TRIMETHOPRIM 800-160 MG PO TABS: 1.0000 | ORAL_TABLET | Freq: Two times a day (BID) | ORAL | 0 refills | Status: DC

## 2021-02-24 NOTE — Progress Notes (Signed)
Established Patient Office Visit  Subjective:  Patient ID: Cindy Figueroa, female    DOB: June 18, 1951  Age: 70 y.o. MRN: [id number]  CC:  Chief Complaint  Patient presents with   Leg Injury    HPI Cindy Figueroa presents for evaluation of traumatic abrasion to her left shin.  Patient states that she fell on her deck at home last Saturday, 02/19/2021.  She states she fell off of a stepladder scraping her shin on the bottom step.  She states that initially it did not give her much of a problem.  Was just tender and bruised.  It has now started to develop bruising, redness, and warmth.  There are several small scabbed areas in a linear presentation along the shin.  Lesions are scabbed with small amount of clear drainage present.  She states that she is not doing keeping this area clean with Dial soap and warm water.  She has been applying Neosporin to each open area.  She has been keeping it covered with a clean and dry dressing.  She states this is very tender.  She is concerned that infection has developed.  She denies fever, chills, or body aches.  She denies nausea, vomiting, or dizziness.  Past Medical History:  Diagnosis Date   Hypertension     History reviewed. No pertinent surgical history.  Family History  Problem Relation Age of Onset   Hyperlipidemia Mother    Hypertension Mother    Cancer Father    Emphysema Father     Social History   Socioeconomic History   Marital status: Married    Spouse name: Not on file   Number of children: Not on file   Years of education: Not on file   Highest education level: Not on file  Occupational History   Not on file  Tobacco Use   Smoking status: Never   Smokeless tobacco: Never  Vaping Use   Vaping Use: Never used  Substance and Sexual Activity   Alcohol use: No   Drug use: No   Sexual activity: Not on file  Other Topics Concern   Not on file  Social History Narrative   Not on file   Social Determinants of Health    Financial Resource Strain: Not on file  Food Insecurity: Not on file  Transportation Needs: Not on file  Physical Activity: Not on file  Stress: Not on file  Social Connections: Not on file  Intimate Partner Violence: Not on file    Outpatient Medications Prior to Visit  Medication Sig Dispense Refill   Biotin 5000 MCG CAPS Take 1 capsule by mouth daily.     hydrochlorothiazide (HYDRODIURIL) 12.5 MG tablet Take 1 tablet (12.5 mg total) by mouth daily. 90 tablet 1   losartan (COZAAR) 100 MG tablet TAKE 1 TABLET BY MOUTH EVERY DAY 90 tablet 0   ondansetron (ZOFRAN) 4 MG tablet Take 1 tablet (4 mg total) by mouth every 8 (eight) hours as needed for nausea or vomiting. 20 tablet 0   No facility-administered medications prior to visit.    Allergies  Allergen Reactions   Penicillins    Plavix [Clopidogrel Bisulfate] Hives    ROS Review of Systems  Constitutional:  Positive for activity change and fatigue. Negative for chills and fever.  HENT:  Negative for congestion, postnasal drip, rhinorrhea, sinus pressure and sinus pain.   Eyes: Negative.   Respiratory:  Negative for cough, chest tightness, shortness of breath and wheezing.   Cardiovascular:  Negative for chest pain and palpitations.  Gastrointestinal:  Negative for constipation, diarrhea, nausea and vomiting.  Endocrine: Negative for cold intolerance, heat intolerance, polydipsia and polyuria.  Genitourinary: Negative.   Musculoskeletal:  Positive for arthralgias. Negative for back pain and myalgias.       Pain along left shin.  Bruising present.  Skin:  Negative for rash.       Several circular, scabbed lesions in linear presentation along the left shin.  There is some clear drainage present.  Lesions are surrounded by redness and are very tender.  Allergic/Immunologic: Negative.   Neurological:  Negative for dizziness, weakness and headaches.  Psychiatric/Behavioral:  The patient is not nervous/anxious.   All other  systems reviewed and are negative.    Objective:    Physical Exam Vitals and nursing note reviewed.  Constitutional:      Appearance: Normal appearance. She is well-developed.  HENT:     Head: Normocephalic and atraumatic.     Nose: Nose normal.     Mouth/Throat:     Mouth: Mucous membranes are moist.  Eyes:     Extraocular Movements: Extraocular movements intact.     Conjunctiva/sclera: Conjunctivae normal.     Pupils: Pupils are equal, round, and reactive to light.  Cardiovascular:     Rate and Rhythm: Normal rate and regular rhythm.     Pulses: Normal pulses.     Heart sounds: Normal heart sounds.  Pulmonary:     Effort: Pulmonary effort is normal.     Breath sounds: Normal breath sounds.  Abdominal:     Palpations: Abdomen is soft.  Musculoskeletal:        General: Tenderness present. Normal range of motion.     Cervical back: Normal range of motion and neck supple.     Comments: Tenderness with bruising present along the left shin.  Lymphadenopathy:     Cervical: No cervical adenopathy.  Skin:    General: Skin is warm and dry.     Capillary Refill: Capillary refill takes less than 2 seconds.          Comments: Took down dressing present upon patient's arrival to the office.  Cleaned the lesions with sterile saline.  Patted dry with clean, dry gauze.  Applied antibiotic ointment to clean dry dressing and redressed wound while patient in the office.  Neurological:     General: No focal deficit present.     Mental Status: She is alert and oriented to person, place, and time.  Psychiatric:        Mood and Affect: Mood normal.        Behavior: Behavior normal.        Thought Content: Thought content normal.        Judgment: Judgment normal.   Today's Vitals   02/24/21 1317  BP: 120/71  Pulse: 80  Temp: (!) 97.3 F (36.3 C)  SpO2: 97%  Weight: 205 lb 1.6 oz (93 kg)  Height: 5' 6"  (1.676 m)   Body mass index is 33.1 kg/m.   Wt Readings from Last 3  Encounters:  03/02/21 202 lb 3.2 oz (91.7 kg)  02/24/21 205 lb 1.6 oz (93 kg)  12/17/20 195 lb 6.4 oz (88.6 kg)     Health Maintenance Due  Topic Date Due   COVID-19 Vaccine (1) Never done   Hepatitis C Screening  Never done   Zoster Vaccines- Shingrix (1 of 2) Never done   DEXA SCAN  Never done   PNA  vac Low Risk Adult (1 of 2 - PCV13) Never done    There are no preventive care reminders to display for this patient.  Lab Results  Component Value Date   TSH 1.270 12/14/2020   Lab Results  Component Value Date   WBC 6.3 12/14/2020   HGB 12.9 12/14/2020   HCT 38.6 12/14/2020   MCV 86 12/14/2020   PLT 305 12/14/2020   Lab Results  Component Value Date   NA 141 12/14/2020   K 4.0 12/14/2020   CO2 22 12/14/2020   GLUCOSE 107 (H) 12/14/2020   BUN 13 12/14/2020   CREATININE 0.64 12/14/2020   BILITOT 0.7 12/14/2020   ALKPHOS 77 12/14/2020   AST 14 12/14/2020   ALT 13 12/14/2020   PROT 6.2 12/14/2020   ALBUMIN 4.5 12/14/2020   CALCIUM 9.4 12/14/2020   EGFR 95 12/14/2020   Lab Results  Component Value Date   CHOL 185 12/14/2020   Lab Results  Component Value Date   HDL 53 12/14/2020   Lab Results  Component Value Date   LDLCALC 121 (H) 12/14/2020   Lab Results  Component Value Date   TRIG 55 12/14/2020   Lab Results  Component Value Date   CHOLHDL 3.5 12/14/2020   Lab Results  Component Value Date   HGBA1C 5.2 12/14/2020      Assessment & Plan:  1. Cellulitis and abscess of left lower extremity Cellulitis developing along several abrasions on left shin.  Start Bactrim DS twice daily for next 10 days.  Advised her to keep lesions clean and dry.  She should continue to gently clean the wounds with warm water and Dial soap.  Pat dry.  Apply clean dry dressing twice daily.  Encouraged her to wrap her left leg when possible. - sulfamethoxazole-trimethoprim (BACTRIM DS) 800-160 MG tablet; Take 1 tablet by mouth 2 (two) times daily.  Dispense: 20 tablet;  Refill: 0  2. Traumatic abrasion May apply Bactroban ointment to clean dry dressing after cleansing the lesions twice daily with Dial soap and warm water and patting dry. - mupirocin ointment (BACTROBAN) 2 %; Apply small amount to effected area twice daily for next 7 days then as needed (Patient not taking: Reported on 03/02/2021)  Dispense: 22 g; Refill: 1   Problem List Items Addressed This Visit       Other   Cellulitis and abscess of left lower extremity - Primary   Relevant Medications   sulfamethoxazole-trimethoprim (BACTRIM DS) 800-160 MG tablet   Traumatic abrasion   Relevant Medications   mupirocin ointment (BACTROBAN) 2 %    Meds ordered this encounter  Medications   mupirocin ointment (BACTROBAN) 2 %    Sig: Apply small amount to effected area twice daily for next 7 days then as needed    Dispense:  22 g    Refill:  1    Order Specific Question:   Supervising Provider    Answer:   Beatrice Lecher D [[id number]]   sulfamethoxazole-trimethoprim (BACTRIM DS) 800-160 MG tablet    Sig: Take 1 tablet by mouth 2 (two) times daily.    Dispense:  20 tablet    Refill:  0    Order Specific Question:   Supervising Provider    Answer:   Beatrice Lecher D [[id number]]    Follow-up: Return for prn worsening or persistent symptoms.    Ronnell Freshwater, NP

## 2021-02-24 NOTE — Telephone Encounter (Signed)
Attempted to contact patient again. Phone line busy.   Called spouses phone, phone line rang then went to a busy signal. AS, CMA

## 2021-02-24 NOTE — Telephone Encounter (Signed)
Attempted to contact patient again. Line remains busy. Canceling held appointment. AS, CMA

## 2021-02-24 NOTE — Telephone Encounter (Signed)
Attempted to contact patient again. No answer. Phone line busy. AS, CMA

## 2021-02-24 NOTE — Telephone Encounter (Signed)
Patient scraped her shin bone on a chair and leg is feverish and hot and infection is coming out. Please advise, thanks.

## 2021-03-02 ENCOUNTER — Encounter: Payer: Self-pay | Admitting: Nurse Practitioner

## 2021-03-02 ENCOUNTER — Other Ambulatory Visit: Payer: Self-pay

## 2021-03-02 ENCOUNTER — Ambulatory Visit (INDEPENDENT_AMBULATORY_CARE_PROVIDER_SITE_OTHER): Payer: Medicare HMO | Admitting: Nurse Practitioner

## 2021-03-02 ENCOUNTER — Ambulatory Visit: Payer: Medicare HMO | Admitting: Nurse Practitioner

## 2021-03-02 VITALS — BP 114/68 | HR 75 | Temp 97.4°F | Ht 66.0 in | Wt 202.2 lb

## 2021-03-02 DIAGNOSIS — L02416 Cutaneous abscess of left lower limb: Secondary | ICD-10-CM

## 2021-03-02 DIAGNOSIS — L03116 Cellulitis of left lower limb: Secondary | ICD-10-CM

## 2021-03-02 DIAGNOSIS — M79605 Pain in left leg: Secondary | ICD-10-CM

## 2021-03-02 DIAGNOSIS — M7989 Other specified soft tissue disorders: Secondary | ICD-10-CM | POA: Diagnosis not present

## 2021-03-02 MED ORDER — CIPROFLOXACIN HCL 500 MG PO TABS: 500.0000 mg | ORAL_TABLET | Freq: Two times a day (BID) | ORAL | 0 refills | Status: DC

## 2021-03-02 NOTE — Progress Notes (Signed)
Established Patient Office Visit  Subjective:  Patient ID: Cindy Figueroa, female    DOB: 1950-11-12  Age: 70 y.o. MRN: [id number]  CC:  Chief Complaint  Patient presents with   Cellulitis     HPI [name] presents for evaluation of cellulitis.  She fell on her deck at home on February 19, 2021.  She scraped the shin on her left leg.  She said the first few days she did okay.  After several days, the prescription develops redness, warmth, and swelling.  There were scabs over the scrape.  Scabs were starting to lose blood tinged, clear fluid.  Entire left lower leg was very painful.  She was seen in the office initially on 02/25/2019.  The wound was cleaned and dressed with a clean dry dressing.  She was started on Bactrim DS twice daily and was given a prescription for Bactroban ointment to use twice daily for 7 days then as needed.  She states she has not been able to use the Bactroban.  States that it very much irritates the skin around the scrapes.  She is still taking the Bactrim twice daily.  Has not noticed any doses of that.  She states that swelling is getting some better.  States that most of the redness is due to tape holding on the bandage.  Tape irritates her skin also.  Surrounding skin still irritated still very tender to touch.  As swelling resolves, she has noticed a palpable lump at the top of the scrape. left lower extremity swelling and redness after fall a week ago. concern for clot vs. hematoma   Past Medical History:  Diagnosis Date   Hypertension     History reviewed. No pertinent surgical history.  Family History  Problem Relation Age of Onset   Hyperlipidemia Mother    Hypertension Mother    Cancer Father    Emphysema Father     Social History   Socioeconomic History   Marital status: Married    Spouse name: Not on file   Number of children: Not on file   Years of education: Not on file   Highest education level: Not on file  Occupational  History   Not on file  Tobacco Use   Smoking status: Never   Smokeless tobacco: Never  Vaping Use   Vaping Use: Never used  Substance and Sexual Activity   Alcohol use: No   Drug use: No   Sexual activity: Not on file  Other Topics Concern   Not on file  Social History Narrative   Not on file   Social Determinants of Health   Financial Resource Strain: Not on file  Food Insecurity: Not on file  Transportation Needs: Not on file  Physical Activity: Not on file  Stress: Not on file  Social Connections: Not on file  Intimate Partner Violence: Not on file    Outpatient Medications Prior to Visit  Medication Sig Dispense Refill   Biotin 5000 MCG CAPS Take 1 capsule by mouth daily.     hydrochlorothiazide (HYDRODIURIL) 12.5 MG tablet Take 1 tablet (12.5 mg total) by mouth daily. 90 tablet 1   losartan (COZAAR) 100 MG tablet TAKE 1 TABLET BY MOUTH EVERY DAY 90 tablet 0   ondansetron (ZOFRAN) 4 MG tablet Take 1 tablet (4 mg total) by mouth every 8 (eight) hours as needed for nausea or vomiting. 20 tablet 0   sulfamethoxazole-trimethoprim (BACTRIM DS) 800-160 MG tablet Take 1 tablet by mouth  2 (two) times daily. 20 tablet 0   mupirocin ointment (BACTROBAN) 2 % Apply small amount to effected area twice daily for next 7 days then as needed (Patient not taking: Reported on 03/02/2021) 22 g 1   No facility-administered medications prior to visit.    Allergies  Allergen Reactions   Penicillins    Plavix [Clopidogrel Bisulfate] Hives    ROS Review of Systems  Constitutional:  Positive for activity change and fatigue. Negative for chills and fever.  HENT:  Negative for congestion, postnasal drip, rhinorrhea, sinus pressure and sinus pain.   Eyes: Negative.   Respiratory:  Negative for cough, chest tightness, shortness of breath and wheezing.   Cardiovascular:  Negative for chest pain and palpitations.  Gastrointestinal:  Negative for constipation, diarrhea, nausea and vomiting.   Endocrine: Negative for cold intolerance, heat intolerance, polydipsia and polyuria.  Genitourinary: Negative.   Musculoskeletal:  Positive for arthralgias. Negative for back pain and myalgias.       Improved bruising along left shin.  Palpable area of tenderness above initial area of scrape now.  Was not noted at prior visit.  Skin:  Negative for rash.       Several circular, scabbed lesions in linear presentation along the left shin.  Redness and tenderness have improved some since she was last seen.  Scabs no longer draining.  Now has area of swelling just above initial scrape that was not noted at previous visit.  Allergic/Immunologic: Negative.   Neurological:  Negative for dizziness, weakness and headaches.  Psychiatric/Behavioral:  The patient is not nervous/anxious.   All other systems reviewed and are negative.    Objective:    Physical Exam Vitals and nursing note reviewed.  Constitutional:      Appearance: Normal appearance. She is well-developed.  HENT:     Head: Normocephalic and atraumatic.     Nose: Nose normal.     Mouth/Throat:     Mouth: Mucous membranes are moist.  Eyes:     Extraocular Movements: Extraocular movements intact.     Conjunctiva/sclera: Conjunctivae normal.     Pupils: Pupils are equal, round, and reactive to light.  Cardiovascular:     Rate and Rhythm: Normal rate and regular rhythm.     Pulses: Normal pulses.     Heart sounds: Normal heart sounds.  Pulmonary:     Effort: Pulmonary effort is normal.     Breath sounds: Normal breath sounds.  Abdominal:     Palpations: Abdomen is soft.  Musculoskeletal:        General: Tenderness present. Normal range of motion.     Cervical back: Normal range of motion and neck supple.     Comments: Tenderness with bruising present along the left shin.  There is no area of soft, palpable swelling above the initial scrape.  Tender and red.  Skin intact in this area.  Lymphadenopathy:     Cervical: No cervical  adenopathy.  Skin:    General: Skin is warm and dry.     Capillary Refill: Capillary refill takes less than 2 seconds.          Comments: Scabbed lesions present in linear formation and anterior shin.  Scabs intact.  No drainage present at this time.  Localized erythema is present.  Tenderness with palpation present.  Localized irritation from adhesive from tape/bandage.  Neurological:     General: No focal deficit present.     Mental Status: She is alert and oriented to person, place,  and time.  Psychiatric:        Mood and Affect: Mood normal.        Behavior: Behavior normal.        Thought Content: Thought content normal.        Judgment: Judgment normal.    Today's Vitals   03/02/21 1526  BP: 114/68  Pulse: 75  Temp: (!) 97.4 F (36.3 C)  SpO2: 99%  Weight: 202 lb 3.2 oz (91.7 kg)  Height: _0  (1.676 m)   Body mass index is 32.64 kg/m.   Wt Readings from Last 3 Encounters:  03/02/21 202 lb 3.2 oz (91.7 kg)  02/24/21 205 lb 1.6 oz (93 kg)  12/17/20 195 lb 6.4 oz (88.6 kg)     Health Maintenance Due  Topic Date Due   COVID-19 Vaccine (1) Never done   Hepatitis C Screening  Never done   Zoster Vaccines- Shingrix (1 of 2) Never done   DEXA SCAN  Never done   PNA vac Low Risk Adult (1 of 2 - PCV13) Never done   INFLUENZA VACCINE  03/14/2021    There are no preventive care reminders to display for this patient.  Lab Results  Component Value Date   TSH 1.270 12/14/2020   Lab Results  Component Value Date   WBC 6.3 12/14/2020   HGB 12.9 12/14/2020   HCT 38.6 12/14/2020   MCV 86 12/14/2020   PLT 305 12/14/2020   Lab Results  Component Value Date   NA 141 12/14/2020   K 4.0 12/14/2020   CO2 22 12/14/2020   GLUCOSE 107 (H) 12/14/2020   BUN 13 12/14/2020   CREATININE 0.64 12/14/2020   BILITOT 0.7 12/14/2020   ALKPHOS 77 12/14/2020   AST 14 12/14/2020   ALT 13 12/14/2020   PROT 6.2 12/14/2020   ALBUMIN 4.5 12/14/2020   CALCIUM 9.4 12/14/2020    EGFR 95 12/14/2020   Lab Results  Component Value Date   CHOL 185 12/14/2020   Lab Results  Component Value Date   HDL 53 12/14/2020   Lab Results  Component Value Date   LDLCALC 121 (H) 12/14/2020   Lab Results  Component Value Date   TRIG 55 12/14/2020   Lab Results  Component Value Date   CHOLHDL 3.5 12/14/2020   Lab Results  Component Value Date   HGBA1C 5.2 12/14/2020      Assessment & Plan:  1. Pain and swelling of left lower extremity New area of soft, tender, red swelling, just superior of initial injury.  This is likely hematoma, but would like to evaluate for possibility of superficial clot.  We will get ultrasound of the left lower extremity for further evaluation.  We will contact patient with results as soon as they are available.  She should continue to rest and elevate the left lower leg, keeping wound clean and dry. - US Venous Img Lower Unilateral Left; Future  2. Cellulitis and abscess of left lower extremity Change antibiotic treatment from Bactrim DS to Cipro 500 mg twice daily for next 10 days.  She should continue to keep wound clean and dry.  She should elevate her left lower leg when possible to help reduce swelling and pain.  She should contact the office if symptoms worsen or do not improve over the next 5 to 7 days.  She voiced understanding and agreement with the plan. - ciprofloxacin (CIPRO) 500 MG tablet; Take 1 tablet (500 mg total) by mouth 2 (two) times  daily.  Dispense: 20 tablet; Refill: 0   Problem List Items Addressed This Visit       Other   Cellulitis and abscess of left lower extremity   Relevant Medications   ciprofloxacin (CIPRO) 500 MG tablet   Pain and swelling of left lower extremity - Primary   Relevant Orders   US Venous Img Lower Unilateral Left (Completed)    Meds ordered this encounter  Medications   ciprofloxacin (CIPRO) 500 MG tablet    Sig: Take 1 tablet (500 mg total) by mouth 2 (two) times daily.    Dispense:   20 tablet    Refill:  0    Discontinue use of bactrim.    Order Specific Question:   Supervising Provider    Answer:   Beatrice Lecher D [[id number]]    Follow-up: Return for as scheduled - will contact with ultrasound results when available. .   This note was dictated using Systems analyst. Rapid proofreading was performed to expedite the delivery of the information. Despite proofreading, phonetic errors will occur which are common with this voice recognition software. Please take this into consideration. If there are any concerns, please contact our office.    Ronnell Freshwater, NP

## 2021-03-03 ENCOUNTER — Telehealth: Payer: Self-pay | Admitting: Physician Assistant

## 2021-03-03 NOTE — Telephone Encounter (Signed)
Patient called in inquiring about her MRI Referral that she was told was placed to [place] for her knee. She has not heard back. Please advise, thanks. Try patient's cell first, then if not, try husbands' [phone].

## 2021-03-03 NOTE — Telephone Encounter (Signed)
Contacted [facility] and got patient scheduled for first available [place] which is for Monday at 1015am at [place]. Patient advised if symptoms worsen, she starts having SOB or difficulty breathing to go to the ED. Patient verbalized understanding and was agreeable.  Patient states the swelling has improved. AS, CMA

## 2021-03-06 DIAGNOSIS — L02416 Cutaneous abscess of left lower limb: Secondary | ICD-10-CM | POA: Insufficient documentation

## 2021-03-06 DIAGNOSIS — T148XXA Other injury of unspecified body region, initial encounter: Secondary | ICD-10-CM | POA: Insufficient documentation

## 2021-03-07 ENCOUNTER — Other Ambulatory Visit: Payer: Self-pay

## 2021-03-07 ENCOUNTER — Ambulatory Visit
Admission: RE | Admit: 2021-03-07 | Discharge: 2021-03-07 | Disposition: A | Discharge status: Home / Self Care | Payer: Medicare HMO | Source: Ambulatory Visit | Attending: Nurse Practitioner | Admitting: Nurse Practitioner

## 2021-03-07 DIAGNOSIS — M79605 Pain in left leg: Secondary | ICD-10-CM

## 2021-03-07 DIAGNOSIS — M7989 Other specified soft tissue disorders: Secondary | ICD-10-CM | POA: Diagnosis not present

## 2021-03-07 DIAGNOSIS — M79662 Pain in left lower leg: Secondary | ICD-10-CM | POA: Diagnosis not present

## 2021-03-07 NOTE — Progress Notes (Signed)
Please let the patient know that her ultrasound shows no evidence of clot. Also no evidence of hematoma. Just area of swelling above the area of initial abrasion. Thanks.

## 2021-03-08 DIAGNOSIS — M5416 Radiculopathy, lumbar region: Secondary | ICD-10-CM | POA: Diagnosis not present

## 2021-03-18 ENCOUNTER — Ambulatory Visit (INDEPENDENT_AMBULATORY_CARE_PROVIDER_SITE_OTHER): Payer: Medicare HMO

## 2021-03-18 ENCOUNTER — Ambulatory Visit: Payer: Medicare HMO | Admitting: Surgical

## 2021-03-18 ENCOUNTER — Encounter: Payer: Self-pay | Admitting: Surgical

## 2021-03-18 ENCOUNTER — Other Ambulatory Visit: Payer: Self-pay

## 2021-03-18 DIAGNOSIS — M79605 Pain in left leg: Secondary | ICD-10-CM

## 2021-03-18 DIAGNOSIS — M7989 Other specified soft tissue disorders: Secondary | ICD-10-CM | POA: Insufficient documentation

## 2021-03-18 NOTE — Progress Notes (Signed)
Office Visit Note   Patient: Cindy Figueroa           Date of Birth: 1950/12/23           MRN: [place]:[id number] Visit Date: 03/18/2021 Requested by: Lorrene Reid, PA-C [address],  [place] [address] PCP: Lorrene Reid, PA-C  Subjective: Chief Complaint  Patient presents with   Left Leg - Pain    HPI: Cindy Figueroa is a 70 y.o. female who presents to the office complaining of left leg pain.  Patient notes that she fell on 02/19/2021 with impact to her left anterior shin.  She developed abrasion along the anterior shin that eventually developed redness as well as drainage.  She was placed on a couple courses of oral antibiotics and has now completed those.  She is also been applying Neosporin to the wound.  The wound has significantly improved in size and is no longer draining.  She denies any fevers, chills, night sweats.  She is able to weight-bear without difficulty.  She does note occasional throbbing pain and sensitivity to the touch but that is her main complaint.  She did have negative ultrasound for DVT on 03/07/2021..                ROS: All systems reviewed are negative as they relate to the chief complaint within the history of present illness.  Patient denies fevers or chills.  Assessment & Plan: Visit Diagnoses:  1. Pain in left leg     Plan: Patient is a 70 year old female who presents complaining of left shin wound since fall on 02/19/2021.  By her history the size of the wound is improving and becoming less symptomatic.  She has no difficulty weightbearing.  No sign of infection after multiple courses of antibiotics.  No concerning findings on exam and picture was placed in chart so size will be compared to when she returns.  Follow-up in 2 weeks for clinical recheck.  If any signs of infection pop up like fevers, chills, night sweats, drainage from wound, surrounding erythema, patient will reach out to the office.  Radiographs are negative for any  fracture.  Follow-Up Instructions: No follow-ups on file.   Orders:  Orders Placed This Encounter  Procedures   XR Tibia/Fibula Left   No orders of the defined types were placed in this encounter.     Procedures: No procedures performed   Clinical Data: No additional findings.  Objective: Vital Signs: There were no vitals taken for this visit.  Physical Exam:  Constitutional: Patient appears well-developed HEENT:  Head: Normocephalic Eyes:EOM are normal Neck: Normal range of motion Cardiovascular: Normal rate Pulmonary/chest: Effort normal Neurologic: Patient is alert Skin: Skin is warm Psychiatric: Patient has normal mood and affect  Ortho Exam: Ortho exam demonstrates left shin with small ulceration along the anterior aspect.  No expressible drainage.  No area of fluctuance.  No significant surrounding erythema concerning for cellulitis.  No calf tenderness.  Mild tenderness through the anterior shin.  1 scab was removed with no underlying wound or any evidence for infection.  Excellent DP pulses palpable.     Specialty Comments:  No specialty comments available.  Imaging: No results found.   PMFS History: Patient Active Problem List   Diagnosis Date Noted   Pain and swelling of left lower extremity 03/18/2021   Cellulitis and abscess of left lower extremity 03/06/2021   Traumatic abrasion 03/06/2021   History of COVID-19 10/06/2020  Upper respiratory tract infection due to COVID-19 virus 10/04/2020   Wheezing 10/04/2020   Nausea 10/04/2020   Open wound of left lower leg 01/09/2019   Pain in right upper arm 12/31/2018   PTSD (post-traumatic stress disorder) 10/21/2018   Anxiety in acute stress reaction 10/21/2018   Healthcare maintenance 06/06/2018   Screening for breast cancer 06/06/2018   Encounter for Medicare annual wellness exam 12/06/2017   Obesity with body mass index of 30.0-39.9 03/13/2017   Hyperlipidemia 03/13/2017   Vitamin D deficiency  01/11/2017   HTN (hypertension) 12/26/2016   Poor balance 12/26/2016   H/O fatigue 12/26/2016   Family history of high cholesterol 12/26/2016   Past Medical History:  Diagnosis Date   Hypertension     Family History  Problem Relation Age of Onset   Hyperlipidemia Mother    Hypertension Mother    Cancer Father    Emphysema Father     History reviewed. No pertinent surgical history. Social History   Occupational History   Not on file  Tobacco Use   Smoking status: Never   Smokeless tobacco: Never  Vaping Use   Vaping Use: Never used  Substance and Sexual Activity   Alcohol use: No   Drug use: No   Sexual activity: Not on file

## 2021-04-05 DIAGNOSIS — M5416 Radiculopathy, lumbar region: Secondary | ICD-10-CM | POA: Diagnosis not present

## 2021-04-05 DIAGNOSIS — Z6831 Body mass index (BMI) 31.0-31.9, adult: Secondary | ICD-10-CM | POA: Diagnosis not present

## 2021-04-05 DIAGNOSIS — I10 Essential (primary) hypertension: Secondary | ICD-10-CM | POA: Diagnosis not present

## 2021-05-02 ENCOUNTER — Ambulatory Visit (INDEPENDENT_AMBULATORY_CARE_PROVIDER_SITE_OTHER): Payer: Medicare HMO

## 2021-05-02 ENCOUNTER — Ambulatory Visit: Payer: Medicare HMO | Admitting: Orthopedic Surgery

## 2021-05-02 ENCOUNTER — Other Ambulatory Visit: Payer: Self-pay

## 2021-05-02 DIAGNOSIS — M19079 Primary osteoarthritis, unspecified ankle and foot: Secondary | ICD-10-CM

## 2021-05-02 DIAGNOSIS — M79671 Pain in right foot: Secondary | ICD-10-CM

## 2021-05-04 ENCOUNTER — Encounter: Payer: Self-pay | Admitting: Orthopedic Surgery

## 2021-05-04 NOTE — Progress Notes (Signed)
Office Visit Note   Patient: Cindy Figueroa           Date of Birth: November 09, 1950           MRN: [id number] Visit Date: 05/02/2021 Requested by: Lorrene Reid, PA-C [address],  [place] [address] PCP: Lorrene Reid, PA-C  Subjective: Chief Complaint  Patient presents with   Left Leg - Follow-up   Right Foot - Pain    HPI: Cindy Figueroa is a 70 year old patient here to follow-up from her left leg laceration.  She is doing well with that.  Date of injury on that was 02/19/2021.  She also reports right foot pain.  The foot pain is worsening.  States that she was told she had arthritis pressing on her nerve.  She feels like something "will need to be done" in the near future.  She is getting injections for her back.              ROS: All systems reviewed are negative as they relate to the chief complaint within the history of present illness.  Patient denies  fevers or chills.   Assessment & Plan: Visit Diagnoses:  1. Pain in right foot   2. Arthritis of midfoot     Plan: Impression is well-healed laceration left leg.  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  She is wearing reasonable shoes.  I think her only option would be to get slightly better arch supports or to consider medial column fusion.  I think she is going to live with what she has for now.  Follow-up as needed  Follow-Up Instructions: Return if symptoms worsen or fail to improve.   Orders:  Orders Placed This Encounter  Procedures   XR Foot Complete Right   No orders of the defined types were placed in this encounter.     Procedures: No procedures performed   Clinical Data: No additional findings.  Objective: Vital Signs: There were no vitals taken for this visit.  Physical Exam:   Constitutional: Patient appears well-developed HEENT:  Head: Normocephalic Eyes:EOM are normal Neck: Normal range of  motion Cardiovascular: Normal rate Pulmonary/chest: Effort normal Neurologic: Patient is alert Skin: Skin is warm Psychiatric: Patient has normal mood and affect   Ortho Exam: Ortho exam demonstrates full active and passive range of motion of the ankle on the right.  She has some pain with pronation supination of the forefoot.  Palpable intact nontender anterior to posterior to peroneal and Achilles tendons.  Pedal pulses are palpable.  Left leg laceration healing nicely.  Arches normal in the right foot.  Specialty Comments:  No specialty comments available.  Imaging: No results found.   PMFS History: Patient Active Problem List   Diagnosis Date Noted   Pain and swelling of left lower extremity 03/18/2021   Cellulitis and abscess of left lower extremity 03/06/2021   Traumatic abrasion 03/06/2021   History of COVID-19 10/06/2020   Upper respiratory tract infection due to COVID-19 virus 10/04/2020   Wheezing 10/04/2020   Nausea 10/04/2020   Open wound of left lower leg 01/09/2019   Pain in right upper arm 12/31/2018   PTSD (post-traumatic stress disorder) 10/21/2018   Anxiety in acute stress reaction 10/21/2018   Healthcare maintenance 06/06/2018   Screening for breast cancer 06/06/2018   Encounter for Medicare annual wellness exam 12/06/2017   Obesity with body mass index of 30.0-39.9  03/13/2017   Hyperlipidemia 03/13/2017   Vitamin D deficiency 01/11/2017   HTN (hypertension) 12/26/2016   Poor balance 12/26/2016   H/O fatigue 12/26/2016   Family history of high cholesterol 12/26/2016   Past Medical History:  Diagnosis Date   Hypertension     Family History  Problem Relation Age of Onset   Hyperlipidemia Mother    Hypertension Mother    Cancer Father    Emphysema Father     No past surgical history on file. Social History   Occupational History   Not on file  Tobacco Use   Smoking status: Never   Smokeless tobacco: Never  Vaping Use   Vaping Use: Never  used  Substance and Sexual Activity   Alcohol use: No   Drug use: No   Sexual activity: Not on file

## 2021-05-16 ENCOUNTER — Other Ambulatory Visit: Payer: Self-pay | Admitting: Physician Assistant

## 2021-05-23 DIAGNOSIS — M4726 Other spondylosis with radiculopathy, lumbar region: Secondary | ICD-10-CM | POA: Diagnosis not present

## 2021-05-23 DIAGNOSIS — M4727 Other spondylosis with radiculopathy, lumbosacral region: Secondary | ICD-10-CM | POA: Diagnosis not present

## 2021-05-23 DIAGNOSIS — M4805 Spinal stenosis, thoracolumbar region: Secondary | ICD-10-CM | POA: Diagnosis not present

## 2021-05-23 DIAGNOSIS — M48061 Spinal stenosis, lumbar region without neurogenic claudication: Secondary | ICD-10-CM | POA: Diagnosis not present

## 2021-05-23 DIAGNOSIS — M4807 Spinal stenosis, lumbosacral region: Secondary | ICD-10-CM | POA: Diagnosis not present

## 2021-05-30 DIAGNOSIS — M5416 Radiculopathy, lumbar region: Secondary | ICD-10-CM | POA: Diagnosis not present

## 2021-05-30 DIAGNOSIS — Z6831 Body mass index (BMI) 31.0-31.9, adult: Secondary | ICD-10-CM | POA: Diagnosis not present

## 2021-05-30 DIAGNOSIS — I10 Essential (primary) hypertension: Secondary | ICD-10-CM | POA: Diagnosis not present

## 2021-06-08 DIAGNOSIS — M5416 Radiculopathy, lumbar region: Secondary | ICD-10-CM | POA: Diagnosis not present

## 2021-06-15 ENCOUNTER — Other Ambulatory Visit: Payer: Self-pay

## 2021-06-15 DIAGNOSIS — E785 Hyperlipidemia, unspecified: Secondary | ICD-10-CM

## 2021-06-15 DIAGNOSIS — Z130 Encounter for screening for diseases of the blood and blood-forming organs and certain disorders involving the immune mechanism: Secondary | ICD-10-CM

## 2021-06-15 DIAGNOSIS — Z1321 Encounter for screening for nutritional disorder: Secondary | ICD-10-CM

## 2021-06-15 DIAGNOSIS — Z0000 Encounter for general adult medical examination without abnormal findings: Secondary | ICD-10-CM

## 2021-06-15 DIAGNOSIS — I10 Essential (primary) hypertension: Secondary | ICD-10-CM

## 2021-06-17 ENCOUNTER — Other Ambulatory Visit: Payer: Medicare HMO

## 2021-06-17 ENCOUNTER — Other Ambulatory Visit: Payer: Self-pay

## 2021-06-17 DIAGNOSIS — I10 Essential (primary) hypertension: Secondary | ICD-10-CM | POA: Diagnosis not present

## 2021-06-17 DIAGNOSIS — Z13228 Encounter for screening for other metabolic disorders: Secondary | ICD-10-CM | POA: Diagnosis not present

## 2021-06-17 DIAGNOSIS — Z130 Encounter for screening for diseases of the blood and blood-forming organs and certain disorders involving the immune mechanism: Secondary | ICD-10-CM | POA: Diagnosis not present

## 2021-06-17 DIAGNOSIS — Z0000 Encounter for general adult medical examination without abnormal findings: Secondary | ICD-10-CM | POA: Diagnosis not present

## 2021-06-17 DIAGNOSIS — E785 Hyperlipidemia, unspecified: Secondary | ICD-10-CM

## 2021-06-17 DIAGNOSIS — Z1329 Encounter for screening for other suspected endocrine disorder: Secondary | ICD-10-CM | POA: Diagnosis not present

## 2021-06-17 DIAGNOSIS — Z1321 Encounter for screening for nutritional disorder: Secondary | ICD-10-CM | POA: Diagnosis not present

## 2021-06-18 LAB — COMPREHENSIVE METABOLIC PANEL
ALT: 15 IU/L (ref 0–32)
AST: 16 IU/L (ref 0–40)
Albumin/Globulin Ratio: 2.6 — ABNORMAL HIGH (ref 1.2–2.2)
Albumin: 4.2 g/dL (ref 3.7–4.7)
Alkaline Phosphatase: 70 IU/L (ref 44–121)
BUN/Creatinine Ratio: 15 (ref 12–28)
BUN: 10 mg/dL (ref 8–27)
Bilirubin Total: 0.8 mg/dL (ref 0.0–1.2)
CO2: 25 mmol/L (ref 20–29)
Calcium: 9.4 mg/dL (ref 8.7–10.3)
Chloride: 104 mmol/L (ref 96–106)
Creatinine, Ser: 0.67 mg/dL (ref 0.57–1.00)
Globulin, Total: 1.6 g/dL (ref 1.5–4.5)
Glucose: 92 mg/dL (ref 70–99)
Potassium: 4.3 mmol/L (ref 3.5–5.2)
Sodium: 140 mmol/L (ref 134–144)
Total Protein: 5.8 g/dL — ABNORMAL LOW (ref 6.0–8.5)
eGFR: 93 mL/min/{1.73_m2} (ref >59)

## 2021-06-18 LAB — CBC WITH DIFFERENTIAL/PLATELET
Basophils Absolute: 0 10*3/uL (ref 0.0–0.2)
Basos: 1 %
EOS (ABSOLUTE): 0.2 10*3/uL (ref 0.0–0.4)
Eos: 3 %
Hematocrit: 41.3 % (ref 34.0–46.6)
Hemoglobin: 13.5 g/dL (ref 11.1–15.9)
Immature Grans (Abs): 0 10*3/uL (ref 0.0–0.1)
Immature Granulocytes: 0 %
Lymphocytes Absolute: 2 10*3/uL (ref 0.7–3.1)
Lymphs: 30 %
MCH: 29 pg (ref 26.6–33.0)
MCHC: 32.7 g/dL (ref 31.5–35.7)
MCV: 89 fL (ref 79–97)
Monocytes Absolute: 0.5 10*3/uL (ref 0.1–0.9)
Monocytes: 8 %
Neutrophils Absolute: 3.8 10*3/uL (ref 1.4–7.0)
Neutrophils: 58 %
Platelets: 282 10*3/uL (ref 150–450)
RBC: 4.65 x10E6/uL (ref 3.77–5.28)
RDW: 13.1 % (ref 11.7–15.4)
WBC: 6.4 10*3/uL (ref 3.4–10.8)

## 2021-06-18 LAB — LIPID PANEL
Chol/HDL Ratio: 3.9 ratio (ref 0.0–4.4)
Cholesterol, Total: 194 mg/dL (ref 100–199)
HDL: 50 mg/dL (ref >39)
LDL Chol Calc (NIH): 127 mg/dL — ABNORMAL HIGH (ref 0–99)
Triglycerides: 96 mg/dL (ref 0–149)
VLDL Cholesterol Cal: 17 mg/dL (ref 5–40)

## 2021-06-18 LAB — TSH: TSH: 1.21 u[IU]/mL (ref 0.450–4.500)

## 2021-06-18 LAB — HEMOGLOBIN A1C
Est. average glucose Bld gHb Est-mCnc: 105 mg/dL
Hgb A1c MFr Bld: 5.3 % (ref 4.8–5.6)

## 2021-06-20 ENCOUNTER — Other Ambulatory Visit: Payer: Self-pay

## 2021-06-20 ENCOUNTER — Ambulatory Visit (INDEPENDENT_AMBULATORY_CARE_PROVIDER_SITE_OTHER): Payer: Medicare HMO | Admitting: Physician Assistant

## 2021-06-20 ENCOUNTER — Encounter: Payer: Self-pay | Admitting: Physician Assistant

## 2021-06-20 VITALS — BP 129/82 | HR 67 | Temp 97.7°F | Ht 66.0 in | Wt 201.0 lb

## 2021-06-20 DIAGNOSIS — E785 Hyperlipidemia, unspecified: Secondary | ICD-10-CM

## 2021-06-20 DIAGNOSIS — I10 Essential (primary) hypertension: Secondary | ICD-10-CM

## 2021-06-20 DIAGNOSIS — Z0000 Encounter for general adult medical examination without abnormal findings: Secondary | ICD-10-CM | POA: Diagnosis not present

## 2021-06-20 NOTE — Progress Notes (Signed)
Subjective:   Cindy Figueroa is a 70 y.o. female who presents for Medicare Annual (Subsequent) preventive examination.  Review of Systems    General:   No F/C, wt loss Pulm:   No DIB, SOB, pleuritic chest pain Card:  No CP, palpitations Abd:  No n/v/d or pain Ext:  No inc edema from baseline    Objective:    Today's Vitals   06/20/21 1356  BP: 129/82  Pulse: 67  Temp: 97.7 F (36.5 C)  SpO2: 97%  Weight: 201 lb (91.2 kg)  Height: 5\' 6"  (1.676 m)   Body mass index is 32.44 kg/m.  Advanced Directives 12/26/2016  Does Patient Have a Medical Advance Directive? No    Current Medications (verified) Outpatient Encounter Medications as of 06/20/2021  Medication Sig   Biotin 5000 MCG CAPS Take 1 capsule by mouth daily.   losartan (COZAAR) 100 MG tablet TAKE 1 TABLET BY MOUTH EVERY DAY   ondansetron (ZOFRAN) 4 MG tablet Take 1 tablet (4 mg total) by mouth every 8 (eight) hours as needed for nausea or vomiting.   [DISCONTINUED] ciprofloxacin (CIPRO) 500 MG tablet Take 1 tablet (500 mg total) by mouth 2 (two) times daily.   [DISCONTINUED] hydrochlorothiazide (HYDRODIURIL) 12.5 MG tablet Take 1 tablet (12.5 mg total) by mouth daily.   [DISCONTINUED] mupirocin ointment (BACTROBAN) 2 % Apply small amount to effected area twice daily for next 7 days then as needed (Patient not taking: Reported on 03/02/2021)   [DISCONTINUED] sulfamethoxazole-trimethoprim (BACTRIM DS) 800-160 MG tablet Take 1 tablet by mouth 2 (two) times daily.   No facility-administered encounter medications on file as of 06/20/2021.    Allergies (verified) Penicillins and Plavix [clopidogrel bisulfate]   History: Past Medical History:  Diagnosis Date   Hypertension    History reviewed. No pertinent surgical history. Family History  Problem Relation Age of Onset   Hyperlipidemia Mother    Hypertension Mother    Cancer Father    Emphysema Father    Social History   Socioeconomic History   Marital  status: Married    Spouse name: Not on file   Number of children: Not on file   Years of education: Not on file   Highest education level: Not on file  Occupational History   Not on file  Tobacco Use   Smoking status: Never   Smokeless tobacco: Never  Vaping Use   Vaping Use: Never used  Substance and Sexual Activity   Alcohol use: No   Drug use: No   Sexual activity: Not on file  Other Topics Concern   Not on file  Social History Narrative   Not on file   Social Determinants of Health   Financial Resource Strain: Not on file  Food Insecurity: Not on file  Transportation Needs: Not on file  Physical Activity: Not on file  Stress: Not on file  Social Connections: Not on file    Tobacco Counseling Counseling given: Not Answered    Diabetic? no   Activities of Daily Living  In your present state of health, do you have any difficulty performing the following activities: 06/20/2021 02/24/2021  Hearing? N N  Vision? N N  Difficulty concentrating or making decisions? N N  Walking or climbing stairs? N Y  Dressing or bathing? N N  Doing errands, shopping? N N  Some recent data might be hidden    Patient Care Team: Lorrene Reid, PA-C as PCP - General (Physician Assistant) Allyn Kenner, MD (Dermatology)  Starling Manns, MD (Orthopedic Surgery)  Indicate any recent Medical Services you may have received from other than [facility] providers in the past year (date may be approximate).     Assessment:   This is a routine wellness examination for [name].  Hearing/Vision screen No results found.  Dietary issues and exercise activities discussed: -Patient follows vegetarian diet with low carbohydrate and fat intake. Stays active with house work.   Goals Addressed   None   Depression Screen PHQ 2/9 Scores 06/20/2021 02/24/2021 12/17/2020 10/04/2020 08/19/2020 07/20/2020 12/29/2019  PHQ - 2 Score 1 0 1 2 0 3 1  PHQ- 9 Score 4 3 1 4 2 4 2     Fall Risk Fall Risk  06/20/2021  02/24/2021 12/17/2020 10/04/2020 08/19/2020  Falls in the past year? 1 1 1 1 1   Number falls in past yr: 0 0 0 1 1  Injury with Fall? 1 1 1 1 1   Risk for fall due to : History of fall(s);Impaired balance/gait - History of fall(s) History of fall(s) -  Follow up Falls evaluation completed Falls evaluation completed Falls evaluation completed Falls evaluation completed Falls evaluation completed    FALL RISK PREVENTION PERTAINING TO THE HOME:  Any stairs in or around the home? Yes  If so, are there any without handrails? No  Home free of loose throw rugs in walkways, pet beds, electrical cords, etc? Yes  Adequate lighting in your home to reduce risk of falls? Yes   ASSISTIVE DEVICES UTILIZED TO PREVENT FALLS:  Life alert? No  Use of a cane, walker or w/c? No  Grab bars in the bathroom? No  Shower chair or bench in shower? No  Elevated toilet seat or a handicapped toilet? No   TIMED UP AND GO:  Was the test performed? Yes .  Length of time to ambulate 10 feet: 12 sec.   Gait slow and steady without use of assistive device  Cognitive Function: wnl's    6CIT Screen 06/20/2021  What Year? 0 points  What month? 0 points  What time? 0 points  Count back from 20 0 points  Months in reverse 0 points  Repeat phrase 0 points  Total Score 0    Immunizations Immunization History  Administered Date(s) Administered   Tdap 12/26/2016    TDAP status: Up to date  Flu Vaccine status: Declined, Education has been provided regarding the importance of this vaccine but patient still declined. Advised may receive this vaccine at local pharmacy or Health Dept. Aware to provide a copy of the vaccination record if obtained from local pharmacy or Health Dept. Verbalized acceptance and understanding.  Pneumococcal vaccine status: Due, Education has been provided regarding the importance of this vaccine. Advised may receive this vaccine at local pharmacy or Health Dept. Aware to provide a copy of the  vaccination record if obtained from local pharmacy or Health Dept. Verbalized acceptance and understanding.  Covid-19 vaccine status: Declined, Education has been provided regarding the importance of this vaccine but patient still declined. Advised may receive this vaccine at local pharmacy or Health Dept.or vaccine clinic. Aware to provide a copy of the vaccination record if obtained from local pharmacy or Health Dept. Verbalized acceptance and understanding.  Qualifies for Shingles Vaccine? Yes   Zostavax completed No   Shingrix Completed?: No.    Education has been provided regarding the importance of this vaccine. Patient has been advised to call insurance company to determine out of pocket expense if they have not  yet received this vaccine. Advised may also receive vaccine at local pharmacy or Health Dept. Verbalized acceptance and understanding.  Screening Tests Health Maintenance  Topic Date Due   COVID-19 Vaccine (1) Never done   Hepatitis C Screening  Never done   Zoster Vaccines- Shingrix (1 of 2) Never done   Pneumonia Vaccine 23+ Years old (1 - PCV) Never done   DEXA SCAN  Never done   INFLUENZA VACCINE  Never done   MAMMOGRAM  11/16/2022   TETANUS/TDAP  12/27/2026   COLONOSCOPY (Pts 45-52yrs Insurance coverage will need to be confirmed)  12/29/2027   HPV VACCINES  Aged Out    Health Maintenance  Health Maintenance Due  Topic Date Due   COVID-19 Vaccine (1) Never done   Hepatitis C Screening  Never done   Zoster Vaccines- Shingrix (1 of 2) Never done   Pneumonia Vaccine 59+ Years old (1 - PCV) Never done   DEXA SCAN  Never done   INFLUENZA VACCINE  Never done    Colorectal cancer screening: Type of screening: Cologuard. Completed 12/18/2017. Repeat every 3 years Pt declined repeating Cologuard.  Mammogram status: Completed 11/15/2020. Normal. Repeat every year.  Bone Density status: Ordered 11/17/2020. Pt provided with contact info and advised to call to schedule  appt.  Lung Cancer Screening: (Low Dose CT Chest recommended if Age 67-80 years, 30 pack-year currently smoking OR have quit w/in 15years.) does not qualify.   Lung Cancer Screening Referral: n/a  Additional Screening:  Hepatitis C Screening: does qualify; Patient declined.   Vision Screening: Recommended annual ophthalmology exams for early detection of glaucoma and other disorders of the eye. Is the patient up to date with their annual eye exam?  No  Who is the provider or what is the name of the office in which the patient attends annual eye exams? N/a If pt is not established with a provider, would they like to be referred to a provider to establish care? No .   Dental Screening: Recommended annual dental exams for proper oral hygiene  Community Resource Referral / Chronic Care Management: CRR required this visit?  No   CCM required this visit?  No    Plan:  -Discussed most recent labs which are essentially within normal limits or stable from with the exception of lipid panel- LDL elevated. The 10-year ASCVD risk score (Arnett DK, et al., 2019) is: 14.3%. Patient prefers to continue with dietary and lifestyle modifications, not interested on statin therapy. -BP stable. -Continue current medication regimen.  -Follow up in 6 months for HTN, HLD and FBW few days prior (cmp, lipid)   I have personally reviewed and noted the following in the patient's chart:   Medical and social history Use of alcohol, tobacco or illicit drugs  Current medications and supplements including opioid prescriptions.  Functional ability and status Nutritional status Physical activity Advanced directives List of other physicians Hospitalizations, surgeries, and ER visits in previous 12 months Vitals Screenings to include cognitive, depression, and falls Referrals and appointments  In addition, I have reviewed and discussed with patient certain preventive protocols, quality metrics, and best  practice recommendations. A written personalized care plan for preventive services as well as general preventive health recommendations were provided to patient.     Lorrene Reid, PA-C   06/21/2021

## 2021-06-20 NOTE — Patient Instructions (Signed)
Preventive Care 40 Years and Older, Female Preventive care refers to lifestyle choices and visits with your health care provider that can promote health and wellness. Preventive care visits are also called wellness exams. What can I expect for my preventive care visit? Counseling Your health care provider may ask you questions about your: Medical history, including: Past medical problems. Family medical history. Pregnancy and menstrual history. History of falls. Current health, including: Memory and ability to understand (cognition). Emotional well-being. Home life and relationship well-being. Sexual activity and sexual health. Lifestyle, including: Alcohol, nicotine or tobacco, and drug use. Access to firearms. Diet, exercise, and sleep habits. Work and work Statistician. Sunscreen use. Safety issues such as seatbelt and bike helmet use. Physical exam Your health care provider will check your: Height and weight. These may be used to calculate your BMI (body mass index). BMI is a measurement that tells if you are at a healthy weight. Waist circumference. This measures the distance around your waistline. This measurement also tells if you are at a healthy weight and may help predict your risk of certain diseases, such as type 2 diabetes and high blood pressure. Heart rate and blood pressure. Body temperature. Skin for abnormal spots. What immunizations do I need? Vaccines are usually given at various ages, according to a schedule. Your health care provider will recommend vaccines for you based on your age, medical history, and lifestyle or other factors, such as travel or where you work. What tests do I need? Screening Your health care provider may recommend screening tests for certain conditions. This may include: Lipid and cholesterol levels. Hepatitis C test. Hepatitis B test. HIV (human immunodeficiency virus) test. STI (sexually transmitted infection) testing, if you are at  risk. Lung cancer screening. Colorectal cancer screening. Diabetes screening. This is done by checking your blood sugar (glucose) after you have not eaten for a while (fasting). Mammogram. Talk with your health care provider about how often you should have regular mammograms. BRCA-related cancer screening. This may be done if you have a family history of breast, ovarian, tubal, or peritoneal cancers. Bone density scan. This is done to screen for osteoporosis. Talk with your health care provider about your test results, treatment options, and if necessary, the need for more tests. Follow these instructions at home: Eating and drinking  Eat a diet that includes fresh fruits and vegetables, whole grains, lean protein, and low-fat dairy products. Limit your intake of foods with high amounts of sugar, saturated fats, and salt. Take vitamin and mineral supplements as recommended by your health care provider. Do not drink alcohol if your health care provider tells you not to drink. If you drink alcohol: Limit how much you have to 0-1 drink a day. Know how much alcohol is in your drink. In the [place] one drink equals one 12 oz bottle of beer (355 mL), one 5 oz glass of wine (148 mL), or one 1 oz glass of hard liquor (44 mL). Lifestyle Brush your teeth every morning and night with fluoride toothpaste. Floss one time each day. Exercise for at least 30 minutes 5 or more days each week. Do not use any products that contain nicotine or tobacco. These products include cigarettes, chewing tobacco, and vaping devices, such as e-cigarettes. If you need help quitting, ask your health care provider. Do not use drugs. If you are sexually active, practice safe sex. Use a condom or other form of protection in order to prevent STIs. Take aspirin only as told by your  health care provider. Make sure that you understand how much to take and what form to take. Work with your health care provider to find out whether it  is safe and beneficial for you to take aspirin daily. Ask your health care provider if you need to take a cholesterol-lowering medicine (statin). Find healthy ways to manage stress, such as: Meditation, yoga, or listening to music. Journaling. Talking to a trusted person. Spending time with friends and family. Minimize exposure to UV radiation to reduce your risk of skin cancer. Safety Always wear your seat belt while driving or riding in a vehicle. Do not drive: If you have been drinking alcohol. Do not ride with someone who has been drinking. When you are tired or distracted. While texting. If you have been using any mind-altering substances or drugs. Wear a helmet and other protective equipment during sports activities. If you have firearms in your house, make sure you follow all gun safety procedures. What's next? Visit your health care provider once a year for an annual wellness visit. Ask your health care provider how often you should have your eyes and teeth checked. Stay up to date on all vaccines. This information is not intended to replace advice given to you by your health care provider. Make sure you discuss any questions you have with your health care provider. Document Revised: 01/26/2021 Document Reviewed: 01/26/2021 Elsevier Patient Education  [place].

## 2021-06-30 DIAGNOSIS — M5416 Radiculopathy, lumbar region: Secondary | ICD-10-CM | POA: Diagnosis not present

## 2021-07-18 ENCOUNTER — Telehealth: Payer: Self-pay | Admitting: Physician Assistant

## 2021-07-18 DIAGNOSIS — M545 Low back pain, unspecified: Secondary | ICD-10-CM

## 2021-07-18 NOTE — Telephone Encounter (Signed)
Patient states she has a pinched nerve in her back and is requesting something to help with the constant pain that she is in. [facility] gets injections to help with the pinched nerve but that isnt working as well as it did before.      [facility] on [address]

## 2021-07-19 MED ORDER — BACLOFEN 10 MG PO TABS: 10.0000 mg | ORAL_TABLET | Freq: Three times a day (TID) | ORAL | 0 refills | Status: DC | PRN

## 2021-07-19 NOTE — Telephone Encounter (Signed)
Patient is aware of the below and verbalized understanding. AS, CMA 

## 2021-07-19 NOTE — Addendum Note (Signed)
Addended by: Lorrene Reid on: 07/19/2021 07:52 AM   Modules accepted: Orders

## 2021-08-09 ENCOUNTER — Other Ambulatory Visit: Payer: Self-pay | Admitting: Physician Assistant

## 2021-08-10 ENCOUNTER — Encounter: Payer: Self-pay | Admitting: Physician Assistant

## 2021-08-10 ENCOUNTER — Ambulatory Visit (INDEPENDENT_AMBULATORY_CARE_PROVIDER_SITE_OTHER): Payer: Medicare HMO | Admitting: Physician Assistant

## 2021-08-10 ENCOUNTER — Other Ambulatory Visit: Payer: Self-pay

## 2021-08-10 VITALS — BP 130/74 | HR 72 | Temp 98.0°F | Ht 66.0 in | Wt 204.0 lb

## 2021-08-10 DIAGNOSIS — J0140 Acute pansinusitis, unspecified: Secondary | ICD-10-CM | POA: Diagnosis not present

## 2021-08-10 DIAGNOSIS — B351 Tinea unguium: Secondary | ICD-10-CM | POA: Diagnosis not present

## 2021-08-10 MED ORDER — ONDANSETRON HCL 4 MG PO TABS: 4.0000 mg | ORAL_TABLET | Freq: Three times a day (TID) | ORAL | 0 refills | Status: DC | PRN

## 2021-08-10 MED ORDER — CICLOPIROX 8 % EX SOLN: Freq: Every day | CUTANEOUS | 1 refills | Status: DC

## 2021-08-10 NOTE — Progress Notes (Signed)
Acute Office Visit  Subjective:    Patient ID: Cindy Figueroa, female    DOB: October 30, 1950, 70 y.o.   MRN: [id number]  Chief Complaint  Patient presents with   Acute Visit    HPI Patient is in today for c/o left great toenail discoloration x 1.5 year. Patient reports has been treating nail with OTC anti-fungal cream which has been effective. No drainage or pain. States has been careful with cutting her toenails.   Past Medical History:  Diagnosis Date   Hypertension     History reviewed. No pertinent surgical history.  Family History  Problem Relation Age of Onset   Hyperlipidemia Mother    Hypertension Mother    Cancer Father    Emphysema Father     Social History   Socioeconomic History   Marital status: Married    Spouse name: Not on file   Number of children: Not on file   Years of education: Not on file   Highest education level: Not on file  Occupational History   Not on file  Tobacco Use   Smoking status: Never   Smokeless tobacco: Never  Vaping Use   Vaping Use: Never used  Substance and Sexual Activity   Alcohol use: No   Drug use: No   Sexual activity: Not on file  Other Topics Concern   Not on file  Social History Narrative   Not on file   Social Determinants of Health   Financial Resource Strain: Not on file  Food Insecurity: Not on file  Transportation Needs: Not on file  Physical Activity: Not on file  Stress: Not on file  Social Connections: Not on file  Intimate Partner Violence: Not on file    Outpatient Medications Prior to Visit  Medication Sig Dispense Refill   Biotin 5000 MCG CAPS Take 1 capsule by mouth daily.     losartan (COZAAR) 100 MG tablet TAKE 1 TABLET BY MOUTH EVERY DAY 90 tablet 0   ondansetron (ZOFRAN) 4 MG tablet Take 1 tablet (4 mg total) by mouth every 8 (eight) hours as needed for nausea or vomiting. 20 tablet 0   baclofen (LIORESAL) 10 MG tablet Take 1 tablet (10 mg total) by mouth 3 (three) times daily as  needed for muscle spasms. 30 each 0   No facility-administered medications prior to visit.    Allergies  Allergen Reactions   Penicillins    Plavix [Clopidogrel Bisulfate] Hives    Review of Systems A fourteen system review of systems was performed and found to be positive as per HPI.    Objective:    Physical Exam General:  Well Developed, well nourished, appropriate for stated age.  Neuro:  Alert and oriented,  extra-ocular muscles intact  HEENT:  Normocephalic, atraumatic, neck supple Skin:  no gross rash, warm, pink. Respiratory: Not using accessory muscles, speaking in full sentences- unlabored. Extremities/Vascular:  Ext warm, no cyanosis apprec.; cap RF less 2 sec. Thick and yellow discoloration of great left toe noted Psych:  No HI/SI, judgement and insight good, Euthymic mood. Full Affect.  BP 130/74    Pulse 72    Temp 98 F (36.7 C)    Ht 5' 6"  (1.676 m)    Wt 204 lb (92.5 kg)    SpO2 98%    BMI 32.93 kg/m  Wt Readings from Last 3 Encounters:  08/10/21 204 lb (92.5 kg)  06/20/21 201 lb (91.2 kg)  03/02/21 202 lb 3.2 oz (91.7 kg)  Health Maintenance Due  Topic Date Due   COVID-19 Vaccine (1) Never done   Hepatitis C Screening  Never done   Zoster Vaccines- Shingrix (1 of 2) Never done   Pneumonia Vaccine 69+ Years old (1 - PCV) Never done   DEXA SCAN  Never done   INFLUENZA VACCINE  Never done    There are no preventive care reminders to display for this patient.   Lab Results  Component Value Date   TSH 1.210 06/17/2021   Lab Results  Component Value Date   WBC 6.4 06/17/2021   HGB 13.5 06/17/2021   HCT 41.3 06/17/2021   MCV 89 06/17/2021   PLT 282 06/17/2021   Lab Results  Component Value Date   NA 140 06/17/2021   K 4.3 06/17/2021   CO2 25 06/17/2021   GLUCOSE 92 06/17/2021   BUN 10 06/17/2021   CREATININE 0.67 06/17/2021   BILITOT 0.8 06/17/2021   ALKPHOS 70 06/17/2021   AST 16 06/17/2021   ALT 15 06/17/2021   PROT 5.8 (L)  06/17/2021   ALBUMIN 4.2 06/17/2021   CALCIUM 9.4 06/17/2021   EGFR 93 06/17/2021   Lab Results  Component Value Date   CHOL 194 06/17/2021   Lab Results  Component Value Date   HDL 50 06/17/2021   Lab Results  Component Value Date   LDLCALC 127 (H) 06/17/2021   Lab Results  Component Value Date   TRIG 96 06/17/2021   Lab Results  Component Value Date   CHOLHDL 3.9 06/17/2021   Lab Results  Component Value Date   HGBA1C 5.3 06/17/2021       Assessment & Plan:   Problem List Items Addressed This Visit   None Visit Diagnoses     Onychomycosis    -  Primary   Relevant Medications   ciclopirox (PENLAC) 8 % solution   Acute non-recurrent pansinusitis       Relevant Medications   ondansetron (ZOFRAN) 4 MG tablet      Patient has s/sx consistent with onychomycosis and has failed OTC antifungal therapy so will start topical ciclopriox 8% solution x 48 weeks. Discussed foot care. If symptoms fail to improve or worsen recommend referral to podiatry or trial of oral antifungal therapy.   Meds ordered this encounter  Medications   ciclopirox (PENLAC) 8 % solution    Sig: Apply topically at bedtime. Apply over nail and surrounding skin. Apply daily over previous coat. After seven (7) days, may remove with alcohol and continue cycle.    Dispense:  6 mL    Refill:  1    Order Specific Question:   Supervising Provider    Answer:   Beatrice Lecher D [[id number]]   ondansetron (ZOFRAN) 4 MG tablet    Sig: Take 1 tablet (4 mg total) by mouth every 8 (eight) hours as needed for nausea or vomiting.    Dispense:  20 tablet    Refill:  0    Order Specific Question:   Supervising Provider    Answer:   Beatrice Lecher D [[id number]]     Lorrene Reid, PA-C

## 2021-08-10 NOTE — Patient Instructions (Signed)

## 2021-10-06 ENCOUNTER — Other Ambulatory Visit: Payer: Self-pay | Admitting: Physician Assistant

## 2021-10-06 DIAGNOSIS — Z1231 Encounter for screening mammogram for malignant neoplasm of breast: Secondary | ICD-10-CM

## 2021-11-06 ENCOUNTER — Other Ambulatory Visit: Payer: Self-pay | Admitting: Physician Assistant

## 2021-11-16 ENCOUNTER — Ambulatory Visit: Payer: Medicare HMO

## 2021-11-17 ENCOUNTER — Ambulatory Visit
Admission: RE | Admit: 2021-11-17 | Discharge: 2021-11-17 | Disposition: A | Discharge status: Home / Self Care | Payer: Medicare HMO | Source: Ambulatory Visit | Attending: Physician Assistant | Admitting: Physician Assistant

## 2021-11-17 DIAGNOSIS — Z1231 Encounter for screening mammogram for malignant neoplasm of breast: Secondary | ICD-10-CM

## 2021-12-06 DIAGNOSIS — M5416 Radiculopathy, lumbar region: Secondary | ICD-10-CM | POA: Diagnosis not present

## 2021-12-26 DIAGNOSIS — M5416 Radiculopathy, lumbar region: Secondary | ICD-10-CM | POA: Diagnosis not present

## 2022-01-11 ENCOUNTER — Other Ambulatory Visit: Payer: Self-pay | Admitting: Physician Assistant

## 2022-01-11 DIAGNOSIS — J0140 Acute pansinusitis, unspecified: Secondary | ICD-10-CM

## 2022-01-19 DIAGNOSIS — M5432 Sciatica, left side: Secondary | ICD-10-CM | POA: Diagnosis not present

## 2022-01-19 DIAGNOSIS — M5416 Radiculopathy, lumbar region: Secondary | ICD-10-CM | POA: Diagnosis not present

## 2022-01-27 DIAGNOSIS — X32XXXD Exposure to sunlight, subsequent encounter: Secondary | ICD-10-CM | POA: Diagnosis not present

## 2022-01-27 DIAGNOSIS — D225 Melanocytic nevi of trunk: Secondary | ICD-10-CM | POA: Diagnosis not present

## 2022-01-27 DIAGNOSIS — D485 Neoplasm of uncertain behavior of skin: Secondary | ICD-10-CM | POA: Diagnosis not present

## 2022-01-27 DIAGNOSIS — L570 Actinic keratosis: Secondary | ICD-10-CM | POA: Diagnosis not present

## 2022-02-06 ENCOUNTER — Other Ambulatory Visit: Payer: Self-pay | Admitting: Physician Assistant

## 2022-02-23 ENCOUNTER — Encounter: Payer: Self-pay | Admitting: Physician Assistant

## 2022-02-23 ENCOUNTER — Ambulatory Visit (INDEPENDENT_AMBULATORY_CARE_PROVIDER_SITE_OTHER): Payer: Medicare HMO | Admitting: Physician Assistant

## 2022-02-23 VITALS — BP 112/69 | HR 75 | Temp 97.7°F | Ht 66.5 in | Wt 204.0 lb

## 2022-02-23 DIAGNOSIS — H00014 Hordeolum externum left upper eyelid: Secondary | ICD-10-CM

## 2022-02-23 DIAGNOSIS — B351 Tinea unguium: Secondary | ICD-10-CM | POA: Diagnosis not present

## 2022-02-23 MED ORDER — ERYTHROMYCIN 5 MG/GM OP OINT: 1.0000 | TOPICAL_OINTMENT | Freq: Three times a day (TID) | OPHTHALMIC | 0 refills | Status: DC

## 2022-02-23 MED ORDER — TERBINAFINE HCL 250 MG PO TABS: 250.0000 mg | ORAL_TABLET | Freq: Every day | ORAL | 0 refills | Status: DC

## 2022-02-23 NOTE — Progress Notes (Signed)
Established patient visit   Patient: Cindy Figueroa   DOB: 03/05/1951   70 y.o. Female  MRN: [id number] Visit Date: 02/23/2022  Chief Complaint  Patient presents with   Follow-up   Subjective    HPI  Patient presents for follow-up on onychomycosis. Patient reports stopped topical solution after 3 months of using it because it did not make a difference. Patient also has c/o left eye redness and tenderness. Now has a mass. Has been applying hot compresses. No fever or changes with vision.    Medications: Outpatient Medications Prior to Visit  Medication Sig   Biotin 5000 MCG CAPS Take 1 capsule by mouth daily.   losartan (COZAAR) 100 MG tablet TAKE 1 TABLET BY MOUTH EVERY DAY   ondansetron (ZOFRAN) 4 MG tablet TAKE 1 TABLET BY MOUTH EVERY 8 HOURS AS NEEDED FOR NAUSEA AND VOMITING   [DISCONTINUED] ciclopirox (PENLAC) 8 % solution Apply topically at bedtime. Apply over nail and surrounding skin. Apply daily over previous coat. After seven (7) days, may remove with alcohol and continue cycle.   No facility-administered medications prior to visit.    Review of Systems Review of Systems:  A fourteen system review of systems was performed and found to be positive as per HPI.     Objective    BP 112/69   Pulse 75   Temp 97.7 F (36.5 C)   Ht 5' 6.5" (1.689 m)   Wt 204 lb (92.5 kg)   SpO2 97%   BMI 32.43 kg/m  BP Readings from Last 3 Encounters:  02/23/22 112/69  08/10/21 130/74  06/20/21 129/82   Wt Readings from Last 3 Encounters:  02/23/22 204 lb (92.5 kg)  08/10/21 204 lb (92.5 kg)  06/20/21 201 lb (91.2 kg)    Physical Exam  General:  Pleasant and cooperative, appropriate for stated age.  Neuro:  Alert and oriented,  extra-ocular muscles intact  HEENT:  Normocephalic, atraumatic, erythema, swelling and tender mass at upper eyelid (left), normal right eyelid, PERRL, neck supple  Skin:  no gross rash, warm, pink. Cardiac:  RRR, S1 S2 Respiratory: CTA B/L   Vascular:  Ext warm, no cyanosis apprec.; cap RF less 2 sec. Extremities: Yellow nail discoloration and thickness of left great toe Psych:  No HI/SI, judgement and insight good, Euthymic mood. Full Affect.   No results found for any visits on 02/23/22.  Assessment & Plan      Problem List Items Addressed This Visit   None Visit Diagnoses     Onychomycosis    -  Primary   Relevant Medications   terbinafine (LAMISIL) 250 MG tablet   Other Relevant Orders   Comp Met (CMET)   Hordeolum externum of left upper eyelid       Relevant Medications   erythromycin ophthalmic ointment       Onychomycosis: -Patient has tried and failed topical antifungal therapy including Ciclopirox so will start oral antifungal therapy with terbinafine 250 mg daily x 12 weeks. Discussed with patient potential side effects. Will collect CMP to obtain baseline hepatic function and recommend repeating in 6 weeks.   Hordeolum externum of left upper eyelid: -Patient has s/sx consistent with stye. Will start topical antibiotic therapy x 1 week which can be extended to 2 weeks if needed, advised patient to let me know. Recommend to continue to apply warm compresses. Follow-up if symptoms worsen.   Return in about 4 months (around 06/26/2022) for MCW and FBW; lab visit in 6 weeks   for CMP for medication monitoring.        Maritza Abonza, PA-C  [facility] [phone] (phone) [phone] (fax)  [facility] 

## 2022-02-23 NOTE — Patient Instructions (Signed)
Stye ?A stye, also known as a hordeolum, is a bump that forms on an eyelid. It may look like a pimple next to the eyelash. A stye can form inside the eyelid (internal stye) or outside the eyelid (external stye). A stye can cause redness, swelling, and pain on the eyelid. ?Styes are very common. Anyone can get them at any age. They usually occur in just one eye at a time, but you may have more than one in either eye. ?What are the causes? ?A stye is caused by an infection. The infection is almost always caused by bacteria called Staphylococcus aureus. This is a common type of bacteria that lives on the skin. ?An internal stye may result from an infected oil-producing gland inside the eyelid. An external stye may be caused by an infection at the base of the eyelash (hair follicle). ?What increases the risk? ?You are more likely to develop a stye if: ?You have had a stye before. ?You have any of these conditions: ?Red, itchy, inflamed eyelids (blepharitis). ?A skin condition such as seborrheic dermatitis or rosacea. ?High fat levels in your blood (lipids). ?Dry eyes. ?What are the signs or symptoms? ?The most common symptom of a stye is eyelid pain. Internal styes are more painful than external styes. Other symptoms may include: ?Painful swelling of your eyelid. ?A scratchy feeling in your eye. ?Tearing and redness of your eye. ?A pimple-like bump on the edge of the eyelid. ?Pus draining from the stye. ?How is this diagnosed? ?Your health care provider may be able to diagnose a stye just by examining your eye. The health care provider may also check to make sure: ?You do not have a fever or other signs of a more serious infection. ?The infection has not spread to other parts of your eye or areas around your eye. ?How is this treated? ?Most styes will clear up in a few days without treatment or with warm compresses applied to the area. You may need to use antibiotic drops or ointment to treat an infection. Sometimes,  steroid drops or ointment are used in addition to antibiotics. ?In some cases, your health care provider may give you a small steroid injection in the eyelid. ?If your stye does not heal with routine treatment, your health care provider may drain pus from the stye using a thin blade or needle. This may be done if the stye is large, causing a lot of pain, or affecting your vision. ?Follow these instructions at home: ?Take over-the-counter and prescription medicines only as told by your health care provider. This includes eye drops or ointments. ?If you were prescribed an antibiotic medicine, steroid medicine, or both, apply or use them as told by your health care provider. Do not stop using the medicine even if your condition improves. ?Apply a warm, wet cloth (warm compress) to your eye for 5-10 minutes, 4 to 6 times a day. ?Clean the affected eyelid as directed by your health care provider. ?Do not wear contact lenses or eye makeup until your stye has healed and your health care provider says that it is safe. ?Do not try to pop or drain the stye. ?Do not rub your eye. ?Contact a health care provider if: ?You have chills or a fever. ?Your stye does not go away after several days. ?Your stye affects your vision. ?Your eyeball becomes swollen, red, or painful. ?Get help right away if: ?You have pain when moving your eye around. ?Summary ?A stye is a bump that forms   on an eyelid. It may look like a pimple next to the eyelash. ?A stye can form inside the eyelid (internal stye) or outside the eyelid (external stye). A stye can cause redness, swelling, and pain on the eyelid. ?Your health care provider may be able to diagnose a stye just by examining your eye. ?Apply a warm, wet cloth (warm compress) to your eye for 5-10 minutes, 4 to 6 times a day. ?This information is not intended to replace advice given to you by your health care provider. Make sure you discuss any questions you have with your health care  provider. ?Document Revised: 10/06/2020 Document Reviewed: 10/06/2020 ?Elsevier Patient Education ? 2023 Elsevier Inc[employer] ? ?

## 2022-02-24 LAB — COMPREHENSIVE METABOLIC PANEL
ALT: 18 IU/L (ref 0–32)
AST: 15 IU/L (ref 0–40)
Albumin/Globulin Ratio: 2.9 — ABNORMAL HIGH (ref 1.2–2.2)
Albumin: 4.4 g/dL (ref 3.8–4.8)
Alkaline Phosphatase: 86 IU/L (ref 44–121)
BUN/Creatinine Ratio: 18 (ref 12–28)
BUN: 11 mg/dL (ref 8–27)
Bilirubin Total: 0.6 mg/dL (ref 0.0–1.2)
CO2: 22 mmol/L (ref 20–29)
Calcium: 9.3 mg/dL (ref 8.7–10.3)
Chloride: 103 mmol/L (ref 96–106)
Creatinine, Ser: 0.61 mg/dL (ref 0.57–1.00)
Globulin, Total: 1.5 g/dL (ref 1.5–4.5)
Glucose: 99 mg/dL (ref 70–99)
Potassium: 3.9 mmol/L (ref 3.5–5.2)
Sodium: 140 mmol/L (ref 134–144)
Total Protein: 5.9 g/dL — ABNORMAL LOW (ref 6.0–8.5)
eGFR: 96 mL/min/{1.73_m2} (ref >59)

## 2022-02-25 DIAGNOSIS — M1990 Unspecified osteoarthritis, unspecified site: Secondary | ICD-10-CM | POA: Diagnosis not present

## 2022-02-25 DIAGNOSIS — Z825 Family history of asthma and other chronic lower respiratory diseases: Secondary | ICD-10-CM | POA: Diagnosis not present

## 2022-02-25 DIAGNOSIS — Z6832 Body mass index (BMI) 32.0-32.9, adult: Secondary | ICD-10-CM | POA: Diagnosis not present

## 2022-02-25 DIAGNOSIS — E785 Hyperlipidemia, unspecified: Secondary | ICD-10-CM | POA: Diagnosis not present

## 2022-02-25 DIAGNOSIS — Z008 Encounter for other general examination: Secondary | ICD-10-CM | POA: Diagnosis not present

## 2022-02-25 DIAGNOSIS — I10 Essential (primary) hypertension: Secondary | ICD-10-CM | POA: Diagnosis not present

## 2022-02-25 DIAGNOSIS — E669 Obesity, unspecified: Secondary | ICD-10-CM | POA: Diagnosis not present

## 2022-02-25 DIAGNOSIS — Z8249 Family history of ischemic heart disease and other diseases of the circulatory system: Secondary | ICD-10-CM | POA: Diagnosis not present

## 2022-02-25 DIAGNOSIS — M4800 Spinal stenosis, site unspecified: Secondary | ICD-10-CM | POA: Diagnosis not present

## 2022-02-25 DIAGNOSIS — Z85828 Personal history of other malignant neoplasm of skin: Secondary | ICD-10-CM | POA: Diagnosis not present

## 2022-02-25 DIAGNOSIS — R69 Illness, unspecified: Secondary | ICD-10-CM | POA: Diagnosis not present

## 2022-02-25 DIAGNOSIS — M5430 Sciatica, unspecified side: Secondary | ICD-10-CM | POA: Diagnosis not present

## 2022-02-25 DIAGNOSIS — Z801 Family history of malignant neoplasm of trachea, bronchus and lung: Secondary | ICD-10-CM | POA: Diagnosis not present

## 2022-04-25 DIAGNOSIS — D485 Neoplasm of uncertain behavior of skin: Secondary | ICD-10-CM | POA: Diagnosis not present

## 2022-04-25 DIAGNOSIS — L821 Other seborrheic keratosis: Secondary | ICD-10-CM | POA: Diagnosis not present

## 2022-05-09 ENCOUNTER — Other Ambulatory Visit: Payer: Self-pay | Admitting: Physician Assistant

## 2022-05-20 ENCOUNTER — Other Ambulatory Visit: Payer: Self-pay | Admitting: Physician Assistant

## 2022-05-20 DIAGNOSIS — J0140 Acute pansinusitis, unspecified: Secondary | ICD-10-CM

## 2022-06-26 DIAGNOSIS — M5416 Radiculopathy, lumbar region: Secondary | ICD-10-CM | POA: Diagnosis not present

## 2022-06-26 DIAGNOSIS — M5432 Sciatica, left side: Secondary | ICD-10-CM | POA: Diagnosis not present

## 2022-08-02 DIAGNOSIS — M5416 Radiculopathy, lumbar region: Secondary | ICD-10-CM | POA: Diagnosis not present

## 2022-08-30 DIAGNOSIS — M5416 Radiculopathy, lumbar region: Secondary | ICD-10-CM | POA: Diagnosis not present

## 2022-09-18 ENCOUNTER — Encounter: Payer: Self-pay | Admitting: Nurse Practitioner

## 2022-09-18 ENCOUNTER — Ambulatory Visit (INDEPENDENT_AMBULATORY_CARE_PROVIDER_SITE_OTHER): Payer: Medicare HMO | Admitting: Nurse Practitioner

## 2022-09-18 VITALS — BP 120/76 | HR 87 | Ht 66.5 in | Wt 198.1 lb

## 2022-09-18 DIAGNOSIS — W19XXXA Unspecified fall, initial encounter: Secondary | ICD-10-CM | POA: Diagnosis not present

## 2022-09-18 DIAGNOSIS — Y92009 Unspecified place in unspecified non-institutional (private) residence as the place of occurrence of the external cause: Secondary | ICD-10-CM

## 2022-09-18 DIAGNOSIS — B353 Tinea pedis: Secondary | ICD-10-CM | POA: Diagnosis not present

## 2022-09-18 DIAGNOSIS — L03115 Cellulitis of right lower limb: Secondary | ICD-10-CM

## 2022-09-18 MED ORDER — SULFAMETHOXAZOLE-TRIMETHOPRIM 800-160 MG PO TABS: 1.0000 | ORAL_TABLET | Freq: Two times a day (BID) | ORAL | 0 refills | Status: DC

## 2022-09-18 MED ORDER — FLUCONAZOLE 150 MG PO TABS: ORAL_TABLET | ORAL | 2 refills | Status: DC

## 2022-09-18 NOTE — Progress Notes (Signed)
Established patient visit   Patient: Cindy Figueroa   DOB: [birth year]   72 y.o. Female  MRN: [id number] Visit Date: 09/18/2022  Chief Complaint  Patient presents with   Fall   Subjective    HPI  Fall at home  -injured right lower leg --significant scrape on front of shin -red and painful.  -has noted some unusual drainage  from the  wound.   Medications: Outpatient Medications Prior to Visit  Medication Sig   losartan (COZAAR) 100 MG tablet TAKE 1 TABLET BY MOUTH EVERY DAY   ondansetron (ZOFRAN) 4 MG tablet TAKE 1 TABLET BY MOUTH EVERY 8 HOURS AS NEEDED FOR NAUSEA AND VOMITING   [DISCONTINUED] Biotin 5000 MCG CAPS Take 1 capsule by mouth daily. (Patient not taking: Reported on 10/06/2022)   [DISCONTINUED] erythromycin ophthalmic ointment Place 1 Application into the left eye 3 (three) times daily.   [DISCONTINUED] terbinafine (LAMISIL) 250 MG tablet Take 1 tablet (250 mg total) by mouth daily.   No facility-administered medications prior to visit.    Review of Systems See HPI     Objective     Today's Vitals   09/18/22 1534  BP: 120/76  Pulse: 87  SpO2: 97%  Weight: 198 lb 1.9 oz (89.9 kg)  Height: 5' 6.5" (1.689 m)   Body mass index is 31.5 kg/m.   Physical Exam Vitals and nursing note reviewed.  Constitutional:      Appearance: Normal appearance. She is well-developed.  HENT:     Head: Normocephalic and atraumatic.     Nose: Nose normal.     Mouth/Throat:     Mouth: Mucous membranes are moist.     Pharynx: Oropharynx is clear.  Eyes:     Extraocular Movements: Extraocular movements intact.     Conjunctiva/sclera: Conjunctivae normal.     Pupils: Pupils are equal, round, and reactive to light.  Cardiovascular:     Rate and Rhythm: Normal rate and regular rhythm.     Pulses: Normal pulses.     Heart sounds: Normal heart sounds.  Pulmonary:     Effort: Pulmonary effort is normal.     Breath sounds: Normal breath sounds.  Abdominal:     Palpations:  Abdomen is soft.  Musculoskeletal:        General: Normal range of motion.     Cervical back: Normal range of motion and neck supple.  Lymphadenopathy:     Cervical: No cervical adenopathy.  Skin:    General: Skin is warm and dry.     Capillary Refill: Capillary refill takes less than 2 seconds.       Neurological:     General: No focal deficit present.     Mental Status: She is alert and oriented to person, place, and time.  Psychiatric:        Mood and Affect: Mood normal.        Behavior: Behavior normal.        Thought Content: Thought content normal.        Judgment: Judgment normal.      Assessment & Plan     1. Fall in home, initial encounter Patient did have a fall at home resulting in traumatic abrasion to right lower extremity   2. Cellulitis of right lower leg Start bactrim DS twice daily for 10 days. Keep wound clean and dry. May continue to apply antibiotic ointment to area as needed.   3. Tinea pedis of left foot Persistent fungal infection of left  toes. Trial diflucan weekly. Reassess at next visit    Return in about 2 weeks (around 10/02/2022) for mood.        Ronnell Freshwater, NP  [facility] [phone] (phone) [phone] (fax)  [place]

## 2022-09-21 ENCOUNTER — Ambulatory Visit: Payer: Medicare HMO | Admitting: Orthopedic Surgery

## 2022-09-21 ENCOUNTER — Ambulatory Visit (INDEPENDENT_AMBULATORY_CARE_PROVIDER_SITE_OTHER): Payer: Medicare HMO

## 2022-09-21 DIAGNOSIS — M79604 Pain in right leg: Secondary | ICD-10-CM | POA: Diagnosis not present

## 2022-09-24 ENCOUNTER — Encounter: Payer: Self-pay | Admitting: Orthopedic Surgery

## 2022-09-24 NOTE — Progress Notes (Signed)
Office Visit Note   Patient: Cindy Figueroa           Date of Birth: [birth year]           MRN: [id number] Visit Date: 09/21/2022 Requested by: Velva Harman, [place],  [place] [id number] PCP: Velva Harman, PA  Subjective: Chief Complaint  Patient presents with   Right Leg - Injury    HPI: Cindy Figueroa is a 72 y.o. female who presents to the office reporting I right leg pain.  Date of injury 09/11/2022.  She was trying to catch her dog.  Foot caught on the sidewalk and she landed on her leg and sustained an abrasion injury to that proximal tibial region.  Doing well until 09/16/2022 when she noticed what she thought was her leg getting infected.  Patient currently is on Bactrim DS.  No fevers or chills..                ROS: All systems reviewed are negative as they relate to the chief complaint within the history of present illness.  Patient denies fevers or chills.  Assessment & Plan: Visit Diagnoses:  1. Pain in right leg     Plan: Impression is abrasion injury right leg with no fracture on radiographs and no evidence of fluctuance.  I think her leg is healing reasonably well.  Would likely hold off on the Polysporin due to her allergy to Neosporin.  I think in general this thing is healing up reasonably well on its own.  Plan for follow-up in 3 weeks to make sure.  Since she has started this course of antibiotics I think is reasonable to complete them.  No evidence of closed space infection at this time in that right leg.  Follow-Up Instructions: No follow-ups on file.   Orders:  Orders Placed This Encounter  Procedures   XR Tibia/Fibula Right   No orders of the defined types were placed in this encounter.     Procedures: No procedures performed   Clinical Data: No additional findings.  Objective: Vital Signs: There were no vitals taken for this visit.  Physical Exam:  Constitutional: Patient appears well-developed HEENT:   Head: Normocephalic Eyes:EOM are normal Neck: Normal range of motion Cardiovascular: Normal rate Pulmonary/chest: Effort normal Neurologic: Patient is alert Skin: Skin is warm Psychiatric: Patient has normal mood and affect  Ortho Exam: Ortho exam demonstrates granulation tissue over an area of about 6 x 6 cm and multiple places in the proximal lateral tibia.  No fluctuance present.  Dry eschar present over the abrasions.  No knee effusion.  No patellar bursal fluid is present.  Range of motion is full and weightbearing is possible.  Specialty Comments:  No specialty comments available.  Imaging: No results found.   PMFS History: Patient Active Problem List   Diagnosis Date Noted   Pain and swelling of left lower extremity 03/18/2021   Cellulitis and abscess of left lower extremity 03/06/2021   Traumatic abrasion 03/06/2021   History of COVID-19 10/06/2020   Upper respiratory tract infection due to COVID-19 virus 10/04/2020   Wheezing 10/04/2020   Nausea 10/04/2020   Open wound of left lower leg 01/09/2019   Pain in right upper arm 12/31/2018   PTSD (post-traumatic stress disorder) 10/21/2018   Anxiety in acute stress reaction 10/21/2018   Healthcare maintenance 06/06/2018   Screening for breast cancer 06/06/2018   Encounter for Medicare annual wellness exam 12/06/2017  Obesity with body mass index of 30.0-39.9 03/13/2017   Hyperlipidemia 03/13/2017   Vitamin D deficiency 01/11/2017   HTN (hypertension) 12/26/2016   Poor balance 12/26/2016   H/O fatigue 12/26/2016   Family history of high cholesterol 12/26/2016   Past Medical History:  Diagnosis Date   Hypertension     Family History  Problem Relation Age of Onset   Hyperlipidemia Mother    Hypertension Mother    Cancer Father    Emphysema Father     No past surgical history on file. Social History   Occupational History   Not on file  Tobacco Use   Smoking status: Never   Smokeless tobacco: Never   Vaping Use   Vaping Use: Never used  Substance and Sexual Activity   Alcohol use: No   Drug use: No   Sexual activity: Not on file

## 2022-09-27 ENCOUNTER — Ambulatory Visit: Payer: Medicare HMO | Admitting: Nurse Practitioner

## 2022-09-27 ENCOUNTER — Other Ambulatory Visit: Payer: Self-pay | Admitting: Nurse Practitioner

## 2022-09-27 DIAGNOSIS — L03115 Cellulitis of right lower limb: Secondary | ICD-10-CM

## 2022-09-28 ENCOUNTER — Other Ambulatory Visit: Payer: Self-pay | Admitting: Nurse Practitioner

## 2022-09-28 DIAGNOSIS — L03115 Cellulitis of right lower limb: Secondary | ICD-10-CM

## 2022-09-28 MED ORDER — SULFAMETHOXAZOLE-TRIMETHOPRIM 800-160 MG PO TABS: 1.0000 | ORAL_TABLET | Freq: Two times a day (BID) | ORAL | 0 refills | Status: DC

## 2022-09-28 NOTE — Telephone Encounter (Signed)
Pt is requesting another refill. Pt states overall about 85% better still redness at the center.   sulfamethoxazole-trimethoprim (BACTRIM DS) 800-160 MG tablet   Pharmacy: [id number]- [place], [place] - [address]

## 2022-09-28 NOTE — Telephone Encounter (Signed)
I sent prescription for additional 7 days. She should continue to keep the wound clean and dry.

## 2022-10-06 ENCOUNTER — Ambulatory Visit (INDEPENDENT_AMBULATORY_CARE_PROVIDER_SITE_OTHER): Payer: Medicare HMO | Admitting: Family Medicine

## 2022-10-06 ENCOUNTER — Encounter: Payer: Self-pay | Admitting: Family Medicine

## 2022-10-06 VITALS — BP 139/75 | HR 75 | Resp 18 | Ht 66.5 in | Wt 195.0 lb

## 2022-10-06 DIAGNOSIS — F411 Generalized anxiety disorder: Secondary | ICD-10-CM

## 2022-10-06 DIAGNOSIS — F430 Acute stress reaction: Secondary | ICD-10-CM

## 2022-10-06 DIAGNOSIS — B351 Tinea unguium: Secondary | ICD-10-CM

## 2022-10-06 DIAGNOSIS — B353 Tinea pedis: Secondary | ICD-10-CM

## 2022-10-06 DIAGNOSIS — L03115 Cellulitis of right lower limb: Secondary | ICD-10-CM

## 2022-10-06 DIAGNOSIS — R69 Illness, unspecified: Secondary | ICD-10-CM | POA: Diagnosis not present

## 2022-10-06 MED ORDER — DOXYCYCLINE HYCLATE 100 MG PO TABS: 100.0000 mg | ORAL_TABLET | Freq: Two times a day (BID) | ORAL | 0 refills | Status: AC

## 2022-10-06 MED ORDER — FLUCONAZOLE 150 MG PO TABS: ORAL_TABLET | ORAL | 2 refills | Status: DC

## 2022-10-06 MED ORDER — SERTRALINE HCL 25 MG PO TABS: 25.0000 mg | ORAL_TABLET | Freq: Every day | ORAL | 0 refills | Status: DC

## 2022-10-06 NOTE — Assessment & Plan Note (Signed)
PHQ-9 score of 9, GAD-7 score of 11 today.  She requested to start Zoloft as she does not want to start anything that is habit-forming.  We discussed that Zoloft is not habit-forming, that it may take up to 6 weeks to notice any effect, and that if is not working we can either increase dose or switch to a different SSRI.  We discussed that this can be either temporary or long-term, but should she ever wish to discontinue the medicine to let me know so that we can taper off of it rather than stopping it suddenly.  I provided educational handouts on the new medication.

## 2022-10-06 NOTE — Progress Notes (Addendum)
Established Patient Office Visit  Subjective   Patient ID: Cindy Figueroa, female    DOB: [birth year]  Age: 72 y.o. MRN: [id number]  Chief Complaint  Patient presents with   Depression   Fall    HPI Cindy Figueroa is a 72 y.o. female presenting today for follow up of right lower leg abrasion, toenail fungus, mood.  Her right lower leg continues to heal.  She finished her 3-week course of Bactrim.  It looks and feels a lot better than when it started and she is happy with how it is healing so far.  She does have a follow-up with Ortho as well in the next week. Her toenail fungus still has not completely resolved.  Previously she has tried topical agents.  Most recently she has done a course of Diflucan, she has 1 more dose to make it 2 months total.  It has had minimal improvement since starting. She is also had a lot of stress recently in her life and finds herself constantly worrying about things that she has no control over.  She has never taken any medication for anxiety before but is interested in starting.     10/06/2022   10:31 AM 09/18/2022    3:37 PM 08/10/2021    1:48 PM  Depression screen PHQ 2/9  Decreased Interest 3 2 1   Down, Depressed, Hopeless 3 1 1   PHQ - 2 Score 6 3 2   Altered sleeping 1 1 3   Tired, decreased energy 2 0 0  Change in appetite 0 0 1  Feeling bad or failure about yourself  0 0 0  Trouble concentrating 0 0 0  Moving slowly or fidgety/restless 0 0 0  Suicidal thoughts 0 0 0  PHQ-9 Score 9 4 6   Difficult doing work/chores Not difficult at all  Not difficult at all       10/06/2022   10:32 AM 09/18/2022    3:37 PM 08/10/2021    1:48 PM 06/20/2021    1:58 PM  GAD 7 : Generalized Anxiety Score  Nervous, Anxious, on Edge 3 2 1 3   Control/stop worrying 3 2 3 1   Worry too much - different things 3 2 3 1   Trouble relaxing 0 2 0 1  Restless 0 0 0 1  Easily annoyed or irritable 0 1 0 0  Afraid - awful might happen 2 2 1 3   Total GAD 7 Score 11 11  8 10   Anxiety Difficulty Not difficult at all  Not difficult at all Not difficult at all    ROS Negative unless otherwise noted in HPI   Objective:     BP 139/75 (BP Location: Left Arm, Patient Position: Sitting, Cuff Size: Large)   Pulse 75   Resp 18   Ht 5' 6.5" (1.689 m)   Wt 195 lb (88.5 kg)   SpO2 100%   BMI 31.00 kg/m   Physical Exam Constitutional:      General: She is not in acute distress.    Appearance: Normal appearance.  HENT:     Head: Normocephalic and atraumatic.  Pulmonary:     Effort: Pulmonary effort is normal. No respiratory distress.  Musculoskeletal:     Cervical back: Normal range of motion.  Skin:    General: Skin is warm and dry.     Findings: Abrasion (Healing abrasion on the proximal portion of the lower left leg.  It has completely and over and is a dark brown color.  No  weeping.) and erythema (Erythema surrounding abrasion.) present.  Neurological:     General: No focal deficit present.     Mental Status: She is alert and oriented to person, place, and time. Mental status is at baseline.  Psychiatric:        Mood and Affect: Mood normal.        Thought Content: Thought content normal.        Judgment: Judgment normal.     Assessment & Plan:  Cellulitis of right lower leg -     Doxycycline Hyclate; Take 1 tablet (100 mg total) by mouth 2 (two) times daily for 7 days.  Dispense: 14 tablet; Refill: 0  Onychomycosis -     Fluconazole; Take 1 tablet po once. May repeat dose in 3 days as needed for persistent symptoms.  Dispense: 4 tablet; Refill: 2  Tinea pedis of left foot -     Fluconazole; Take 1 tablet po once. May repeat dose in 3 days as needed for persistent symptoms.  Dispense: 4 tablet; Refill: 2  Anxiety in acute stress reaction Assessment & Plan: PHQ-9 score of 9, GAD-7 score of 11 today.  She requested to start Zoloft as she does not want to start anything that is habit-forming.  We discussed that Zoloft is not habit-forming, that  it may take up to 6 weeks to notice any effect, and that if is not working we can either increase dose or switch to a different SSRI.  We discussed that this can be either temporary or long-term, but should she ever wish to discontinue the medicine to let me know so that we can taper off of it rather than stopping it suddenly.  I provided educational handouts on the new medication.  Orders: -     Sertraline HCl; Take 1 tablet (25 mg total) by mouth daily.  Dispense: 90 tablet; Refill: 0  Her right lower leg is healing well, but given the surrounding redness of the area it is reasonable to try another antibiotic now that 3 weeks of Bactrim has not completely resolved it.  Given allergy to Cipro, we will start with a 1 week course of doxycycline.  She does also follow-up with Ortho next week.  She wants to try 1 more month of the Diflucan since it has resulted in some improvement and she is tolerating it well.  If it does not resolve the infection completely, we discussed that it may be worthwhile to try something like Lamisil.  Also discussed that infections of toenails can often take a very long time to be treated.  I reviewed previous note from Vincent Gros NP and previous note from orthopedic visit.  Return in about 6 weeks (around 11/17/2022) for follow-up new anxiety medication. .  She also needs to schedule a Medicare annual wellness visit in the next few months.  I spent 35 minutes on the day of the encounter to include pre-visit record review, face-to-face time with the patient and post visit ordering of test.  Melida Quitter, PA

## 2022-10-10 ENCOUNTER — Other Ambulatory Visit: Payer: Self-pay | Admitting: Family Medicine

## 2022-10-10 DIAGNOSIS — Z1231 Encounter for screening mammogram for malignant neoplasm of breast: Secondary | ICD-10-CM

## 2022-10-12 ENCOUNTER — Ambulatory Visit: Payer: Medicare HMO | Admitting: Orthopedic Surgery

## 2022-10-15 DIAGNOSIS — L03115 Cellulitis of right lower limb: Secondary | ICD-10-CM | POA: Insufficient documentation

## 2022-10-15 DIAGNOSIS — B353 Tinea pedis: Secondary | ICD-10-CM | POA: Insufficient documentation

## 2022-10-16 ENCOUNTER — Ambulatory Visit: Payer: Medicare HMO | Admitting: Orthopedic Surgery

## 2022-10-16 DIAGNOSIS — M79604 Pain in right leg: Secondary | ICD-10-CM

## 2022-10-17 ENCOUNTER — Encounter: Payer: Self-pay | Admitting: Orthopedic Surgery

## 2022-10-17 NOTE — Progress Notes (Signed)
   Office Visit Note   Patient: Cindy Figueroa           Date of Birth: 1950/12/21           MRN: [id number] Visit Date: 10/16/2022 Requested by: Velva Harman, [place],   [id number] PCP: Velva Harman, PA  Subjective: Chief Complaint  Patient presents with   Right Leg - Follow-up    HPI: Cindy Figueroa is a 72 y.o. female who presents to the office reporting right leg injury.  Date of injury 09/11/2022.  She has been prescribed by another provider antibiotics.  She has finished both prescriptions.  Overall she is doing well.  Denies any fevers or chills..                ROS: All systems reviewed are negative as they relate to the chief complaint within the history of present illness.  Patient denies fevers or chills.  Assessment & Plan: Visit Diagnoses:  1. Pain in right leg     Plan: Impression is healing right leg avulsion/laceration.  No evidence of infection.  Would not go on any more oral antibiotics.  This may take about another month to fully resolved.  Follow-up as needed  Follow-Up Instructions: No follow-ups on file.   Orders:  No orders of the defined types were placed in this encounter.  No orders of the defined types were placed in this encounter.     Procedures: No procedures performed   Clinical Data: No additional findings.  Objective: Vital Signs: There were no vitals taken for this visit.  Physical Exam:  Constitutional: Patient appears well-developed HEENT:  Head: Normocephalic Eyes:EOM are normal Neck: Normal range of motion Cardiovascular: Normal rate Pulmonary/chest: Effort normal Neurologic: Patient is alert Skin: Skin is warm Psychiatric: Patient has normal mood and affect  Ortho Exam: Ortho exam demonstrates granulation tissue around the proximal anterolateral aspect of the leg with improvement compared to prior visits.  No underlying fluctuance or drainage.  Knee itself has no effusion and  full range of motion which is painless.  Specialty Comments:  No specialty comments available.  Imaging: No results found.   PMFS History: Patient Active Problem List   Diagnosis Date Noted   Cellulitis of right lower leg 10/15/2022   Tinea pedis of left foot 10/15/2022   History of COVID-19 10/06/2020   PTSD (post-traumatic stress disorder) 10/21/2018   Anxiety in acute stress reaction 10/21/2018   Obesity with body mass index of 30.0-39.9 03/13/2017   Hyperlipidemia 03/13/2017   Vitamin D deficiency 01/11/2017   HTN (hypertension) 12/26/2016   Poor balance 12/26/2016   Family history of high cholesterol 12/26/2016   Past Medical History:  Diagnosis Date   Hypertension     Family History  Problem Relation Age of Onset   Hyperlipidemia Mother    Hypertension Mother    Cancer Father    Emphysema Father     No past surgical history on file. Social History   Occupational History   Not on file  Tobacco Use   Smoking status: Never    Passive exposure: Never   Smokeless tobacco: Never  Vaping Use   Vaping Use: Never used  Substance and Sexual Activity   Alcohol use: No   Drug use: No   Sexual activity: Not on file

## 2022-11-13 ENCOUNTER — Telehealth: Payer: Self-pay | Admitting: *Deleted

## 2022-11-13 DIAGNOSIS — J0140 Acute pansinusitis, unspecified: Secondary | ICD-10-CM

## 2022-11-13 NOTE — Telephone Encounter (Signed)
Pt calling requesting refill on below.     losartan (COZAAR) 100 MG tablet    ondansetron (ZOFRAN) 4 MG tablet   [id number] - [place], [place] [address]    LOV 10/06/22 ROV 11/17/22

## 2022-11-13 NOTE — Telephone Encounter (Signed)
LVM for pt to call office back to see what she was wanting to do from the message she left on VM.

## 2022-11-14 MED ORDER — LOSARTAN POTASSIUM 100 MG PO TABS: 100.0000 mg | ORAL_TABLET | Freq: Every day | ORAL | 0 refills | Status: DC

## 2022-11-14 MED ORDER — ONDANSETRON HCL 4 MG PO TABS: ORAL_TABLET | ORAL | 0 refills | Status: DC

## 2022-11-14 NOTE — Telephone Encounter (Signed)
Refill sent.

## 2022-11-17 ENCOUNTER — Ambulatory Visit (INDEPENDENT_AMBULATORY_CARE_PROVIDER_SITE_OTHER): Payer: Medicare HMO | Admitting: Family Medicine

## 2022-11-17 ENCOUNTER — Encounter: Payer: Self-pay | Admitting: Family Medicine

## 2022-11-17 VITALS — BP 125/81 | HR 59 | Resp 18 | Ht 66.5 in | Wt 201.0 lb

## 2022-11-17 DIAGNOSIS — Z131 Encounter for screening for diabetes mellitus: Secondary | ICD-10-CM | POA: Diagnosis not present

## 2022-11-17 DIAGNOSIS — E669 Obesity, unspecified: Secondary | ICD-10-CM | POA: Diagnosis not present

## 2022-11-17 DIAGNOSIS — R69 Illness, unspecified: Secondary | ICD-10-CM | POA: Diagnosis not present

## 2022-11-17 DIAGNOSIS — E785 Hyperlipidemia, unspecified: Secondary | ICD-10-CM

## 2022-11-17 DIAGNOSIS — E559 Vitamin D deficiency, unspecified: Secondary | ICD-10-CM

## 2022-11-17 DIAGNOSIS — F411 Generalized anxiety disorder: Secondary | ICD-10-CM

## 2022-11-17 DIAGNOSIS — R7309 Other abnormal glucose: Secondary | ICD-10-CM | POA: Diagnosis not present

## 2022-11-17 DIAGNOSIS — I10 Essential (primary) hypertension: Secondary | ICD-10-CM

## 2022-11-17 DIAGNOSIS — F430 Acute stress reaction: Secondary | ICD-10-CM

## 2022-11-17 MED ORDER — SERTRALINE HCL 50 MG PO TABS
50.0000 mg | ORAL_TABLET | Freq: Every day | ORAL | 0 refills | Status: DC
Start: 2022-11-24 — End: 2023-02-09

## 2022-11-17 NOTE — Assessment & Plan Note (Signed)
Last lipid panel: LDL 127, HDL 50, triglycerides 96.  It has been almost a year and a half since lipid panel was checked, repeating lipid panel today.  Depending on results, we may discuss starting statin medication.

## 2022-11-17 NOTE — Patient Instructions (Addendum)
Increase your Zoloft to 50 mg total each day.  You can finish off the current bottle that you have by taking 2 tablets each day until they are gone.  Then, I sent in a prescription for 50 mg tablets for you to pick up so that you can go back to taking just 1 of those tablets each day.  Since you are not taking the Zofran for nausea anymore, some things that can also be helpful are ginger or vitamin B6.  You can mix ginger into your tea if you like to have it that way.  Vitamin B6 supplements can be found over-the-counter.  Your appointment on April 30 is for your Medicare annual wellness visit.  This is a time for [place] to get to know you better as a whole person rather than having a visit only dedicated to medical issues.  Keep this appointment, and then I will also see you 6 weeks from now to follow-up on the medication change.

## 2022-11-17 NOTE — Assessment & Plan Note (Signed)
Stable.  Blood pressure essentially at goal in office today 125/81, goal of less than 130/80.  Continue losartan 100 mg daily.  CMP collected today for electrolytes and renal function.  Will continue to monitor.

## 2022-11-17 NOTE — Progress Notes (Signed)
Established Patient Office Visit  Subjective   Patient ID: Cindy Binningaulette F Riese, female    DOB: 08-12-1951  Age: 72 y.o. MRN: [id number]  Chief Complaint  Patient presents with   Anxiety   Depression    HPI Cindy Figueroa is a 72 y.o. female presenting today for follow up of mood. Mood: Patient is here to follow up for anxiety, currently managing with Zoloft. Taking medication without side effects, reports excellent compliance with treatment. Denies mood changes or SI/HI. She feels mood is stable since last visit. Denies chest pain, difficulty concentrating, dizziness, fatigue, insomnia, irritability, palpitations, panic attacks, racing thoughts, SOB, sweating. Denies anhedonia, depressed mood, difficulty concentrating, fatigue, feelings of worthlessness/guilt, hopelessness, hypersomnia, impaired memory, insomnia, psychomotor agitation, psychomotor retardation, recurrent thoughts of death, weight changes.     11/17/2022   10:56 AM 10/06/2022   10:31 AM 09/18/2022    3:37 PM  Depression screen PHQ 2/9  Decreased Interest 1 3 2   Down, Depressed, Hopeless 1 3 1   PHQ - 2 Score 2 6 3   Altered sleeping 0 1 1  Tired, decreased energy 1 2 0  Change in appetite 1 0 0  Feeling bad or failure about yourself  0 0 0  Trouble concentrating 0 0 0  Moving slowly or fidgety/restless 0 0 0  Suicidal thoughts 0 0 0  PHQ-9 Score 4 9 4   Difficult doing work/chores Not difficult at all Not difficult at all        11/17/2022   10:56 AM 10/06/2022   10:32 AM 09/18/2022    3:37 PM 08/10/2021    1:48 PM  GAD 7 : Generalized Anxiety Score  Nervous, Anxious, on Edge 2 3 2 1   Control/stop worrying 2 3 2 3   Worry too much - different things 2 3 2 3   Trouble relaxing 1 0 2 0  Restless 0 0 0 0  Easily annoyed or irritable 0 0 1 0  Afraid - awful might happen 2 2 2 1   Total GAD 7 Score 9 11 11 8   Anxiety Difficulty Not difficult at all Not difficult at all  Not difficult at all   ROS Negative unless  otherwise noted in HPI   Objective:     BP 125/81 (BP Location: Left Arm, Patient Position: Sitting, Cuff Size: Normal)   Pulse (!) 59   Resp 18   Ht 5' 6.5" (1.689 m)   Wt 201 lb (91.2 kg)   SpO2 95%   BMI 31.96 kg/m   Physical Exam Constitutional:      General: She is not in acute distress.    Appearance: Normal appearance.  HENT:     Head: Normocephalic and atraumatic.  Cardiovascular:     Rate and Rhythm: Normal rate and regular rhythm.     Heart sounds: Normal heart sounds. No murmur heard.    No friction rub. No gallop.  Pulmonary:     Effort: Pulmonary effort is normal. No respiratory distress.     Breath sounds: No wheezing, rhonchi or rales.  Feet:     Comments: Toe fungus has greatly improved since last visit Skin:    General: Skin is warm and dry.  Neurological:     Mental Status: She is alert and oriented to person, place, and time.     Assessment & Plan:  Anxiety in acute stress reaction Assessment & Plan: PHQ-9 score improved from 9 to 4, GAD-7 score improved from 11 to 9.  We discussed that  her scores have improved which indicate that the Zoloft is helping some, but since she has not felt the symptomatic relief it is reasonable to increase to Zoloft 50 mg daily.  Patient is agreeable to this plan, we will follow-up in 6 weeks.  Orders: -     Sertraline HCl; Take 1 tablet (50 mg total) by mouth daily.  Dispense: 90 tablet; Refill: 0  Hypertension, unspecified type Assessment & Plan: Stable.  Blood pressure essentially at goal in office today 125/81, goal of less than 130/80.  Continue losartan 100 mg daily.  CMP collected today for electrolytes and renal function.  Will continue to monitor.  Orders: -     CBC with Differential/Platelet; Future -     Comprehensive metabolic panel; Future  Hyperlipidemia, unspecified hyperlipidemia type Assessment & Plan: Last lipid panel: LDL 127, HDL 50, triglycerides 96.  It has been almost a year and a half since  lipid panel was checked, repeating lipid panel today.  Depending on results, we may discuss starting statin medication.  Orders: -     Lipid panel; Future  Obesity with body mass index of 30.0-39.9 Assessment & Plan: Encouraged heart healthy diet and regular exercise.  Annual wellness visit at the end of this month to check-in.  Orders: -     Lipid panel; Future -     Hemoglobin A1c; Future  Vitamin D deficiency Assessment & Plan: Checking vitamin D levels today.  Orders: -     VITAMIN D 25 Hydroxy (Vit-D Deficiency, Fractures); Future  She has 3 more weeks left on her Diflucan.  We discussed that this will likely be enough to resolve toenail fungus since it is the end of the recommended course.  Patient has annual wellness visit scheduled for 12/12/2022. Return in about 6 weeks (around 12/29/2022) for follow-up for Zoloft increase.    Melida Quitter, PA

## 2022-11-17 NOTE — Assessment & Plan Note (Signed)
PHQ-9 score improved from 9 to 4, GAD-7 score improved from 11 to 9.  We discussed that her scores have improved which indicate that the Zoloft is helping some, but since she has not felt the symptomatic relief it is reasonable to increase to Zoloft 50 mg daily.  Patient is agreeable to this plan, we will follow-up in 6 weeks.

## 2022-11-17 NOTE — Assessment & Plan Note (Signed)
Encouraged heart healthy diet and regular exercise.  Annual wellness visit at the end of this month to check-in.

## 2022-11-17 NOTE — Assessment & Plan Note (Signed)
Checking vitamin D levels today. 

## 2022-11-18 LAB — COMPREHENSIVE METABOLIC PANEL
ALT: 20 IU/L (ref 0–32)
AST: 18 IU/L (ref 0–40)
Albumin/Globulin Ratio: 2.3 — ABNORMAL HIGH (ref 1.2–2.2)
Albumin: 4.3 g/dL (ref 3.8–4.8)
Alkaline Phosphatase: 91 IU/L (ref 44–121)
BUN/Creatinine Ratio: 18 (ref 12–28)
BUN: 11 mg/dL (ref 8–27)
Bilirubin Total: 0.5 mg/dL (ref 0.0–1.2)
CO2: 22 mmol/L (ref 20–29)
Calcium: 9.5 mg/dL (ref 8.7–10.3)
Chloride: 105 mmol/L (ref 96–106)
Creatinine, Ser: 0.62 mg/dL (ref 0.57–1.00)
Globulin, Total: 1.9 g/dL (ref 1.5–4.5)
Glucose: 88 mg/dL (ref 70–99)
Potassium: 4.5 mmol/L (ref 3.5–5.2)
Sodium: 142 mmol/L (ref 134–144)
Total Protein: 6.2 g/dL (ref 6.0–8.5)
eGFR: 95 mL/min/{1.73_m2} (ref >59)

## 2022-11-18 LAB — CBC WITH DIFFERENTIAL/PLATELET
Basophils Absolute: 0 10*3/uL (ref 0.0–0.2)
Basos: 1 %
EOS (ABSOLUTE): 0.3 10*3/uL (ref 0.0–0.4)
Eos: 5 %
Hematocrit: 42.7 % (ref 34.0–46.6)
Hemoglobin: 13.8 g/dL (ref 11.1–15.9)
Immature Grans (Abs): 0 10*3/uL (ref 0.0–0.1)
Immature Granulocytes: 0 %
Lymphocytes Absolute: 1.8 10*3/uL (ref 0.7–3.1)
Lymphs: 29 %
MCH: 28.9 pg (ref 26.6–33.0)
MCHC: 32.3 g/dL (ref 31.5–35.7)
MCV: 89 fL (ref 79–97)
Monocytes Absolute: 0.4 10*3/uL (ref 0.1–0.9)
Monocytes: 6 %
Neutrophils Absolute: 3.8 10*3/uL (ref 1.4–7.0)
Neutrophils: 59 %
Platelets: 285 10*3/uL (ref 150–450)
RBC: 4.78 x10E6/uL (ref 3.77–5.28)
RDW: 13.1 % (ref 11.7–15.4)
WBC: 6.4 10*3/uL (ref 3.4–10.8)

## 2022-11-18 LAB — LIPID PANEL
Chol/HDL Ratio: 3.9 ratio (ref 0.0–4.4)
Cholesterol, Total: 209 mg/dL — ABNORMAL HIGH (ref 100–199)
HDL: 54 mg/dL (ref >39)
LDL Chol Calc (NIH): 144 mg/dL — ABNORMAL HIGH (ref 0–99)
Triglycerides: 64 mg/dL (ref 0–149)
VLDL Cholesterol Cal: 11 mg/dL (ref 5–40)

## 2022-11-18 LAB — HEMOGLOBIN A1C
Est. average glucose Bld gHb Est-mCnc: 111 mg/dL
Hgb A1c MFr Bld: 5.5 % (ref 4.8–5.6)

## 2022-11-18 LAB — VITAMIN D 25 HYDROXY (VIT D DEFICIENCY, FRACTURES): Vit D, 25-Hydroxy: 15.6 ng/mL — ABNORMAL LOW (ref 30.0–100.0)

## 2022-11-21 MED ORDER — VITAMIN D (ERGOCALCIFEROL) 1.25 MG (50000 UNIT) PO CAPS
50000.0000 [IU] | ORAL_CAPSULE | ORAL | 0 refills | Status: DC
Start: 2022-11-21 — End: 2023-02-09

## 2022-11-21 NOTE — Addendum Note (Signed)
Addended by: Saralyn Pilar on: 11/21/2022 12:07 PM   Modules accepted: Orders

## 2022-11-28 ENCOUNTER — Ambulatory Visit
Admission: RE | Admit: 2022-11-28 | Discharge: 2022-11-28 | Disposition: A | Discharge status: Home / Self Care | Payer: Medicare HMO | Source: Ambulatory Visit | Attending: Family Medicine | Admitting: Family Medicine

## 2022-11-28 DIAGNOSIS — Z1231 Encounter for screening mammogram for malignant neoplasm of breast: Secondary | ICD-10-CM | POA: Diagnosis not present

## 2022-12-07 ENCOUNTER — Encounter: Payer: Medicare HMO | Admitting: Family Medicine

## 2022-12-12 ENCOUNTER — Encounter: Payer: Medicare HMO | Admitting: Family Medicine

## 2022-12-14 ENCOUNTER — Telehealth: Payer: Self-pay | Admitting: *Deleted

## 2022-12-14 NOTE — Telephone Encounter (Signed)
Pt informed of below and she said she would do what provider recommended. Khyler Urda Zimmerman Rumple, CMA

## 2022-12-14 NOTE — Telephone Encounter (Signed)
I am sorry to hear that she has been jittery and nervous with the increase.  Since we increased her dose last time to help her with some of the mood related symptoms she was having, I am inclined to think that these side effects are most likely due to the increased dose.  Most of the time, the side effects are transient and go away after a little while.  I would encourage her to try to continue at her current dose until I see her at her in person visit on 12/29/2022.  Her other option at this point is to go back to her previous dose of Zoloft 25 mg daily as I do not want to completely take her off of it without discussing something else to start, which we can do at her next appointment.  I would prefer that she waits it out for another couple of weeks, I am hopeful that the symptoms will improve over time.

## 2022-12-14 NOTE — Telephone Encounter (Signed)
Pt calling to see what she should do to quit taking the zoloft, she said that the dose before she couldn't tell any difference and with the current dose she is having a lot of symptoms like more jittery and nervous, afraid and doesn't know why.  She said she was taking mom in about 30 minutes to get hair done and we could call her after 2 either at [phone] or at her moms at [phone]. Please advise.

## 2022-12-21 ENCOUNTER — Ambulatory Visit (INDEPENDENT_AMBULATORY_CARE_PROVIDER_SITE_OTHER): Payer: Medicare HMO

## 2022-12-21 VITALS — Ht 66.5 in | Wt 198.0 lb

## 2022-12-21 DIAGNOSIS — Z1159 Encounter for screening for other viral diseases: Secondary | ICD-10-CM

## 2022-12-21 DIAGNOSIS — Z0000 Encounter for general adult medical examination without abnormal findings: Secondary | ICD-10-CM | POA: Diagnosis not present

## 2022-12-21 NOTE — Patient Instructions (Addendum)
Cindy Figueroa , Thank you for taking time to come for your Medicare Wellness Visit. I appreciate your ongoing commitment to your health goals. Please review the following plan we discussed and let me know if I can assist you in the future.   These are the goals we discussed:  Goals       Lose weight (pt-stated)        This is a list of the screening recommended for you and due dates:  Health Maintenance  Topic Date Due   Zoster (Shingles) Vaccine (1 of 2) 01/04/2023*   COVID-19 Vaccine (1) 01/06/2023*   Pneumonia Vaccine (1 of 1 - PCV) 10/07/2023*   DEXA scan (bone density measurement)  10/07/2023*   Hepatitis C Screening: USPSTF Recommendation to screen - Ages 18-79 yo.  12/21/2023*   Flu Shot  03/15/2023   Medicare Annual Wellness Visit  12/21/2023   Mammogram  11/27/2024   Colon Cancer Screening  12/29/2027   DTaP/Tdap/Td vaccine (3 - Td or Tdap) 10/13/2032   HPV Vaccine  Aged Out  *Topic was postponed. The date shown is not the original due date.    Advanced directives: Please bring a copy of your health care power of attorney and living will to the office to be added to your chart at your convenience.   Conditions/risks identified: None  Next appointment: Follow up in one year for your annual wellness visit    Preventive Care 65 Years and Older, Female Preventive care refers to lifestyle choices and visits with your health care provider that can promote health and wellness. What does preventive care include? A yearly physical exam. This is also called an annual well check. Dental exams once or twice a year. Routine eye exams. Ask your health care provider how often you should have your eyes checked. Personal lifestyle choices, including: Daily care of your teeth and gums. Regular physical activity. Eating a healthy diet. Avoiding tobacco and drug use. Limiting alcohol use. Practicing safe sex. Taking low-dose aspirin every day. Taking vitamin and mineral supplements  as recommended by your health care provider. What happens during an annual well check? The services and screenings done by your health care provider during your annual well check will depend on your age, overall health, lifestyle risk factors, and family history of disease. Counseling  Your health care provider may ask you questions about your: Alcohol use. Tobacco use. Drug use. Emotional well-being. Home and relationship well-being. Sexual activity. Eating habits. History of falls. Memory and ability to understand (cognition). Work and work Astronomer. Reproductive health. Screening  You may have the following tests or measurements: Height, weight, and BMI. Blood pressure. Lipid and cholesterol levels. These may be checked every 5 years, or more frequently if you are over 9 years old. Skin check. Lung cancer screening. You may have this screening every year starting at age 13 if you have a 30-pack-year history of smoking and currently smoke or have quit within the past 15 years. Fecal occult blood test (FOBT) of the stool. You may have this test every year starting at age 62. Flexible sigmoidoscopy or colonoscopy. You may have a sigmoidoscopy every 5 years or a colonoscopy every 10 years starting at age 41. Hepatitis C blood test. Hepatitis B blood test. Sexually transmitted disease (STD) testing. Diabetes screening. This is done by checking your blood sugar (glucose) after you have not eaten for a while (fasting). You may have this done every 1-3 years. Bone density scan. This is done to  screen for osteoporosis. You may have this done starting at age 71. Mammogram. This may be done every 1-2 years. Talk to your health care provider about how often you should have regular mammograms. Talk with your health care provider about your test results, treatment options, and if necessary, the need for more tests. Vaccines  Your health care provider may recommend certain vaccines, such  as: Influenza vaccine. This is recommended every year. Tetanus, diphtheria, and acellular pertussis (Tdap, Td) vaccine. You may need a Td booster every 10 years. Zoster vaccine. You may need this after age 59. Pneumococcal 13-valent conjugate (PCV13) vaccine. One dose is recommended after age 38. Pneumococcal polysaccharide (PPSV23) vaccine. One dose is recommended after age 68. Talk to your health care provider about which screenings and vaccines you need and how often you need them. This information is not intended to replace advice given to you by your health care provider. Make sure you discuss any questions you have with your health care provider. Document Released: 08/27/2015 Document Revised: 04/19/2016 Document Reviewed: 06/01/2015 Elsevier Interactive Patient Education  2017 [place] Prevention in the Home Falls can cause injuries. They can happen to people of all ages. There are many things you can do to make your home safe and to help prevent falls. What can I do on the outside of my home? Regularly fix the edges of walkways and driveways and fix any cracks. Remove anything that might make you trip as you walk through a door, such as a raised step or threshold. Trim any bushes or trees on the path to your home. Use bright outdoor lighting. Clear any walking paths of anything that might make someone trip, such as rocks or tools. Regularly check to see if handrails are loose or broken. Make sure that both sides of any steps have handrails. Any raised decks and porches should have guardrails on the edges. Have any leaves, snow, or ice cleared regularly. Use sand or salt on walking paths during winter. Clean up any spills in your garage right away. This includes oil or grease spills. What can I do in the bathroom? Use night lights. Install grab bars by the toilet and in the tub and shower. Do not use towel bars as grab bars. Use non-skid mats or decals in the tub or  shower. If you need to sit down in the shower, use a plastic, non-slip stool. Keep the floor dry. Clean up any water that spills on the floor as soon as it happens. Remove soap buildup in the tub or shower regularly. Attach bath mats securely with double-sided non-slip rug tape. Do not have throw rugs and other things on the floor that can make you trip. What can I do in the bedroom? Use night lights. Make sure that you have a light by your bed that is easy to reach. Do not use any sheets or blankets that are too big for your bed. They should not hang down onto the floor. Have a firm chair that has side arms. You can use this for support while you get dressed. Do not have throw rugs and other things on the floor that can make you trip. What can I do in the kitchen? Clean up any spills right away. Avoid walking on wet floors. Keep items that you use a lot in easy-to-reach places. If you need to reach something above you, use a strong step stool that has a grab bar. Keep electrical cords out of the way.  Do not use floor polish or wax that makes floors slippery. If you must use wax, use non-skid floor wax. Do not have throw rugs and other things on the floor that can make you trip. What can I do with my stairs? Do not leave any items on the stairs. Make sure that there are handrails on both sides of the stairs and use them. Fix handrails that are broken or loose. Make sure that handrails are as long as the stairways. Check any carpeting to make sure that it is firmly attached to the stairs. Fix any carpet that is loose or worn. Avoid having throw rugs at the top or bottom of the stairs. If you do have throw rugs, attach them to the floor with carpet tape. Make sure that you have a light switch at the top of the stairs and the bottom of the stairs. If you do not have them, ask someone to add them for you. What else can I do to help prevent falls? Wear shoes that: Do not have high heels. Have  rubber bottoms. Are comfortable and fit you well. Are closed at the toe. Do not wear sandals. If you use a stepladder: Make sure that it is fully opened. Do not climb a closed stepladder. Make sure that both sides of the stepladder are locked into place. Ask someone to hold it for you, if possible. Clearly mark and make sure that you can see: Any grab bars or handrails. First and last steps. Where the edge of each step is. Use tools that help you move around (mobility aids) if they are needed. These include: Canes. Walkers. Scooters. Crutches. Turn on the lights when you go into a dark area. Replace any light bulbs as soon as they burn out. Set up your furniture so you have a clear path. Avoid moving your furniture around. If any of your floors are uneven, fix them. If there are any pets around you, be aware of where they are. Review your medicines with your doctor. Some medicines can make you feel dizzy. This can increase your chance of falling. Ask your doctor what other things that you can do to help prevent falls. This information is not intended to replace advice given to you by your health care provider. Make sure you discuss any questions you have with your health care provider. Document Released: 05/27/2009 Document Revised: 01/06/2016 Document Reviewed: 09/04/2014 Elsevier Interactive Patient Education  2017 [employer].

## 2022-12-21 NOTE — Progress Notes (Signed)
Subjective:   Cindy Figueroa is a 72 y.o. female who presents for Medicare Annual (Subsequent) preventive examination.  Review of Systems    Virtual Visit via Telephone Note  I connected with  [name] on 12/21/22 at  1:30 PM EDT by telephone and verified that I am speaking with the correct person using two identifiers.  Location: Patient: Home Provider: Office Persons participating in the virtual visit: patient/Nurse Health Advisor   I discussed the limitations, risks, security and privacy concerns of performing an evaluation and management service by telephone and the availability of in person appointments. The patient expressed understanding and agreed to proceed.  Interactive audio and video telecommunications were attempted between this nurse and patient, however failed, due to patient having technical difficulties OR patient did not have access to video capability.  We continued and completed visit with audio only.  Some vital signs may be absent or patient reported.   [name], LPN  Cardiac Risk Factors include: advanced age (>78men, >104 women);hypertension     Objective:    Today's Vitals   12/21/22 1336  Weight: 198 lb (89.8 kg)  Height: 5' 6.5" (1.689 m)   Body mass index is 31.48 kg/m.     12/21/2022    1:49 PM 12/26/2016   10:37 AM  Advanced Directives  Does Patient Have a Medical Advance Directive? Yes No  Type of Estate agent of [place];Living will   Copy of Healthcare Power of Attorney in Chart? No - copy requested     Current Medications (verified) Outpatient Encounter Medications as of 12/21/2022  Medication Sig   betamethasone dipropionate (DIPROLENE) 0.05 % ointment Apply 1 Application topically 2 (two) times daily.   fluconazole (DIFLUCAN) 150 MG tablet Take 1 tablet po once. May repeat dose in 3 days as needed for persistent symptoms.   losartan (COZAAR) 100 MG tablet Take 1 tablet (100 mg total) by mouth  daily.   sertraline (ZOLOFT) 50 MG tablet Take 1 tablet (50 mg total) by mouth daily.   Vitamin D, Ergocalciferol, (DRISDOL) 1.25 MG (50000 UNIT) CAPS capsule Take 1 capsule (50,000 Units total) by mouth every 7 (seven) days.   No facility-administered encounter medications on file as of 12/21/2022.    Allergies (verified) Ciprofloxacin, Penicillins, and Plavix [clopidogrel bisulfate]   History: Past Medical History:  Diagnosis Date   Hypertension    History reviewed. No pertinent surgical history. Family History  Problem Relation Age of Onset   Hyperlipidemia Mother    Hypertension Mother    Cancer Father    Emphysema Father    Social History   Socioeconomic History   Marital status: Married    Spouse name: Not on file   Number of children: Not on file   Years of education: Not on file   Highest education level: Not on file  Occupational History   Not on file  Tobacco Use   Smoking status: Never    Passive exposure: Never   Smokeless tobacco: Never  Vaping Use   Vaping Use: Never used  Substance and Sexual Activity   Alcohol use: No   Drug use: No   Sexual activity: Not on file  Other Topics Concern   Not on file  Social History Narrative   Not on file   Social Determinants of Health   Financial Resource Strain: Low Risk  (12/21/2022)   Overall Financial Resource Strain (CARDIA)    Difficulty of Paying Living Expenses: Not hard at  all  Food Insecurity: No Food Insecurity (12/21/2022)   Hunger Vital Sign    Worried About Running Out of Food in the Last Year: Never true    Ran Out of Food in the Last Year: Never true  Transportation Needs: No Transportation Needs (12/21/2022)   PRAPARE - Administrator, Civil Service (Medical): No    Lack of Transportation (Non-Medical): No  Physical Activity: Inactive (12/21/2022)   Exercise Vital Sign    Days of Exercise per Week: 0 days    Minutes of Exercise per Session: 0 min  Stress: No Stress Concern Present  (12/21/2022)   [employer] of Occupational Health - Occupational Stress Questionnaire    Feeling of Stress : Not at all  Social Connections: Socially Integrated (12/21/2022)   Social Connection and Isolation Panel [NHANES]    Frequency of Communication with Friends and Family: More than three times a week    Frequency of Social Gatherings with Friends and Family: More than three times a week    Attends Religious Services: More than 4 times per year    Active Member of [employer] or Organizations: Yes    Attends Engineer, structural: More than 4 times per year    Marital Status: Married    Tobacco Counseling Counseling given: Not Answered   Clinical Intake:  Pre-visit preparation completed: No  Pain : No/denies pain     BMI - recorded: 31.48 Nutritional Status: BMI > 30  Obese Nutritional Risks: None Diabetes: No  How often do you need to have someone help you when you read instructions, pamphlets, or other written materials from your doctor or pharmacy?: 1 - Never  Diabetic?  No  Interpreter Needed?: No  Information entered by :: Helyn App LPN   Activities of Daily Living    12/21/2022    1:46 PM 09/18/2022    3:38 PM  In your present state of health, do you have any difficulty performing the following activities:  Hearing? 0 0  Vision? 0 0  Difficulty concentrating or making decisions? 0 0  Walking or climbing stairs? 0 0  Dressing or bathing? 0 0  Doing errands, shopping? 0 0  Preparing Food and eating ? N   Using the Toilet? N   In the past six months, have you accidently leaked urine? N   Do you have problems with loss of bowel control? N   Managing your Medications? N   Managing your Finances? N   Housekeeping or managing your Housekeeping? N     Patient Care Team: Melida Quitter, PA as PCP - General (Family Medicine) Nita Sells, MD (Dermatology) Patricia Nettle, MD (Orthopedic Surgery)  Indicate any recent Medical Services you may have  received from other than [facility] providers in the past year (date may be approximate).     Assessment:   This is a routine wellness examination for [name].  Hearing/Vision screen Hearing Screening - Comments:: Denies hearing difficulties   Vision Screening - Comments:: Not up to date with routine eye exams. Patient deferred     Dietary issues and exercise activities discussed: Exercise limited by: None identified   Goals Addressed               This Visit's Progress     Lose weight (pt-stated)        Depression Screen    12/21/2022    1:45 PM 11/17/2022   10:56 AM 10/06/2022   10:31 AM 09/18/2022  3:37 PM 08/10/2021    1:48 PM 06/20/2021    1:58 PM 02/24/2021    1:18 PM  PHQ 2/9 Scores  PHQ - 2 Score 0 2 6 3 2 1  0  PHQ- 9 Score 0 4 9 4 6 4 3     Fall Risk    12/21/2022    1:47 PM 11/17/2022   10:56 AM 09/18/2022    3:38 PM 08/10/2021    1:47 PM 06/20/2021    1:57 PM  Fall Risk   Falls in the past year? 1 0 1 1 1   Number falls in past yr: 0 0 1 0 0  Injury with Fall? 1 0 1 1 1   Comment Scraped rt leg. Followed by medical attention      Risk for fall due to : No Fall Risks History of fall(s)  History of fall(s);Impaired balance/gait History of fall(s);Impaired balance/gait  Follow up Falls prevention discussed  Falls evaluation completed Falls evaluation completed Falls evaluation completed    FALL RISK PREVENTION PERTAINING TO THE HOME:  Any stairs in or around the home? Yes  If so, are there any without handrails? No  Home free of loose throw rugs in walkways, pet beds, electrical cords, etc? Yes  Adequate lighting in your home to reduce risk of falls? Yes   ASSISTIVE DEVICES UTILIZED TO PREVENT FALLS:  Life alert? No  Use of a cane, walker or w/c? No  Grab bars in the bathroom? No  Shower chair or bench in shower? Yes Elevated toilet seat or a handicapped toilet? No   TIMED UP AND GO:  Was the test performed? No . Audio Visit   Cognitive Function:         12/21/2022    1:49 PM 06/20/2021    1:45 PM  6CIT Screen  What Year? 0 points 0 points  What month? 0 points 0 points  What time? 0 points 0 points  Count back from 20 0 points 0 points  Months in reverse 0 points 0 points  Repeat phrase 0 points 0 points  Total Score 0 points 0 points    Immunizations Immunization History  Administered Date(s) Administered   Tdap 12/26/2016, 10/14/2022    TDAP status: Up to date  Flu Vaccine status: Up to date  Pneumococcal vaccine status: Due, Education has been provided regarding the importance of this vaccine. Advised may receive this vaccine at local pharmacy or Health Dept. Aware to provide a copy of the vaccination record if obtained from local pharmacy or Health Dept. Verbalized acceptance and understanding.  Covid-19 vaccine status: Declined, Education has been provided regarding the importance of this vaccine but patient still declined. Advised may receive this vaccine at local pharmacy or Health Dept.or vaccine clinic. Aware to provide a copy of the vaccination record if obtained from local pharmacy or Health Dept. Verbalized acceptance and understanding.  Qualifies for Shingles Vaccine? Yes   Zostavax completed No   Shingrix Completed?: No.    Education has been provided regarding the importance of this vaccine. Patient has been advised to call insurance company to determine out of pocket expense if they have not yet received this vaccine. Advised may also receive vaccine at local pharmacy or Health Dept. Verbalized acceptance and understanding.  Screening Tests Health Maintenance  Topic Date Due   Zoster Vaccines- Shingrix (1 of 2) 01/04/2023 (Originally 04/03/2000)   COVID-19 Vaccine (1) 01/06/2023 (Originally 10/04/1951)   Pneumonia Vaccine 18+ Years old (1 of 1 - PCV) 10/07/2023 (  Originally 04/04/2015)   DEXA SCAN  10/07/2023 (Originally 04/04/2015)   Hepatitis C Screening  12/21/2023 (Originally [birth year]03/1968)   INFLUENZA VACCINE   03/15/2023   Medicare Annual Wellness (AWV)  12/21/2023   MAMMOGRAM  11/27/2024   COLONOSCOPY (Pts 45-19yrs Insurance coverage will need to be confirmed)  12/29/2027   DTaP/Tdap/Td (3 - Td or Tdap) 10/13/2032   HPV VACCINES  Aged Out    Health Maintenance  There are no preventive care reminders to display for this patient.   Colorectal cancer screening: Type of screening: Colonoscopy. Completed 12/28/17. Repeat every 10 years  Mammogram status: Completed 11/28/22. Repeat every year    Lung Cancer Screening: (Low Dose CT Chest recommended if Age 19-80 years, 30 pack-year currently smoking OR have quit w/in 15years.) does not qualify.     Additional Screening:  Hepatitis C Screening: does qualify; Deferred  Vision Screening: Recommended annual ophthalmology exams for early detection of glaucoma and other disorders of the eye. Is the patient up to date with their annual eye exam?  No  Who is the provider or what is the name of the office in which the patient attends annual eye exams? Deferred If pt is not established with a provider, would they like to be referred to a provider to establish care? No .   Dental Screening: Recommended annual dental exams for proper oral hygiene  Community Resource Referral / Chronic Care Management:  CRR required this visit?  No   CCM required this visit?  No      Plan:     I have personally reviewed and noted the following in the patient's chart:   Medical and social history Use of alcohol, tobacco or illicit drugs  Current medications and supplements including opioid prescriptions. Patient is not currently taking opioid prescriptions. Functional ability and status Nutritional status Physical activity Advanced directives List of other physicians Hospitalizations, surgeries, and ER visits in previous 12 months Vitals Screenings to include cognitive, depression, and falls Referrals and appointments  In addition, I have reviewed and  discussed with patient certain preventive protocols, quality metrics, and best practice recommendations. A written personalized care plan for preventive services as well as general preventive health recommendations were provided to patient.     [name], LPN   12/17/2128   Nurse Notes: Patient due Hep-C Screening

## 2022-12-21 NOTE — Addendum Note (Signed)
Addended by: Saralyn Pilar on: 12/21/2022 02:39 PM   Modules accepted: Orders

## 2022-12-26 DIAGNOSIS — D225 Melanocytic nevi of trunk: Secondary | ICD-10-CM | POA: Diagnosis not present

## 2022-12-26 DIAGNOSIS — L905 Scar conditions and fibrosis of skin: Secondary | ICD-10-CM | POA: Diagnosis not present

## 2022-12-27 ENCOUNTER — Other Ambulatory Visit: Payer: Self-pay | Admitting: Nurse Practitioner

## 2022-12-27 DIAGNOSIS — B353 Tinea pedis: Secondary | ICD-10-CM

## 2022-12-27 DIAGNOSIS — B351 Tinea unguium: Secondary | ICD-10-CM

## 2022-12-29 ENCOUNTER — Ambulatory Visit (INDEPENDENT_AMBULATORY_CARE_PROVIDER_SITE_OTHER): Payer: Medicare HMO | Admitting: Family Medicine

## 2022-12-29 ENCOUNTER — Encounter: Payer: Self-pay | Admitting: Family Medicine

## 2022-12-29 VITALS — BP 131/83 | HR 64 | Resp 18 | Ht 66.5 in | Wt 200.0 lb

## 2022-12-29 DIAGNOSIS — F411 Generalized anxiety disorder: Secondary | ICD-10-CM | POA: Diagnosis not present

## 2022-12-29 DIAGNOSIS — Z1159 Encounter for screening for other viral diseases: Secondary | ICD-10-CM

## 2022-12-29 NOTE — Assessment & Plan Note (Signed)
PHQ-9 score of 3, GAD-7 score of 9.  Stable from last appointment.  We discussed options of waiting an additional 6 weeks to see if side effects resolve and/or anxiety improves on the highest dose versus going back to the 25 mg dose now.  Patient would like to give it some more time.  Continue Zoloft 50 mg daily.  Follow-up in 6 weeks.  If no symptomatic relief, return to Zoloft 25 mg daily.

## 2022-12-29 NOTE — Progress Notes (Signed)
Established Patient Office Visit  Subjective   Patient ID: Cindy Figueroa, female    DOB: Apr 09, 1951  Age: 72 y.o. MRN: [id number]  Chief Complaint  Patient presents with   Anxiety   Depression    HPI Cindy Figueroa is a 72 y.o. female presenting today for follow up of mood. Mood: Patient is here to follow up for anxiety, currently managing with Zoloft 50 mg daily. Taking medication as prescribed, reports excellent compliance with treatment.  Endorses 2 episodes of dizziness and a slight increase in racing thoughts.  Denies mood changes or SI/HI. She feels mood is stable since last visit, she has not noticed a significant improvement.     12/29/2022   10:44 AM 12/21/2022    1:45 PM 11/17/2022   10:56 AM  Depression screen PHQ 2/9  Decreased Interest 0 0 1  Down, Depressed, Hopeless 1 0 1  PHQ - 2 Score 1 0 2  Altered sleeping 0 0 0  Tired, decreased energy 0 0 1  Change in appetite 1 0 1  Feeling bad or failure about yourself  1 0 0  Trouble concentrating 0 0 0  Moving slowly or fidgety/restless 0 0 0  Suicidal thoughts 0 0 0  PHQ-9 Score 3 0 4  Difficult doing work/chores Not difficult at all Not difficult at all Not difficult at all       12/29/2022   10:44 AM 11/17/2022   10:56 AM 10/06/2022   10:32 AM 09/18/2022    3:37 PM  GAD 7 : Generalized Anxiety Score  Nervous, Anxious, on Edge 2 2 3 2   Control/stop worrying 2 2 3 2   Worry too much - different things 2 2 3 2   Trouble relaxing 1 1 0 2  Restless 0 0 0 0  Easily annoyed or irritable 0 0 0 1  Afraid - awful might happen 2 2 2 2   Total GAD 7 Score 9 9 11 11   Anxiety Difficulty Not difficult at all Not difficult at all Not difficult at all    ROS Negative unless otherwise noted in HPI   Objective:     BP 131/83 (BP Location: Left Arm, Patient Position: Sitting, Cuff Size: Large)   Pulse 64   Resp 18   Ht 5' 6.5" (1.689 m)   Wt 200 lb (90.7 kg)   SpO2 97%   BMI 31.80 kg/m   Physical  Exam Constitutional:      General: She is not in acute distress.    Appearance: Normal appearance.  HENT:     Head: Normocephalic and atraumatic.  Cardiovascular:     Rate and Rhythm: Regular rhythm.     Pulses: Normal pulses.     Heart sounds: No murmur heard.    No friction rub. No gallop.  Pulmonary:     Effort: Pulmonary effort is normal. No respiratory distress.     Breath sounds: No wheezing, rhonchi or rales.  Musculoskeletal:     Cervical back: Normal range of motion.  Skin:    General: Skin is warm and dry.  Neurological:     General: No focal deficit present.     Mental Status: She is alert and oriented to person, place, and time. Mental status is at baseline.  Psychiatric:        Mood and Affect: Mood normal.        Thought Content: Thought content normal.        Judgment: Judgment normal.  Assessment & Plan:  GAD (generalized anxiety disorder) Assessment & Plan: PHQ-9 score of 3, GAD-7 score of 9.  Stable from last appointment.  We discussed options of waiting an additional 6 weeks to see if side effects resolve and/or anxiety improves on the highest dose versus going back to the 25 mg dose now.  Patient would like to give it some more time.  Continue Zoloft 50 mg daily.  Follow-up in 6 weeks.  If no symptomatic relief, return to Zoloft 25 mg daily.  Orders: -     Comprehensive metabolic panel; Future  Screening for viral disease -     Hepatitis C antibody  Collecting CMP for medication increase, screening for hepatitis C as discussed at AWV visit.  She has been eating a more unhealthy diet recently and is planning on making changes to decrease her sweets and carbs because of her cholesterol levels.  We will recheck cholesterol in a few months.  Return in about 6 weeks (around 02/09/2023) for follow-up for anxiety, dizzy/HA on Zoloft.    Melida Quitter, PA

## 2022-12-30 LAB — COMPREHENSIVE METABOLIC PANEL
ALT: 15 IU/L (ref 0–32)
AST: 14 IU/L (ref 0–40)
Albumin/Globulin Ratio: 2.5 — ABNORMAL HIGH (ref 1.2–2.2)
Albumin: 4.5 g/dL (ref 3.8–4.8)
Alkaline Phosphatase: 80 IU/L (ref 44–121)
BUN/Creatinine Ratio: 19 (ref 12–28)
BUN: 13 mg/dL (ref 8–27)
Bilirubin Total: 0.6 mg/dL (ref 0.0–1.2)
CO2: 23 mmol/L (ref 20–29)
Calcium: 9.4 mg/dL (ref 8.7–10.3)
Chloride: 104 mmol/L (ref 96–106)
Creatinine, Ser: 0.67 mg/dL (ref 0.57–1.00)
Globulin, Total: 1.8 g/dL (ref 1.5–4.5)
Glucose: 95 mg/dL (ref 70–99)
Potassium: 4.3 mmol/L (ref 3.5–5.2)
Sodium: 140 mmol/L (ref 134–144)
Total Protein: 6.3 g/dL (ref 6.0–8.5)
eGFR: 93 mL/min/{1.73_m2} (ref >59)

## 2022-12-30 LAB — HEPATITIS C ANTIBODY: Hep C Virus Ab: NONREACTIVE

## 2023-01-10 ENCOUNTER — Telehealth: Payer: Self-pay | Admitting: *Deleted

## 2023-01-10 DIAGNOSIS — H5789 Other specified disorders of eye and adnexa: Secondary | ICD-10-CM

## 2023-01-10 NOTE — Addendum Note (Signed)
Addended by: Saralyn Pilar on: 01/10/2023 12:10 PM   Modules accepted: Orders

## 2023-01-10 NOTE — Telephone Encounter (Signed)
While it is possible that it is a side effect of the Zoloft, it is important that we rule out anything else that is going on.  She needs to see an ophthalmologist as soon as possible. I have put in an urgent referral.

## 2023-01-10 NOTE — Telephone Encounter (Signed)
Pt calling stating that on Sat she woke up and 3/4 of her eye was bloody red.  She said that there was no impairment and no pain.  She said that it is getting better and that she saw that this could be a side effect of the zoloft.  She said she has appointment in June and that provider wanted her to stay on this medication for another month to see if the side effects got better.  She said she would discuss this at that visit but just wanted to update PCP to see if there is any concerns in reference to this side effect.

## 2023-01-17 NOTE — Telephone Encounter (Signed)
Informed pt of below and she said that her eyes have cleared up and that she would go to the eye doctor but not right now due to other appointments.

## 2023-01-28 ENCOUNTER — Other Ambulatory Visit: Payer: Self-pay | Admitting: Family Medicine

## 2023-02-09 ENCOUNTER — Ambulatory Visit (INDEPENDENT_AMBULATORY_CARE_PROVIDER_SITE_OTHER): Payer: Medicare HMO | Admitting: Family Medicine

## 2023-02-09 ENCOUNTER — Encounter: Payer: Self-pay | Admitting: Family Medicine

## 2023-02-09 VITALS — BP 102/67 | HR 80 | Resp 18 | Ht 66.5 in | Wt 200.0 lb

## 2023-02-09 DIAGNOSIS — F411 Generalized anxiety disorder: Secondary | ICD-10-CM

## 2023-02-09 DIAGNOSIS — I10 Essential (primary) hypertension: Secondary | ICD-10-CM | POA: Diagnosis not present

## 2023-02-09 NOTE — Assessment & Plan Note (Addendum)
PHQ-9 score of 2, GAD-7 score of 2.  Slightly improved from last appointment even off of the Zoloft.  Continue to monitor, in the future if needed can restart Zoloft or try different SSRI given that Zoloft caused racing thoughts, dizziness, diarrhea.

## 2023-02-09 NOTE — Progress Notes (Signed)
Established Patient Office Visit  Subjective   Patient ID: Cindy Figueroa, female    DOB: November 11, 1950  Age: 73 y.o. MRN: [id number]  Chief Complaint  Patient presents with   Anxiety   Dizziness    HPI Cindy Figueroa is a 72 y.o. female presenting today for follow up of anxiety. Currently managing with Zoloft. At last appointment, endorsed some dizziness, diarrhea, and slight increase in racing thoughts.  Discussed continuing Zoloft 50 mg daily, if no symptomatic relief return to Zoloft 25 mg daily dose.  She decided to wean herself off of the Zoloft completely and has not taken it in about 2 weeks.  She states that she feels a lot better and has had some relief in some of the situations were causing her anxiety.  She would like to try to stay off of it for now.     02/09/2023   10:42 AM 12/29/2022   10:44 AM 12/21/2022    1:45 PM  Depression screen PHQ 2/9  Decreased Interest 0 0 0  Down, Depressed, Hopeless 0 1 0  PHQ - 2 Score 0 1 0  Altered sleeping 1 0 0  Tired, decreased energy 0 0 0  Change in appetite 1 1 0  Feeling bad or failure about yourself  0 1 0  Trouble concentrating 0 0 0  Moving slowly or fidgety/restless 0 0 0  Suicidal thoughts 0 0 0  PHQ-9 Score 2 3 0  Difficult doing work/chores Not difficult at all Not difficult at all Not difficult at all      02/09/2023   10:42 AM 12/29/2022   10:44 AM 11/17/2022   10:56 AM 10/06/2022   10:32 AM  GAD 7 : Generalized Anxiety Score  Nervous, Anxious, on Edge 0 2 2 3   Control/stop worrying 1 2 2 3   Worry too much - different things 1 2 2 3   Trouble relaxing 0 1 1 0  Restless 0 0 0 0  Easily annoyed or irritable 0 0 0 0  Afraid - awful might happen 0 2 2 2   Total GAD 7 Score 2 9 9 11   Anxiety Difficulty Not difficult at all Not difficult at all Not difficult at all Not difficult at all   ROS Negative unless otherwise noted in HPI   Objective:     BP 102/67 (BP Location: Left Arm, Patient Position: Sitting,  Cuff Size: Large)   Pulse 80   Resp 18   Ht 5' 6.5" (1.689 m)   Wt 200 lb (90.7 kg)   SpO2 97%   BMI 31.80 kg/m   Physical Exam Constitutional:      General: She is not in acute distress.    Appearance: Normal appearance.  HENT:     Head: Normocephalic and atraumatic.  Pulmonary:     Effort: Pulmonary effort is normal. No respiratory distress.  Musculoskeletal:     Cervical back: Normal range of motion.  Neurological:     General: No focal deficit present.     Mental Status: She is alert and oriented to person, place, and time. Mental status is at baseline.  Psychiatric:        Mood and Affect: Mood normal.        Thought Content: Thought content normal.        Judgment: Judgment normal.     Assessment & Plan:  GAD (generalized anxiety disorder) Assessment & Plan: PHQ-9 score of 2, GAD-7 score of 2.  Slightly improved  from last appointment even off of the Zoloft.  Continue to monitor, in the future if needed can restart Zoloft or try different SSRI given that Zoloft caused racing thoughts, dizziness, diarrhea.   Hypertension, unspecified type Assessment & Plan: BP goal <130/80.  Above goal initially 158/85, on recheck 102/67 and at goal.  Continue losartan 100 mg daily.  Will continue to monitor.     Return in about 4 months (around 06/11/2023) for follow-up for HTN, HLD, mood, fasting blood work 1 week before.   Melida Quitter, PA

## 2023-02-09 NOTE — Assessment & Plan Note (Addendum)
BP goal <130/80.  Above goal initially 158/85, on recheck 102/67 and at goal.  Continue losartan 100 mg daily.  Will continue to monitor.

## 2023-02-09 NOTE — Patient Instructions (Signed)
Continue vitamin D 5000 units daily.  If you do find in the future that you are starting to get constipated, you can decrease to taking it every other day or get a new dose of 2000 units daily.

## 2023-02-17 ENCOUNTER — Other Ambulatory Visit: Payer: Self-pay | Admitting: Family Medicine

## 2023-02-18 ENCOUNTER — Other Ambulatory Visit: Payer: Self-pay | Admitting: Family Medicine

## 2023-02-18 DIAGNOSIS — F411 Generalized anxiety disorder: Secondary | ICD-10-CM

## 2023-02-19 ENCOUNTER — Telehealth: Payer: Self-pay | Admitting: *Deleted

## 2023-02-19 NOTE — Telephone Encounter (Signed)
Discontinued Zoloft.

## 2023-02-19 NOTE — Telephone Encounter (Signed)
Pt calling requesting refill on losartan, she said [facility] told her that they have reached out to [place] and have not heard back.  Please send refill she said she only has one pill left.    [id number] - [place], [place] - [address]

## 2023-02-20 DIAGNOSIS — F411 Generalized anxiety disorder: Secondary | ICD-10-CM | POA: Diagnosis not present

## 2023-02-20 DIAGNOSIS — R011 Cardiac murmur, unspecified: Secondary | ICD-10-CM | POA: Diagnosis not present

## 2023-02-20 DIAGNOSIS — M4800 Spinal stenosis, site unspecified: Secondary | ICD-10-CM | POA: Diagnosis not present

## 2023-02-20 DIAGNOSIS — Z8616 Personal history of COVID-19: Secondary | ICD-10-CM | POA: Diagnosis not present

## 2023-02-20 DIAGNOSIS — I10 Essential (primary) hypertension: Secondary | ICD-10-CM | POA: Diagnosis not present

## 2023-02-20 DIAGNOSIS — M5440 Lumbago with sciatica, unspecified side: Secondary | ICD-10-CM | POA: Diagnosis not present

## 2023-03-05 DIAGNOSIS — M5416 Radiculopathy, lumbar region: Secondary | ICD-10-CM | POA: Diagnosis not present

## 2023-03-23 ENCOUNTER — Other Ambulatory Visit: Payer: Self-pay | Admitting: Family Medicine

## 2023-03-23 DIAGNOSIS — B351 Tinea unguium: Secondary | ICD-10-CM

## 2023-03-23 DIAGNOSIS — B353 Tinea pedis: Secondary | ICD-10-CM

## 2023-04-09 DIAGNOSIS — M5416 Radiculopathy, lumbar region: Secondary | ICD-10-CM | POA: Diagnosis not present

## 2023-04-11 ENCOUNTER — Other Ambulatory Visit: Payer: Self-pay | Admitting: *Deleted

## 2023-04-11 DIAGNOSIS — M5416 Radiculopathy, lumbar region: Secondary | ICD-10-CM

## 2023-04-27 ENCOUNTER — Ambulatory Visit
Admission: RE | Admit: 2023-04-27 | Discharge: 2023-04-27 | Disposition: A | Discharge status: Home / Self Care | Payer: Medicare HMO | Source: Ambulatory Visit | Attending: *Deleted | Admitting: *Deleted

## 2023-04-27 DIAGNOSIS — M5416 Radiculopathy, lumbar region: Secondary | ICD-10-CM

## 2023-04-28 ENCOUNTER — Other Ambulatory Visit: Payer: Self-pay | Admitting: Family Medicine

## 2023-04-28 DIAGNOSIS — F411 Generalized anxiety disorder: Secondary | ICD-10-CM

## 2023-04-28 DIAGNOSIS — E559 Vitamin D deficiency, unspecified: Secondary | ICD-10-CM

## 2023-05-17 DIAGNOSIS — M5416 Radiculopathy, lumbar region: Secondary | ICD-10-CM | POA: Diagnosis not present

## 2023-05-31 ENCOUNTER — Other Ambulatory Visit: Payer: Self-pay

## 2023-05-31 DIAGNOSIS — E669 Obesity, unspecified: Secondary | ICD-10-CM

## 2023-05-31 DIAGNOSIS — E785 Hyperlipidemia, unspecified: Secondary | ICD-10-CM

## 2023-05-31 DIAGNOSIS — I10 Essential (primary) hypertension: Secondary | ICD-10-CM

## 2023-06-06 ENCOUNTER — Other Ambulatory Visit: Payer: Medicare HMO

## 2023-06-06 DIAGNOSIS — I10 Essential (primary) hypertension: Secondary | ICD-10-CM

## 2023-06-06 DIAGNOSIS — E669 Obesity, unspecified: Secondary | ICD-10-CM

## 2023-06-06 DIAGNOSIS — E785 Hyperlipidemia, unspecified: Secondary | ICD-10-CM

## 2023-06-07 LAB — CBC WITH DIFFERENTIAL/PLATELET
Basophils Absolute: 0 10*3/uL (ref 0.0–0.2)
Basos: 1 %
EOS (ABSOLUTE): 0.2 10*3/uL (ref 0.0–0.4)
Eos: 4 %
Hematocrit: 44.3 % (ref 34.0–46.6)
Hemoglobin: 13.6 g/dL (ref 11.1–15.9)
Immature Grans (Abs): 0 10*3/uL (ref 0.0–0.1)
Immature Granulocytes: 0 %
Lymphocytes Absolute: 1.9 10*3/uL (ref 0.7–3.1)
Lymphs: 31 %
MCH: 27.9 pg (ref 26.6–33.0)
MCHC: 30.7 g/dL — ABNORMAL LOW (ref 31.5–35.7)
MCV: 91 fL (ref 79–97)
Monocytes Absolute: 0.4 10*3/uL (ref 0.1–0.9)
Monocytes: 6 %
Neutrophils Absolute: 3.6 10*3/uL (ref 1.4–7.0)
Neutrophils: 58 %
Platelets: 291 10*3/uL (ref 150–450)
RBC: 4.87 x10E6/uL (ref 3.77–5.28)
RDW: 12.6 % (ref 11.7–15.4)
WBC: 6.1 10*3/uL (ref 3.4–10.8)

## 2023-06-11 DIAGNOSIS — M5416 Radiculopathy, lumbar region: Secondary | ICD-10-CM | POA: Diagnosis not present

## 2023-06-13 ENCOUNTER — Ambulatory Visit: Payer: Medicare HMO | Admitting: Family Medicine

## 2023-06-24 ENCOUNTER — Other Ambulatory Visit: Payer: Self-pay | Admitting: Family Medicine

## 2023-06-24 DIAGNOSIS — B353 Tinea pedis: Secondary | ICD-10-CM

## 2023-06-24 DIAGNOSIS — B351 Tinea unguium: Secondary | ICD-10-CM

## 2023-07-03 ENCOUNTER — Encounter: Payer: Self-pay | Admitting: Family Medicine

## 2023-07-03 ENCOUNTER — Ambulatory Visit (INDEPENDENT_AMBULATORY_CARE_PROVIDER_SITE_OTHER): Payer: Medicare HMO | Admitting: Family Medicine

## 2023-07-03 VITALS — BP 118/72 | HR 75 | Resp 18 | Ht 66.5 in | Wt 195.0 lb

## 2023-07-03 DIAGNOSIS — F411 Generalized anxiety disorder: Secondary | ICD-10-CM | POA: Diagnosis not present

## 2023-07-03 DIAGNOSIS — I10 Essential (primary) hypertension: Secondary | ICD-10-CM | POA: Diagnosis not present

## 2023-07-03 DIAGNOSIS — E785 Hyperlipidemia, unspecified: Secondary | ICD-10-CM

## 2023-07-03 MED ORDER — LOSARTAN POTASSIUM 100 MG PO TABS: 100.0000 mg | ORAL_TABLET | Freq: Every day | ORAL | 0 refills | Status: DC

## 2023-07-03 NOTE — Patient Instructions (Signed)
Over the next few months, do your best to limit trans/saturated fats, foods and drinks high in sugar, and get routine physical activity.  We will recheck your cholesterol at that time and if needed can start ezetimibe.   Calcium + vitamin D: total of 1200 mg of calcium daily and 800 international units daily of vitamin D.  If you eat a very calcium rich diet you may be able to obtain that without a supplement.  If not, then I recommend calcium 500 mg twice a day.  There are several products over-the-counter such as Caltrate D and Viactiv chews which are great options that contain calcium and vitamin D.

## 2023-07-03 NOTE — Assessment & Plan Note (Addendum)
BP goal <130/80.  Stable, at goal.  Continue losartan 100 mg daily.  Will continue to monitor.

## 2023-07-03 NOTE — Assessment & Plan Note (Addendum)
Last lipid panel: LDL 144, HDL 54, triglycerides 64. The 10-year ASCVD risk score (Arnett DK, et al., 2019) is: 14.9%, it is recommended to start medication.  Patient has previously tried and failed statin medications due to intolerable myalgias.  States that her mother has a similar intolerance of statin medications.  She would like to make significant changes to her snacking and recheck cholesterol levels.  Discussed that if risk score remains high it will be recommended to start ezetimibe.  Patient verbalized understanding and is agreeable to this plan.

## 2023-07-03 NOTE — Progress Notes (Signed)
Established Patient Office Visit  Subjective   Patient ID: Cindy Figueroa, female    DOB: 05/25/1951  Age: 72 y.o. MRN: [id number]  Chief Complaint  Patient presents with   Anxiety   Hyperlipidemia   Hypertension    HPI Cindy Figueroa is a 72 y.o. female presenting today for follow up of hypertension, hyperlipidemia, mood.  She has also been started on tramadol as needed with the [facility]. Hypertension: Pt denies chest pain, SOB, dizziness, edema, syncope, fatigue or heart palpitations. Taking losartan, reports excellent compliance with treatment. Denies side effects. Hyperlipidemia: Currently consuming a  pescatarian  diet.  The 10-year ASCVD risk score (Arnett DK, et al., 2019) is: 14.9% Mood: Patient is here to follow up for anxiety, currently managing with nonpharmacologic interventions.  She feels mood is fairly stable since last visit.     07/03/2023   11:00 AM 02/09/2023   10:42 AM 12/29/2022   10:44 AM  Depression screen PHQ 2/9  Decreased Interest 0 0 0  Down, Depressed, Hopeless 0 0 1  PHQ - 2 Score 0 0 1  Altered sleeping 0 1 0  Tired, decreased energy 1 0 0  Change in appetite 0 1 1  Feeling bad or failure about yourself  0 0 1  Trouble concentrating 0 0 0  Moving slowly or fidgety/restless 0 0 0  Suicidal thoughts 0 0 0  PHQ-9 Score 1 2 3   Difficult doing work/chores Not difficult at all Not difficult at all Not difficult at all       07/03/2023   11:00 AM 02/09/2023   10:42 AM 12/29/2022   10:44 AM 11/17/2022   10:56 AM  GAD 7 : Generalized Anxiety Score  Nervous, Anxious, on Edge 1 0 2 2  Control/stop worrying 2 1 2 2   Worry too much - different things 1 1 2 2   Trouble relaxing 1 0 1 1  Restless 0 0 0 0  Easily annoyed or irritable 0 0 0 0  Afraid - awful might happen 2 0 2 2  Total GAD 7 Score 7 2 9 9   Anxiety Difficulty Not difficult at all Not difficult at all Not difficult at all Not difficult at all    Outpatient  Medications Prior to Visit  Medication Sig   [DISCONTINUED] losartan (COZAAR) 100 MG tablet TAKE 1 TABLET BY MOUTH EVERY DAY   [DISCONTINUED] fluconazole (DIFLUCAN) 150 MG tablet TAKE 1 TABLET BY MOUTH ONCE. MAY REPEAT IN 3 DAYS AS NEEDED FOR PERSISITENT SYMPTOMS   No facility-administered medications prior to visit.    ROS Negative unless otherwise noted in HPI   Objective:     BP 118/72 (BP Location: Left Arm, Patient Position: Sitting, Cuff Size: Normal)   Pulse 75   Resp 18   Ht 5' 6.5" (1.689 m)   Wt 195 lb (88.5 kg)   SpO2 100%   BMI 31.00 kg/m   Physical Exam Constitutional:      General: She is not in acute distress.    Appearance: Normal appearance.  HENT:     Head: Normocephalic and atraumatic.  Cardiovascular:     Rate and Rhythm: Normal rate and regular rhythm.     Heart sounds: No murmur heard.    No friction rub. No gallop.  Pulmonary:     Effort: Pulmonary effort is normal. No respiratory distress.     Breath sounds: No wheezing, rhonchi or rales.  Skin:    General: Skin  is warm and dry.  Neurological:     Mental Status: She is alert and oriented to person, place, and time.     Assessment & Plan:  Hypertension, unspecified type Assessment & Plan: BP goal <130/80.  Stable, at goal.  Continue losartan 100 mg daily.  Will continue to monitor.  Orders: -     Losartan Potassium; Take 1 tablet (100 mg total) by mouth daily.  Dispense: 90 tablet; Refill: 0  Hyperlipidemia, unspecified hyperlipidemia type Assessment & Plan: Last lipid panel: LDL 144, HDL 54, triglycerides 64. The 10-year ASCVD risk score (Arnett DK, et al., 2019) is: 14.9%, it is recommended to start medication.  Patient has previously tried and failed statin medications due to intolerable myalgias.  States that her mother has a similar intolerance of statin medications.  She would like to make significant changes to her snacking and recheck cholesterol levels.  Discussed that if risk score  remains high it will be recommended to start ezetimibe.  Patient verbalized understanding and is agreeable to this plan.   GAD (generalized anxiety disorder) Assessment & Plan: Patient reports mood is fairly stable.  Will continue to monitor.   Provided recommendations for calcium and vitamin D supplements.  Again, discussed recommendation for bone marrow density scan but patient declines.  Recommend discontinuing fluconazole.  She is planning on showing her toenail to her dermatologist at upcoming appointment in the next couple of months.  Return in about 10 weeks (around 09/11/2023) for follow-up for HTN, HLD, fasting blood work 1 week before.    Melida Quitter, PA

## 2023-07-03 NOTE — Assessment & Plan Note (Signed)
Patient reports mood is fairly stable.  Will continue to monitor.

## 2023-07-04 DIAGNOSIS — G894 Chronic pain syndrome: Secondary | ICD-10-CM | POA: Diagnosis not present

## 2023-07-04 DIAGNOSIS — Z79891 Long term (current) use of opiate analgesic: Secondary | ICD-10-CM | POA: Diagnosis not present

## 2023-08-28 ENCOUNTER — Other Ambulatory Visit: Payer: Self-pay

## 2023-08-28 DIAGNOSIS — I10 Essential (primary) hypertension: Secondary | ICD-10-CM

## 2023-09-02 ENCOUNTER — Other Ambulatory Visit: Payer: Self-pay | Admitting: Family Medicine

## 2023-09-02 DIAGNOSIS — F411 Generalized anxiety disorder: Secondary | ICD-10-CM

## 2023-09-04 ENCOUNTER — Other Ambulatory Visit: Payer: Medicare HMO

## 2023-09-04 DIAGNOSIS — I10 Essential (primary) hypertension: Secondary | ICD-10-CM | POA: Diagnosis not present

## 2023-09-05 LAB — CBC WITH DIFFERENTIAL/PLATELET
Basophils Absolute: 0 10*3/uL (ref 0.0–0.2)
Basos: 1 %
EOS (ABSOLUTE): 0.3 10*3/uL (ref 0.0–0.4)
Eos: 5 %
Hematocrit: 41.8 % (ref 34.0–46.6)
Hemoglobin: 13.4 g/dL (ref 11.1–15.9)
Immature Grans (Abs): 0 10*3/uL (ref 0.0–0.1)
Immature Granulocytes: 0 %
Lymphocytes Absolute: 2 10*3/uL (ref 0.7–3.1)
Lymphs: 33 %
MCH: 28.7 pg (ref 26.6–33.0)
MCHC: 32.1 g/dL (ref 31.5–35.7)
MCV: 90 fL (ref 79–97)
Monocytes Absolute: 0.4 10*3/uL (ref 0.1–0.9)
Monocytes: 7 %
Neutrophils Absolute: 3.3 10*3/uL (ref 1.4–7.0)
Neutrophils: 54 %
Platelets: 293 10*3/uL (ref 150–450)
RBC: 4.67 x10E6/uL (ref 3.77–5.28)
RDW: 12.6 % (ref 11.7–15.4)
WBC: 6.1 10*3/uL (ref 3.4–10.8)

## 2023-09-11 ENCOUNTER — Encounter: Payer: Self-pay | Admitting: Family Medicine

## 2023-09-11 ENCOUNTER — Ambulatory Visit (INDEPENDENT_AMBULATORY_CARE_PROVIDER_SITE_OTHER): Payer: Medicare HMO | Admitting: Family Medicine

## 2023-09-11 VITALS — BP 117/76 | HR 66 | Ht 66.5 in | Wt 194.0 lb

## 2023-09-11 DIAGNOSIS — E785 Hyperlipidemia, unspecified: Secondary | ICD-10-CM | POA: Diagnosis not present

## 2023-09-11 DIAGNOSIS — I10 Essential (primary) hypertension: Secondary | ICD-10-CM | POA: Diagnosis not present

## 2023-09-11 NOTE — Assessment & Plan Note (Signed)
Last lipid panel: LDL 144, HDL 54, triglycerides 64. The 10-year ASCVD risk score (Arnett DK, et al., 2019) is: 14.7%, it is recommended to start medication.  Patient has previously tried and failed statin medications due to intolerable myalgias.  States that her mother has a similar intolerance of statin medications. Discussed that if risk score remains high it will be recommended to start ezetimibe.  Continue with lifestyle interventions and recheck lipid panel in 3 months.  Patient verbalized understanding and is agreeable to this plan.

## 2023-09-11 NOTE — Patient Instructions (Addendum)
CALCIUM AND VITAMIN D: -total of 1200 mg of calcium daily.  -minimum total of 800 mg daily.  ACTIVE LIVER: -continue as you have been taking it every 3rd-4th month.  CHOLESTEROL: -we will recheck cholesterol levels in a few months. -until then, continue with the strategies we discussed to lower cholesterol. Stay physically active. Aim for 20 minutes daily of activities that increase your heart rate like walking, dancing, biking, weight lifting, swimming, etc. Eat enough fiber. Aim for 25 g fiber daily. Limit trans and saturated fats in food. If you want to add a supplement, red yeast rice has been helpful in lowering cholesterol levels in some studies.

## 2023-09-11 NOTE — Assessment & Plan Note (Signed)
BP goal <130/80.  Stable, at goal.  Continue losartan 100 mg daily.  Will continue to monitor.

## 2023-09-11 NOTE — Progress Notes (Signed)
   Established Patient Office Visit  Subjective   Patient ID: Cindy Figueroa, female    DOB: [birth year]  Age: 73 y.o. MRN: [id number]  Chief Complaint  Patient presents with   Hypertension   Hyperlipidemia    HPI Cindy Figueroa is a 73 y.o. female presenting today for follow up of hypertension, hyperlipidemia. Hypertension: Pt denies chest pain, SOB, dizziness, edema, syncope, fatigue or heart palpitations. Taking losartan, reports excellent compliance with treatment. Denies side effects. Hyperlipidemia: Currently consuming a general diet.  At her last appointment, discussed making changes to her snacking protein as she would like to avoid statin medications after trying one and developing myalgias. Her mother also has significant statin intolerance. The 10-year ASCVD risk score (Arnett DK, et al., 2019) is: 14.7%   Outpatient Medications Prior to Visit  Medication Sig   losartan (COZAAR) 100 MG tablet Take 1 tablet (100 mg total) by mouth daily.   traMADol (ULTRAM) 50 MG tablet Take 50 mg by mouth every 6 (six) hours as needed.   No facility-administered medications prior to visit.    ROS Negative unless otherwise noted in HPI   Objective:     BP 117/76   Pulse 66   Ht 5' 6.5" (1.689 m)   Wt 194 lb (88 kg)   SpO2 100%   BMI 30.84 kg/m   Physical Exam Constitutional:      General: She is not in acute distress.    Appearance: Normal appearance.  HENT:     Head: Normocephalic and atraumatic.  Cardiovascular:     Rate and Rhythm: Normal rate and regular rhythm.     Heart sounds: No murmur heard.    No friction rub. No gallop.  Pulmonary:     Effort: Pulmonary effort is normal. No respiratory distress.     Breath sounds: No wheezing, rhonchi or rales.  Skin:    General: Skin is warm and dry.  Neurological:     Mental Status: She is alert and oriented to person, place, and time.      Assessment & Plan:  Primary hypertension Assessment & Plan: BP goal  <130/80.  Stable, at goal.  Continue losartan 100 mg daily.  Will continue to monitor.   Hyperlipidemia, unspecified hyperlipidemia type Assessment & Plan: Last lipid panel: LDL 144, HDL 54, triglycerides 64. The 10-year ASCVD risk score (Arnett DK, et al., 2019) is: 14.7%, it is recommended to start medication.  Patient has previously tried and failed statin medications due to intolerable myalgias.  States that her mother has a similar intolerance of statin medications. Discussed that if risk score remains high it will be recommended to start ezetimibe.  Continue with lifestyle interventions and recheck lipid panel in 3 months.  Patient verbalized understanding and is agreeable to this plan.     Return in about 4 months (around 01/09/2024) for follow-up for HLD, fasting labs 1 week before (CMP and lipid panel).    Melida Quitter, PA

## 2023-09-12 DIAGNOSIS — B078 Other viral warts: Secondary | ICD-10-CM | POA: Diagnosis not present

## 2023-09-12 DIAGNOSIS — D225 Melanocytic nevi of trunk: Secondary | ICD-10-CM | POA: Diagnosis not present

## 2023-09-12 DIAGNOSIS — B351 Tinea unguium: Secondary | ICD-10-CM | POA: Diagnosis not present

## 2023-09-12 DIAGNOSIS — L570 Actinic keratosis: Secondary | ICD-10-CM | POA: Diagnosis not present

## 2023-09-12 DIAGNOSIS — X32XXXD Exposure to sunlight, subsequent encounter: Secondary | ICD-10-CM | POA: Diagnosis not present

## 2023-09-12 DIAGNOSIS — L821 Other seborrheic keratosis: Secondary | ICD-10-CM | POA: Diagnosis not present

## 2023-09-20 ENCOUNTER — Encounter: Payer: Self-pay | Admitting: Family Medicine

## 2023-09-21 ENCOUNTER — Encounter: Payer: Self-pay | Admitting: Family Medicine

## 2023-09-29 ENCOUNTER — Other Ambulatory Visit: Payer: Self-pay | Admitting: Family Medicine

## 2023-09-29 DIAGNOSIS — I10 Essential (primary) hypertension: Secondary | ICD-10-CM

## 2023-10-19 ENCOUNTER — Other Ambulatory Visit: Payer: Self-pay | Admitting: Family Medicine

## 2023-10-19 DIAGNOSIS — Z1231 Encounter for screening mammogram for malignant neoplasm of breast: Secondary | ICD-10-CM

## 2023-11-01 DIAGNOSIS — M791 Myalgia, unspecified site: Secondary | ICD-10-CM | POA: Diagnosis not present

## 2023-11-01 DIAGNOSIS — M4726 Other spondylosis with radiculopathy, lumbar region: Secondary | ICD-10-CM | POA: Diagnosis not present

## 2023-11-01 DIAGNOSIS — M5416 Radiculopathy, lumbar region: Secondary | ICD-10-CM | POA: Diagnosis not present

## 2023-11-19 ENCOUNTER — Telehealth: Payer: Self-pay

## 2023-11-19 NOTE — Telephone Encounter (Signed)
 Copied from CRM [id number]. Topic: Clinical - Medication Question >> Nov 19, 2023 11:46 AM Pascal Lux wrote: Reason for CRM: Patient was taking Vitamin D3 - 2000  and now have a multi vitamin with vitamin D3 - 1000 and wants to know if she can take both or should she just take one?    Called pt per PCP it's ok to take both vitamins

## 2023-11-29 ENCOUNTER — Ambulatory Visit

## 2023-11-30 ENCOUNTER — Ambulatory Visit
Admission: RE | Admit: 2023-11-30 | Discharge: 2023-11-30 | Disposition: A | Discharge status: Home / Self Care | Source: Ambulatory Visit | Attending: Family Medicine | Admitting: Family Medicine

## 2023-11-30 DIAGNOSIS — Z1231 Encounter for screening mammogram for malignant neoplasm of breast: Secondary | ICD-10-CM

## 2024-01-01 DIAGNOSIS — D485 Neoplasm of uncertain behavior of skin: Secondary | ICD-10-CM | POA: Diagnosis not present

## 2024-01-01 DIAGNOSIS — Z1283 Encounter for screening for malignant neoplasm of skin: Secondary | ICD-10-CM | POA: Diagnosis not present

## 2024-01-01 DIAGNOSIS — D225 Melanocytic nevi of trunk: Secondary | ICD-10-CM | POA: Diagnosis not present

## 2024-01-02 ENCOUNTER — Other Ambulatory Visit: Payer: Medicare HMO

## 2024-01-03 ENCOUNTER — Other Ambulatory Visit: Payer: Self-pay | Admitting: Family Medicine

## 2024-01-03 DIAGNOSIS — I10 Essential (primary) hypertension: Secondary | ICD-10-CM

## 2024-01-09 ENCOUNTER — Ambulatory Visit: Payer: Medicare HMO | Admitting: Family Medicine

## 2024-01-10 ENCOUNTER — Ambulatory Visit: Payer: Medicare HMO

## 2024-01-10 DIAGNOSIS — Z0000 Encounter for general adult medical examination without abnormal findings: Secondary | ICD-10-CM

## 2024-01-10 NOTE — Progress Notes (Signed)
 Subjective:   Cindy Figueroa is a 73 y.o. who presents for a Medicare Wellness preventive visit.  As a reminder, Annual Wellness Visits don't include a physical exam, and some assessments may be limited, especially if this visit is performed virtually. We may recommend an in-person follow-up visit with your provider if needed.  Visit Complete: Virtual I connected with  Cindy Figueroa on 01/10/24 by a audio enabled telemedicine application and verified that I am speaking with the correct person using two identifiers.  Patient Location: Home  Provider Location: Home Office  I discussed the limitations of evaluation and management by telemedicine. The patient expressed understanding and agreed to proceed.  Vital Signs: Because this visit was a virtual/telehealth visit, some criteria may be missing or patient reported. Any vitals not documented were not able to be obtained and vitals that have been documented are patient reported.  VideoError- Librarian, academic were attempted between this provider and patient, however failed, due to patient having technical difficulties OR patient did not have access to video capability.  We continued and completed visit with audio only.   Persons Participating in Visit: Patient.  AWV Questionnaire: No: Patient Medicare AWV questionnaire was not completed prior to this visit.  Cardiac Risk Factors include: advanced age (>65men, >52 women);dyslipidemia;hypertension     Objective:     Today's Vitals   There is no height or weight on file to calculate BMI.     12/21/2022    1:49 PM 12/26/2016   10:37 AM  Advanced Directives  Does Patient Have a Medical Advance Directive? Yes No  Type of Estate agent of [place];Living will   Copy of Healthcare Power of Attorney in Chart? No - copy requested     Current Medications (verified) Outpatient Encounter Medications as of 01/10/2024  Medication Sig    Cholecalciferol (VITAMIN D3) 25 MCG (1000 UT) CAPS Take 1 capsule by mouth daily.   losartan  (COZAAR ) 100 MG tablet TAKE 1 TABLET BY MOUTH EVERY DAY   Multiple Vitamins-Minerals (MULTIVITAMIN WOMEN PO) Take 1 tablet by mouth daily.   traMADol  (ULTRAM ) 50 MG tablet Take 50 mg by mouth every 6 (six) hours as needed.   No facility-administered encounter medications on file as of 01/10/2024.    Allergies (verified) Ciprofloxacin , Penicillins, and Plavix [clopidogrel bisulfate]   History: Past Medical History:  Diagnosis Date   Hypertension    History reviewed. No pertinent surgical history. Family History  Problem Relation Age of Onset   Hyperlipidemia Mother    Hypertension Mother    Cancer Father    Emphysema Father    Social History   Socioeconomic History   Marital status: Married    Spouse name: Not on file   Number of children: Not on file   Years of education: Not on file   Highest education level: Not on file  Occupational History   Not on file  Tobacco Use   Smoking status: Never    Passive exposure: Never   Smokeless tobacco: Never  Vaping Use   Vaping status: Never Used  Substance and Sexual Activity   Alcohol use: No   Drug use: No   Sexual activity: Not on file  Other Topics Concern   Not on file  Social History Narrative   Not on file   Social Drivers of Health   Financial Resource Strain: Low Risk  (01/10/2024)   Overall Financial Resource Strain (CARDIA)    Difficulty of Paying Living  Expenses: Not hard at all  Food Insecurity: No Food Insecurity (01/10/2024)   Hunger Vital Sign    Worried About Running Out of Food in the Last Year: Never true    Ran Out of Food in the Last Year: Never true  Transportation Needs: No Transportation Needs (01/10/2024)   PRAPARE - Administrator, Civil Service (Medical): No    Lack of Transportation (Non-Medical): No  Physical Activity: Inactive (01/10/2024)   Exercise Vital Sign    Days of Exercise  per Week: 0 days    Minutes of Exercise per Session: 0 min  Stress: No Stress Concern Present (01/10/2024)   [employer] of Occupational Health - Occupational Stress Questionnaire    Feeling of Stress : Not at all  Social Connections: Moderately Integrated (01/10/2024)   Social Connection and Isolation Panel [NHANES]    Frequency of Communication with Friends and Family: Twice a week    Frequency of Social Gatherings with Friends and Family: More than three times a week    Attends Religious Services: More than 4 times per year    Active Member of [employer] or Organizations: No    Attends Banker Meetings: Never    Marital Status: Married    Tobacco Counseling Counseling given: Not Answered    Clinical Intake:  Pre-visit preparation completed: Yes  Pain : No/denies pain     Nutritional Risks: None Diabetes: No  Lab Results  Component Value Date   HGBA1C 5.5 11/17/2022   HGBA1C 5.3 06/17/2021   HGBA1C 5.2 12/14/2020     How often do you need to have someone help you when you read instructions, pamphlets, or other written materials from your doctor or pharmacy?: 1 - Never  Interpreter Needed?: No  Information entered by :: NAllen LPN   Activities of Daily Living     01/10/2024    2:58 PM  In your present state of health, do you have any difficulty performing the following activities:  Hearing? 0  Vision? 0  Difficulty concentrating or making decisions? 0  Walking or climbing stairs? 0  Dressing or bathing? 0  Doing errands, shopping? 0  Preparing Food and eating ? N  Using the Toilet? N  In the past six months, have you accidently leaked urine? N  Do you have problems with loss of bowel control? N  Managing your Medications? N  Managing your Finances? N  Housekeeping or managing your Housekeeping? N    Patient Care Team: Melene Sportsman as PCP - General (Physician Assistant) Denman Fischer, MD (Dermatology) Cloteal Daniels, MD (Orthopedic  Surgery)  Indicate any recent Medical Services you may have received from other than [facility] providers in the past year (date may be approximate).     Assessment:    This is a routine wellness examination for Cindy Figueroa.  Hearing/Vision screen Hearing Screening - Comments:: Denies hearing issues Vision Screening - Comments:: No regular eye exams   Goals Addressed             This Visit's Progress    Patient Stated       01/10/2024, wants to continue losing weight       Depression Screen     01/10/2024    3:12 PM 09/11/2023    9:30 AM 07/03/2023   11:00 AM 02/09/2023   10:42 AM 12/29/2022   10:44 AM 12/21/2022    1:45 PM 11/17/2022   10:56 AM  PHQ 2/9 Scores  PHQ -  2 Score 0 1 0 0 1 0 2  PHQ- 9 Score 1 2 1 2 3  0 4    Fall Risk     01/10/2024    3:11 PM 09/11/2023    9:30 AM 12/21/2022    1:47 PM 11/17/2022   10:56 AM 09/18/2022    3:38 PM  Fall Risk   Falls in the past year? 0 0 1 0 1  Number falls in past yr: 0 0 0 0 1  Injury with Fall? 0 0 1 0 1  Comment   Scraped rt leg. Followed by medical attention    Risk for fall due to : Medication side effect No Fall Risks No Fall Risks History of fall(s)   Follow up Falls evaluation completed;Falls prevention discussed Falls evaluation completed Falls prevention discussed  Falls evaluation completed    MEDICARE RISK AT HOME:  Medicare Risk at Home Any stairs in or around the home?: Yes If so, are there any without handrails?: No Home free of loose throw rugs in walkways, pet beds, electrical cords, etc?: Yes Adequate lighting in your home to reduce risk of falls?: Yes Life alert?: No Use of a cane, walker or w/c?: No Grab bars in the bathroom?: No Shower chair or bench in shower?: Yes Elevated toilet seat or a handicapped toilet?: No  TIMED UP AND GO:  Was the test performed?  No  Cognitive Function: 6CIT completed        01/10/2024    3:14 PM 12/21/2022    1:49 PM 06/20/2021    1:45 PM  6CIT Screen  What Year? 0  points 0 points 0 points  What month? 0 points 0 points 0 points  What time? 3 points 0 points 0 points  Count back from 20 0 points 0 points 0 points  Months in reverse 0 points 0 points 0 points  Repeat phrase 0 points 0 points 0 points  Total Score 3 points 0 points 0 points    Immunizations Immunization History  Administered Date(s) Administered   Tdap 12/26/2016, 10/14/2022   Zoster Recombinant(Shingrix) 12/12/2023    Screening Tests Health Maintenance  Topic Date Due   COVID-19 Vaccine (1 - 2024-25 season) Never done   Pneumonia Vaccine 53+ Years old (1 of 1 - PCV) 07/02/2024 (Originally 04/03/2000)   DEXA SCAN  07/02/2024 (Originally 04/04/2015)   Zoster Vaccines- Shingrix (2 of 2) 02/06/2024   INFLUENZA VACCINE  03/14/2024   Medicare Annual Wellness (AWV)  01/09/2025   MAMMOGRAM  11/29/2025   Colonoscopy  12/29/2027   DTaP/Tdap/Td (3 - Td or Tdap) 10/13/2032   Hepatitis C Screening  Completed   HPV VACCINES  Aged Out   Meningococcal B Vaccine  Aged Out    Health Maintenance  Health Maintenance Due  Topic Date Due   COVID-19 Vaccine (1 - 2024-25 season) Never done   Health Maintenance Items Addressed: Declines covid vaccine. Declines bone density.  Additional Screening:  Vision Screening: Recommended annual ophthalmology exams for early detection of glaucoma and other disorders of the eye.  Dental Screening: Recommended annual dental exams for proper oral hygiene  Community Resource Referral / Chronic Care Management: CRR required this visit?  No   CCM required this visit?  No   Plan:    I have personally reviewed and noted the following in the patient's chart:   Medical and social history Use of alcohol, tobacco or illicit drugs  Current medications and supplements including opioid prescriptions. Patient is not  currently taking opioid prescriptions. Functional ability and status Nutritional status Physical activity Advanced directives List of  other physicians Hospitalizations, surgeries, and ER visits in previous 12 months Vitals Screenings to include cognitive, depression, and falls Referrals and appointments  In addition, I have reviewed and discussed with patient certain preventive protocols, quality metrics, and best practice recommendations. A written personalized care plan for preventive services as well as general preventive health recommendations were provided to patient.   Cindy Beecham, LPN   1/61/0960   After Visit Summary: (Pick Up) Due to this being a telephonic visit, with patients personalized plan was offered to patient and patient has requested to Pick up at office.  Notes: Nothing significant to report at this time.

## 2024-01-10 NOTE — Patient Instructions (Signed)
 Ms. Cindy Figueroa , Thank you for taking time out of your busy schedule to complete your Annual Wellness Visit with me. I enjoyed our conversation and look forward to speaking with you again next year. I, as well as your care team,  appreciate your ongoing commitment to your health goals. Please review the following plan we discussed and let me know if I can assist you in the future. Your Game plan/ To Do List    Referrals: If you haven't heard from the office you've been referred to, please reach out to them at the phone provided.  N/a Follow up Visits: Next Medicare AWV with our clinical staff: 02/05/2025 at 3:00  Have you seen your provider in the last 6 months (3 months if uncontrolled diabetes)? Yes Next Office Visit with your provider: 01/25/2024 at 9:30  Clinician Recommendations:  Aim for 30 minutes of exercise or brisk walking, 6-8 glasses of water, and 5 servings of fruits and vegetables each day.       This is a list of the screening recommended for you and due dates:  Health Maintenance  Topic Date Due   COVID-19 Vaccine (1 - 2024-25 season) Never done   Pneumonia Vaccine (1 of 1 - PCV) 07/02/2024*   DEXA scan (bone density measurement)  07/02/2024*   Zoster (Shingles) Vaccine (2 of 2) 02/06/2024   Flu Shot  03/14/2024   Medicare Annual Wellness Visit  01/09/2025   Mammogram  11/29/2025   Colon Cancer Screening  12/29/2027   DTaP/Tdap/Td vaccine (3 - Td or Tdap) 10/13/2032   Hepatitis C Screening  Completed   HPV Vaccine  Aged Out   Meningitis B Vaccine  Aged Out  *Topic was postponed. The date shown is not the original due date.    Advanced directives: (Copy Requested) Please bring a copy of your health care power of attorney and living will to the office to be added to your chart at your convenience. You can mail to [facility] [address] [place], [place] [address] or email to [email or web address] Advance Care Planning is important because it:  [x]   Makes sure you receive the medical care that is consistent with your values, goals, and preferences  [x]  It provides guidance to your family and loved ones and reduces their decisional burden about whether or not they are making the right decisions based on your wishes.  Follow the link provided in your after visit summary or read over the paperwork we have mailed to you to help you started getting your Advance Directives in place. If you need assistance in completing these, please reach out to us  so that we can help you!  See attachments for Preventive Care and Fall Prevention Tips.

## 2024-01-11 DIAGNOSIS — L988 Other specified disorders of the skin and subcutaneous tissue: Secondary | ICD-10-CM | POA: Diagnosis not present

## 2024-01-11 DIAGNOSIS — D485 Neoplasm of uncertain behavior of skin: Secondary | ICD-10-CM | POA: Diagnosis not present

## 2024-01-16 ENCOUNTER — Other Ambulatory Visit: Payer: Self-pay | Admitting: *Deleted

## 2024-01-16 DIAGNOSIS — E785 Hyperlipidemia, unspecified: Secondary | ICD-10-CM

## 2024-01-16 DIAGNOSIS — I10 Essential (primary) hypertension: Secondary | ICD-10-CM

## 2024-01-16 DIAGNOSIS — E669 Obesity, unspecified: Secondary | ICD-10-CM

## 2024-01-16 DIAGNOSIS — E559 Vitamin D deficiency, unspecified: Secondary | ICD-10-CM

## 2024-01-17 ENCOUNTER — Other Ambulatory Visit

## 2024-01-17 DIAGNOSIS — E559 Vitamin D deficiency, unspecified: Secondary | ICD-10-CM

## 2024-01-17 DIAGNOSIS — E785 Hyperlipidemia, unspecified: Secondary | ICD-10-CM

## 2024-01-17 DIAGNOSIS — I10 Essential (primary) hypertension: Secondary | ICD-10-CM

## 2024-01-17 DIAGNOSIS — E669 Obesity, unspecified: Secondary | ICD-10-CM

## 2024-01-18 ENCOUNTER — Ambulatory Visit: Payer: Self-pay

## 2024-01-18 LAB — CBC WITH DIFFERENTIAL/PLATELET
Basophils Absolute: 0 10*3/uL (ref 0.0–0.2)
Basos: 1 %
EOS (ABSOLUTE): 0.2 10*3/uL (ref 0.0–0.4)
Eos: 3 %
Hematocrit: 41 % (ref 34.0–46.6)
Hemoglobin: 13.4 g/dL (ref 11.1–15.9)
Immature Grans (Abs): 0 10*3/uL (ref 0.0–0.1)
Immature Granulocytes: 0 %
Lymphocytes Absolute: 1.6 10*3/uL (ref 0.7–3.1)
Lymphs: 26 %
MCH: 29.4 pg (ref 26.6–33.0)
MCHC: 32.7 g/dL (ref 31.5–35.7)
MCV: 90 fL (ref 79–97)
Monocytes Absolute: 0.4 10*3/uL (ref 0.1–0.9)
Monocytes: 6 %
Neutrophils Absolute: 4 10*3/uL (ref 1.4–7.0)
Neutrophils: 64 %
Platelets: 279 10*3/uL (ref 150–450)
RBC: 4.56 x10E6/uL (ref 3.77–5.28)
RDW: 13.1 % (ref 11.7–15.4)
WBC: 6.2 10*3/uL (ref 3.4–10.8)

## 2024-01-18 LAB — COMPREHENSIVE METABOLIC PANEL WITH GFR
ALT: 19 IU/L (ref 0–32)
AST: 20 IU/L (ref 0–40)
Albumin: 4.1 g/dL (ref 3.8–4.8)
Alkaline Phosphatase: 75 IU/L (ref 44–121)
BUN/Creatinine Ratio: 17 (ref 12–28)
BUN: 11 mg/dL (ref 8–27)
Bilirubin Total: 0.7 mg/dL (ref 0.0–1.2)
CO2: 23 mmol/L (ref 20–29)
Calcium: 9.2 mg/dL (ref 8.7–10.3)
Chloride: 106 mmol/L (ref 96–106)
Creatinine, Ser: 0.63 mg/dL (ref 0.57–1.00)
Globulin, Total: 1.7 g/dL (ref 1.5–4.5)
Glucose: 91 mg/dL (ref 70–99)
Potassium: 4.1 mmol/L (ref 3.5–5.2)
Sodium: 140 mmol/L (ref 134–144)
Total Protein: 5.8 g/dL — ABNORMAL LOW (ref 6.0–8.5)
eGFR: 94 mL/min/{1.73_m2} (ref >59)

## 2024-01-18 LAB — TSH: TSH: 0.994 u[IU]/mL (ref 0.450–4.500)

## 2024-01-18 LAB — HEMOGLOBIN A1C
Est. average glucose Bld gHb Est-mCnc: 103 mg/dL
Hgb A1c MFr Bld: 5.2 % (ref 4.8–5.6)

## 2024-01-18 LAB — LIPID PANEL
Chol/HDL Ratio: 3.4 ratio (ref 0.0–4.4)
Cholesterol, Total: 185 mg/dL (ref 100–199)
HDL: 54 mg/dL (ref >39)
LDL Chol Calc (NIH): 119 mg/dL — ABNORMAL HIGH (ref 0–99)
Triglycerides: 66 mg/dL (ref 0–149)
VLDL Cholesterol Cal: 12 mg/dL (ref 5–40)

## 2024-01-18 LAB — VITAMIN D 25 HYDROXY (VIT D DEFICIENCY, FRACTURES): Vit D, 25-Hydroxy: 42.9 ng/mL (ref 30.0–100.0)

## 2024-01-25 ENCOUNTER — Ambulatory Visit (INDEPENDENT_AMBULATORY_CARE_PROVIDER_SITE_OTHER)

## 2024-01-25 VITALS — BP 108/70 | HR 63 | Ht 66.5 in | Wt 194.1 lb

## 2024-01-25 DIAGNOSIS — E785 Hyperlipidemia, unspecified: Secondary | ICD-10-CM | POA: Diagnosis not present

## 2024-01-25 DIAGNOSIS — R200 Anesthesia of skin: Secondary | ICD-10-CM | POA: Diagnosis not present

## 2024-01-25 DIAGNOSIS — R202 Paresthesia of skin: Secondary | ICD-10-CM | POA: Diagnosis not present

## 2024-01-25 DIAGNOSIS — I10 Essential (primary) hypertension: Secondary | ICD-10-CM | POA: Diagnosis not present

## 2024-01-25 MED ORDER — LOSARTAN POTASSIUM 100 MG PO TABS: 100.0000 mg | ORAL_TABLET | Freq: Every day | ORAL | 0 refills | Status: DC

## 2024-01-25 NOTE — Assessment & Plan Note (Signed)
 Last lipid panel: LDL 119, HDL 54, triglycerides 66.  Lipids are improved from last time. The 10-year ASCVD risk score (Arnett DK, et al., 2019) is: 12.4%.  Patient is not currently on lipid-lowering medications.  Patient has previously tried and failed statin medications due to intolerable myalgias.  Given that lipids have improved with lifestyle interventions, we will plan to recheck lipid panel in 6 months and if still elevated, will initiate Zetia.  Patient verbalized understanding and is in agreement with this plan.

## 2024-01-25 NOTE — Assessment & Plan Note (Signed)
 BP goal <130/80.  Stable and at goal.  Continue losartan  100 mg daily.  Will continue to monitor.

## 2024-01-25 NOTE — Patient Instructions (Addendum)
 It was nice to see you today!  As we discussed in clinic   -Continue with your diet and exercise changes, and you have lost 10 pounds since your last visit which is great! - Continue your losartan  for blood pressure and checking your blood pressure at home periodically. - I am pasting a picture of the carpal tunnel splint that you can get from [employer], [facility] or other drugstores.    Lets plan for a follow-up in 6 months with fasting blood work the week before.  It was nice to meet you!  If you have any problems before your next visit feel free to message me via MyChart (minor issues or questions) or call the office, otherwise you may reach out to schedule an office visit.  Thank you! Meryl Acosta, PA-C

## 2024-01-25 NOTE — Progress Notes (Signed)
 Established Patient Office Visit  Subjective   Patient ID: Cindy Figueroa, female    DOB: [birth year]  Age: 73 y.o. MRN: [id number]  Chief Complaint  Patient presents with   Hyperlipidemia    HPI  Cindy Figueroa is a 73 y.o. female who presents to the clinic for follow up on HLD.  Patient not currently on any lipid-lowering medications.  Patient reports that she has been putting forth effort to lose weight and has lost about 10 pounds over the last several weeks.  She is following a vegetarian and vegan diet.  She is also exercising daily.   Denies chest pain, shortness of breath, palpitations, edema.  Patient also complaining of a several month history of numbness and tingling in her right thumb and pointer finger.  She reports that the numbness is worse in the mornings after she wakes up and improves after she shakes her hands out.  She denies pain or stiffness.  Denies injury to the hand.  The 10-year ASCVD risk score (Arnett DK, et al., 2019) is: 12.4%     ROS Per HPI.    Objective:     BP 108/70   Pulse 63   Ht 5' 6.5 (1.689 m)   Wt 194 lb 1.3 oz (88 kg)   SpO2 100%   BMI 30.86 kg/m    Physical Exam Constitutional:      General: She is not in acute distress.    Appearance: Normal appearance.   Cardiovascular:     Rate and Rhythm: Normal rate and regular rhythm.     Heart sounds: Normal heart sounds. No murmur heard.    No friction rub. No gallop.  Pulmonary:     Effort: Pulmonary effort is normal. No respiratory distress.     Breath sounds: Normal breath sounds.   Musculoskeletal:        General: No swelling.     Right hand: Normal.     Left hand: Normal.     Comments: Tinel's and Phalen's test negative.   Skin:    General: Skin is warm and dry.   Neurological:     General: No focal deficit present.     Mental Status: She is alert.   Psychiatric:        Mood and Affect: Mood normal.        Behavior: Behavior normal.        Thought  Content: Thought content normal.     No results found for any visits on 01/25/24.  Last CBC Lab Results  Component Value Date   WBC 6.2 01/17/2024   HGB 13.4 01/17/2024   HCT 41.0 01/17/2024   MCV 90 01/17/2024   MCH 29.4 01/17/2024   RDW 13.1 01/17/2024   PLT 279 01/17/2024   Last metabolic panel Lab Results  Component Value Date   GLUCOSE 91 01/17/2024   NA 140 01/17/2024   K 4.1 01/17/2024   CL 106 01/17/2024   CO2 23 01/17/2024   BUN 11 01/17/2024   CREATININE 0.63 01/17/2024   EGFR 94 01/17/2024   CALCIUM 9.2 01/17/2024   PROT 5.8 (L) 01/17/2024   ALBUMIN 4.1 01/17/2024   LABGLOB 1.7 01/17/2024   AGRATIO 2.5 (H) 12/29/2022   BILITOT 0.7 01/17/2024   ALKPHOS 75 01/17/2024   AST 20 01/17/2024   ALT 19 01/17/2024   Last lipids Lab Results  Component Value Date   CHOL 185 01/17/2024   HDL 54 01/17/2024   LDLCALC 119 (H) 01/17/2024  TRIG 66 01/17/2024   CHOLHDL 3.4 01/17/2024   Last hemoglobin A1c Lab Results  Component Value Date   HGBA1C 5.2 01/17/2024   Last thyroid  functions Lab Results  Component Value Date   TSH 0.994 01/17/2024      The 10-year ASCVD risk score (Arnett DK, et al., 2019) is: 12.4%    Assessment & Plan:   Hypertension, unspecified type Assessment & Plan: BP goal <130/80.  Stable and at goal.  Continue losartan  100 mg daily.  Will continue to monitor.  Orders: -     Losartan  Potassium; Take 1 tablet (100 mg total) by mouth daily.  Dispense: 90 tablet; Refill: 0  Hyperlipidemia, unspecified hyperlipidemia type Assessment & Plan: Last lipid panel: LDL 119, HDL 54, triglycerides 66.  Lipids are improved from last time. The 10-year ASCVD risk score (Arnett DK, et al., 2019) is: 12.4%.  Patient is not currently on lipid-lowering medications.  Patient has previously tried and failed statin medications due to intolerable myalgias.  Given that lipids have improved with lifestyle interventions, we will plan to recheck lipid panel  in 6 months and if still elevated, will initiate Zetia.  Patient verbalized understanding and is in agreement with this plan.   Numbness and tingling of right thumb Assessment & Plan: Given description and timing of symptoms, suspect carpal tunnel syndrome of right hand.  Provided patient with recommendations for carpal tunnel splint to wear at nighttime while sleeping.  Advised patient that if wearing the splint after several weeks does not improve her symptoms, I can refer her for nerve conduction study to confirm carpal tunnel and explore carpal tunnel release surgery as a potential option.     Return in about 6 months (around 07/26/2024) for HTN, HLD.    Odilia Bennett, PA-C

## 2024-01-25 NOTE — Assessment & Plan Note (Signed)
 Given description and timing of symptoms, suspect carpal tunnel syndrome of right hand.  Provided patient with recommendations for carpal tunnel splint to wear at nighttime while sleeping.  Advised patient that if wearing the splint after several weeks does not improve her symptoms, I can refer her for nerve conduction study to confirm carpal tunnel and explore carpal tunnel release surgery as a potential option.

## 2024-03-05 DIAGNOSIS — G8929 Other chronic pain: Secondary | ICD-10-CM | POA: Diagnosis not present

## 2024-03-05 DIAGNOSIS — M791 Myalgia, unspecified site: Secondary | ICD-10-CM | POA: Diagnosis not present

## 2024-03-05 DIAGNOSIS — M48062 Spinal stenosis, lumbar region with neurogenic claudication: Secondary | ICD-10-CM | POA: Diagnosis not present

## 2024-03-05 DIAGNOSIS — M5441 Lumbago with sciatica, right side: Secondary | ICD-10-CM | POA: Diagnosis not present

## 2024-03-05 DIAGNOSIS — M5442 Lumbago with sciatica, left side: Secondary | ICD-10-CM | POA: Diagnosis not present

## 2024-06-03 DIAGNOSIS — B351 Tinea unguium: Secondary | ICD-10-CM | POA: Diagnosis not present

## 2024-06-03 DIAGNOSIS — L905 Scar conditions and fibrosis of skin: Secondary | ICD-10-CM | POA: Diagnosis not present

## 2024-06-03 DIAGNOSIS — Z1283 Encounter for screening for malignant neoplasm of skin: Secondary | ICD-10-CM | POA: Diagnosis not present

## 2024-06-03 DIAGNOSIS — D225 Melanocytic nevi of trunk: Secondary | ICD-10-CM | POA: Diagnosis not present

## 2024-07-17 ENCOUNTER — Other Ambulatory Visit: Payer: Self-pay

## 2024-07-17 DIAGNOSIS — E669 Obesity, unspecified: Secondary | ICD-10-CM

## 2024-07-17 DIAGNOSIS — I10 Essential (primary) hypertension: Secondary | ICD-10-CM

## 2024-07-17 DIAGNOSIS — E785 Hyperlipidemia, unspecified: Secondary | ICD-10-CM

## 2024-07-17 DIAGNOSIS — E559 Vitamin D deficiency, unspecified: Secondary | ICD-10-CM

## 2024-07-19 ENCOUNTER — Other Ambulatory Visit: Payer: Self-pay

## 2024-07-19 DIAGNOSIS — I10 Essential (primary) hypertension: Secondary | ICD-10-CM

## 2024-07-21 ENCOUNTER — Other Ambulatory Visit

## 2024-07-21 DIAGNOSIS — I10 Essential (primary) hypertension: Secondary | ICD-10-CM

## 2024-07-21 DIAGNOSIS — E785 Hyperlipidemia, unspecified: Secondary | ICD-10-CM

## 2024-07-21 DIAGNOSIS — E559 Vitamin D deficiency, unspecified: Secondary | ICD-10-CM

## 2024-07-21 DIAGNOSIS — E669 Obesity, unspecified: Secondary | ICD-10-CM

## 2024-07-22 ENCOUNTER — Ambulatory Visit: Payer: Self-pay

## 2024-07-22 LAB — LIPID PANEL
Chol/HDL Ratio: 3.4 ratio (ref 0.0–4.4)
Cholesterol, Total: 180 mg/dL (ref 100–199)
HDL: 53 mg/dL (ref >39)
LDL Chol Calc (NIH): 114 mg/dL — ABNORMAL HIGH (ref 0–99)
Triglycerides: 67 mg/dL (ref 0–149)
VLDL Cholesterol Cal: 13 mg/dL (ref 5–40)

## 2024-07-22 LAB — CBC WITH DIFFERENTIAL/PLATELET
Basophils Absolute: 0 x10E3/uL (ref 0.0–0.2)
Basos: 1 %
EOS (ABSOLUTE): 0.2 x10E3/uL (ref 0.0–0.4)
Eos: 3 %
Hematocrit: 41 % (ref 34.0–46.6)
Hemoglobin: 13.1 g/dL (ref 11.1–15.9)
Immature Grans (Abs): 0 x10E3/uL (ref 0.0–0.1)
Immature Granulocytes: 0 %
Lymphocytes Absolute: 1.4 x10E3/uL (ref 0.7–3.1)
Lymphs: 24 %
MCH: 29 pg (ref 26.6–33.0)
MCHC: 32 g/dL (ref 31.5–35.7)
MCV: 91 fL (ref 79–97)
Monocytes Absolute: 0.4 x10E3/uL (ref 0.1–0.9)
Monocytes: 7 %
Neutrophils Absolute: 3.9 x10E3/uL (ref 1.4–7.0)
Neutrophils: 65 %
Platelets: 289 x10E3/uL (ref 150–450)
RBC: 4.52 x10E6/uL (ref 3.77–5.28)
RDW: 12.2 % (ref 11.7–15.4)
WBC: 6 x10E3/uL (ref 3.4–10.8)

## 2024-07-22 LAB — COMPREHENSIVE METABOLIC PANEL WITH GFR
ALT: 14 IU/L (ref 0–32)
AST: 14 IU/L (ref 0–40)
Albumin: 4.1 g/dL (ref 3.8–4.8)
Alkaline Phosphatase: 78 IU/L (ref 49–135)
BUN/Creatinine Ratio: 17 (ref 12–28)
BUN: 12 mg/dL (ref 8–27)
Bilirubin Total: 0.8 mg/dL (ref 0.0–1.2)
CO2: 22 mmol/L (ref 20–29)
Calcium: 9.5 mg/dL (ref 8.7–10.3)
Chloride: 104 mmol/L (ref 96–106)
Creatinine, Ser: 0.72 mg/dL (ref 0.57–1.00)
Globulin, Total: 1.6 g/dL (ref 1.5–4.5)
Glucose: 84 mg/dL (ref 70–99)
Potassium: 4.2 mmol/L (ref 3.5–5.2)
Sodium: 139 mmol/L (ref 134–144)
Total Protein: 5.7 g/dL — ABNORMAL LOW (ref 6.0–8.5)
eGFR: 88 mL/min/1.73 (ref >59)

## 2024-07-22 LAB — HEMOGLOBIN A1C
Est. average glucose Bld gHb Est-mCnc: 103 mg/dL
Hgb A1c MFr Bld: 5.2 % (ref 4.8–5.6)

## 2024-07-22 LAB — TSH: TSH: 1.1 u[IU]/mL (ref 0.450–4.500)

## 2024-07-22 LAB — VITAMIN D 25 HYDROXY (VIT D DEFICIENCY, FRACTURES): Vit D, 25-Hydroxy: 43.8 ng/mL (ref 30.0–100.0)

## 2024-07-28 ENCOUNTER — Ambulatory Visit (INDEPENDENT_AMBULATORY_CARE_PROVIDER_SITE_OTHER)

## 2024-07-28 VITALS — BP 113/74 | HR 62 | Temp 97.7°F | Ht 66.5 in | Wt 186.0 lb

## 2024-07-28 DIAGNOSIS — F411 Generalized anxiety disorder: Secondary | ICD-10-CM | POA: Diagnosis not present

## 2024-07-28 DIAGNOSIS — I10 Essential (primary) hypertension: Secondary | ICD-10-CM | POA: Diagnosis not present

## 2024-07-28 DIAGNOSIS — E785 Hyperlipidemia, unspecified: Secondary | ICD-10-CM

## 2024-07-28 MED ORDER — FLUOXETINE HCL 10 MG PO CAPS: 10.0000 mg | ORAL_CAPSULE | Freq: Every day | ORAL | 1 refills | Status: AC

## 2024-07-28 NOTE — Assessment & Plan Note (Signed)
 BP goal <130/80.  Stable and at goal.  Continue losartan  100 mg daily.  Will continue to monitor.

## 2024-07-28 NOTE — Assessment & Plan Note (Signed)
 Experiencing anxiety and mood changes due to caregiving stress. Previous sertraline  trial ineffective. Interested in fluoxetine , discussed potential side effects and dosing. - Prescribed fluoxetine  at bedtime, starting at lowest dose. - Advised to monitor and report side effects - Instructed to increase dose to 20 mg at night after 1-2 weeks if no side effects

## 2024-07-28 NOTE — Patient Instructions (Signed)
 VISIT SUMMARY: Today, we discussed your situational anxiety and mood changes due to the stress of caring for your mother, as well as your overall health including hyperlipidemia, hypertension, and general health maintenance.  YOUR PLAN: DEPRESSIVE DISORDER AND SITUATIONAL ANXIETY: You are experiencing anxiety and mood changes due to the stress of caregiving. Previous medication was ineffective. -Start taking fluoxetine  at bedtime, beginning with the lowest dose. -Monitor and report any side effects. -If no side effects and no improvement after 1-2 weeks, increase the dose to 20 mg at night.  HYPERLIPIDEMIA: Your cholesterol has improved due to weight loss and lifestyle changes. You are not on statins due to intolerance. -Continue monitoring your cholesterol levels. -Consider using Zetia if your cholesterol levels increase or do not reach targets.  HYPERTENSION: Your blood pressure is well controlled. -Your antihypertensive medication has been renewed for three months.  GENERAL HEALTH MAINTENANCE: Your A1c is well controlled at 5.2. Your weight has decreased from 194 lbs to 180 lbs, with a goal of 140 lbs. Your cholesterol has improved with lifestyle changes. -Continue your lifestyle modifications for weight loss and cholesterol management.  If you have any problems before your next visit feel free to message me via MyChart (minor issues or questions) or call the office, otherwise you may reach out to schedule an office visit.  Thank you! Saddie Sacks, PA-C

## 2024-07-28 NOTE — Assessment & Plan Note (Signed)
 Last lipid panel: LDL 114, HDL 53, Trig 67. The 10-year ASCVD risk score (Arnett DK, et al., 2019) is: 15% Patient previously on statin medication but had to stop due to myalgia. Discussed that her lipid panel showed mildly improved LDL with her weight loss efforts, but that her overall cardiovascular risk remains elevated. Patient declines offer to initiate Zetia at this time. Risks and benefits discussed including higher risk of heart attack and stroke. If LDL has increased from current at next OV, patient agreeable to starting Zetia for primary prevention.

## 2024-07-28 NOTE — Progress Notes (Signed)
 Established Patient Office Visit  Subjective   Patient ID: Cindy Figueroa, female    DOB: 04/08/1951  Age: 73 y.o. MRN: [id number]  Chief Complaint  Patient presents with   Medical Management of Chronic Issues    HPI History of Present Illness   Cindy Figueroa is a 73 year old female who presents with situational anxiety and mood changes. Also to follow up on HTN and HLD.   Situational anxiety and mood changes - Experiencing anxiety and mood changes attributed to the stress of caring for her 64 year old mother following her mother's recent shoulder fracture. - Heightened nervousness due to increased dependency of her mother. - Considering medication to help manage anxiety and mood changes. - Previously used sertraline , which was ineffective and caused side effects.   Weight loss and dietary changes - Vegetarian diet. - Weight reduced from 248 pounds to 184 pounds through lifestyle changes, including discontinuing [employer]. - Goal weight is 140 pounds. - Intends to maintain weight loss without medication.     ROS Per HPI.    Objective:     BP 113/74   Pulse 62   Temp 97.7 F (36.5 C) (Oral)   Ht 5' 6.5 (1.689 m)   Wt 186 lb 0.6 oz (84.4 kg)   SpO2 100%   BMI 29.58 kg/m    Physical Exam Constitutional:      General: She is not in acute distress.    Appearance: Normal appearance.  Cardiovascular:     Rate and Rhythm: Normal rate and regular rhythm.     Heart sounds: Normal heart sounds. No murmur heard.    No friction rub. No gallop.  Pulmonary:     Effort: Pulmonary effort is normal. No respiratory distress.     Breath sounds: Normal breath sounds.  Musculoskeletal:        General: No swelling.  Skin:    General: Skin is warm and dry.  Neurological:     General: No focal deficit present.     Mental Status: She is alert.  Psychiatric:        Mood and Affect: Mood normal.        Behavior: Behavior normal.        Thought Content: Thought  content normal.      No results found for any visits on 07/28/24.  Last CBC Lab Results  Component Value Date   WBC 6.0 07/21/2024   HGB 13.1 07/21/2024   HCT 41.0 07/21/2024   MCV 91 07/21/2024   MCH 29.0 07/21/2024   RDW 12.2 07/21/2024   PLT 289 07/21/2024   Last metabolic panel Lab Results  Component Value Date   GLUCOSE 84 07/21/2024   NA 139 07/21/2024   K 4.2 07/21/2024   CL 104 07/21/2024   CO2 22 07/21/2024   BUN 12 07/21/2024   CREATININE 0.72 07/21/2024   EGFR 88 07/21/2024   CALCIUM 9.5 07/21/2024   PROT 5.7 (L) 07/21/2024   ALBUMIN 4.1 07/21/2024   LABGLOB 1.6 07/21/2024   AGRATIO 2.5 (H) 12/29/2022   BILITOT 0.8 07/21/2024   ALKPHOS 78 07/21/2024   AST 14 07/21/2024   ALT 14 07/21/2024   Last lipids Lab Results  Component Value Date   CHOL 180 07/21/2024   HDL 53 07/21/2024   LDLCALC 114 (H) 07/21/2024   TRIG 67 07/21/2024   CHOLHDL 3.4 07/21/2024   Last hemoglobin A1c Lab Results  Component Value Date   HGBA1C 5.2 07/21/2024   Last thyroid   functions Lab Results  Component Value Date   TSH 1.100 07/21/2024   Last vitamin D  Lab Results  Component Value Date   VD25OH 43.8 07/21/2024      The 10-year ASCVD risk score (Arnett DK, et al., 2019) is: 15%    Assessment & Plan:   Hyperlipidemia, unspecified hyperlipidemia type Assessment & Plan: Last lipid panel: LDL 114, HDL 53, Trig 67. The 10-year ASCVD risk score (Arnett DK, et al., 2019) is: 15% Patient previously on statin medication but had to stop due to myalgia. Discussed that her lipid panel showed mildly improved LDL with her weight loss efforts, but that her overall cardiovascular risk remains elevated. Patient declines offer to initiate Zetia at this time. Risks and benefits discussed including higher risk of heart attack and stroke. If LDL has increased from current at next OV, patient agreeable to starting Zetia for primary prevention.    GAD (generalized anxiety  disorder) Assessment & Plan: Experiencing anxiety and mood changes due to caregiving stress. Previous sertraline  trial ineffective. Interested in fluoxetine , discussed potential side effects and dosing. - Prescribed fluoxetine  at bedtime, starting at lowest dose. - Advised to monitor and report side effects - Instructed to increase dose to 20 mg at night after 1-2 weeks if no side effects    Primary hypertension Assessment & Plan: BP goal <130/80. Stable and at goal. Continue losartan  100 mg daily. Will continue to monitor.    Other orders -     FLUoxetine  HCl; Take 1 capsule (10 mg total) by mouth at bedtime.  Dispense: 90 capsule; Refill: 1    Return in about 3 months (around 10/26/2024) for Mood, HTN.    Saddie JULIANNA Sacks, PA-C

## 2024-10-27 ENCOUNTER — Ambulatory Visit

## 2025-02-05 ENCOUNTER — Ambulatory Visit
